# Patient Record
Sex: Female | Born: 1995 | Race: Black or African American | Hispanic: Yes | Smoking: Former smoker
Health system: Southern US, Community
[De-identification: ages and names within clinical notes are randomized; demographics above are authoritative.]

## PROBLEM LIST (undated history)

## (undated) DIAGNOSIS — F32A Depression, unspecified: Secondary | ICD-10-CM

## (undated) DIAGNOSIS — F329 Major depressive disorder, single episode, unspecified: Secondary | ICD-10-CM

## (undated) DIAGNOSIS — R87629 Unspecified abnormal cytological findings in specimens from vagina: Secondary | ICD-10-CM

## (undated) DIAGNOSIS — E119 Type 2 diabetes mellitus without complications: Secondary | ICD-10-CM

## (undated) DIAGNOSIS — R7303 Prediabetes: Secondary | ICD-10-CM

## (undated) HISTORY — PX: TONSILLECTOMY: SUR1361

## (undated) HISTORY — PX: ADENOIDECTOMY: SUR15

## (undated) HISTORY — DX: Unspecified abnormal cytological findings in specimens from vagina: R87.629

---
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
## 2017-06-01 ENCOUNTER — Encounter (HOSPITAL_COMMUNITY): Payer: Self-pay

## 2017-06-01 ENCOUNTER — Emergency Department (HOSPITAL_COMMUNITY)
Admission: EM | Admit: 2017-06-01 | Discharge: 2017-06-01 | Disposition: A | Discharge status: Home / Self Care | Payer: Medicaid Other | Attending: Emergency Medicine | Admitting: Emergency Medicine

## 2017-06-01 ENCOUNTER — Other Ambulatory Visit: Payer: Self-pay

## 2017-06-01 DIAGNOSIS — Z3202 Encounter for pregnancy test, result negative: Secondary | ICD-10-CM | POA: Insufficient documentation

## 2017-06-01 DIAGNOSIS — Z3200 Encounter for pregnancy test, result unknown: Secondary | ICD-10-CM

## 2017-06-01 LAB — I-STAT BETA HCG BLOOD, ED (MC, WL, AP ONLY): I-stat hCG, quantitative: <5 m[IU]/mL (ref <5)

## 2017-06-01 LAB — PREGNANCY, URINE: PREG TEST UR: NEGATIVE

## 2017-06-01 NOTE — ED Provider Notes (Signed)
[facility] EMERGENCY DEPARTMENT Provider Note   CSN: [id number] Arrival date & time: 06/01/17  1253     History   Chief Complaint Chief Complaint  Patient presents with  . Possible Pregnancy    HPI Leah Kennedy is a 21 y.o. female.  Patient is a 21 year old female who presents to the emergency department with a question of pregnancy.  Patient states that her cycle is about 11 days late.  She states she has taken pregnancy test that was positive from over-the-counter and a pregnancy test that was negative from over-the-counter so she came to the emergency department for evaluation.  She has not had any lower abdominal pain.  There is been no vaginal bleeding reported.  She states she has had some clear drainage from her vagina but no bleeding.      History reviewed. No pertinent past medical history.  There are no active problems to display for this patient.   History reviewed. No pertinent surgical history.  OB History    No data available       Home Medications    Prior to Admission medications   Not on File    Family History No family history on file.  Social History Social History   Tobacco Use  . Smoking status: Never Smoker  . Smokeless tobacco: Never Used  Substance Use Topics  . Alcohol use: No    Frequency: Never  . Drug use: No     Allergies   Patient has no known allergies.   Review of Systems Review of Systems  Constitutional: Negative for activity change.       All ROS Neg except as noted in HPI  HENT: Negative for nosebleeds.   Eyes: Negative for photophobia and discharge.  Respiratory: Negative for cough, shortness of breath and wheezing.   Cardiovascular: Negative for chest pain and palpitations.  Gastrointestinal: Positive for nausea. Negative for abdominal pain and blood in stool.  Genitourinary: Negative for dysuria, frequency and hematuria.  Musculoskeletal: Negative for arthralgias, back pain and neck pain.  Skin:  Negative.   Neurological: Negative for dizziness, seizures and speech difficulty.  Psychiatric/Behavioral: Negative for confusion and hallucinations.     Physical Exam Updated Vital Signs BP 139/84 (BP Location: Right Arm)   Pulse 94   Temp 97.8 F (36.6 C) (Oral)   Resp 15   Ht 5\' 6"  (1.676 m)   Wt 122.9 kg (271 lb)   LMP 04/24/2017   SpO2 98%   BMI 43.74 kg/m   Physical Exam  Constitutional: She is oriented to person, place, and time. She appears well-developed and well-nourished.  Non-toxic appearance.  HENT:  Head: Normocephalic.  Right Ear: Tympanic membrane and external ear normal.  Left Ear: Tympanic membrane and external ear normal.  Eyes: EOM and lids are normal. Pupils are equal, round, and reactive to light.  Neck: Normal range of motion. Neck supple. Carotid bruit is not present.  Cardiovascular: Normal rate, regular rhythm, normal heart sounds, intact distal pulses and normal pulses.  Pulmonary/Chest: Breath sounds normal. No respiratory distress.  Abdominal: Soft. Bowel sounds are normal. There is no tenderness. There is no guarding.  Musculoskeletal: Normal range of motion.  Lymphadenopathy:       Head (right side): No submandibular adenopathy present.       Head (left side): No submandibular adenopathy present.    She has no cervical adenopathy.  Neurological: She is alert and oriented to person, place, and time. She has normal strength. No  cranial nerve deficit or sensory deficit.  Skin: Skin is warm and dry.  Psychiatric: She has a normal mood and affect. Her speech is normal.  Nursing note and vitals reviewed.    ED Treatments / Results  Labs (all labs ordered are listed, but only abnormal results are displayed) Labs Reviewed  PREGNANCY, URINE    EKG  EKG Interpretation None       Radiology No results found.  Procedures Procedures (including critical care time)  Medications Ordered in ED Medications - No data to display   Initial  Impression / Assessment and Plan / ED Course  I have reviewed the triage vital signs and the nursing notes.  Pertinent labs & imaging results that were available during my care of the patient were reviewed by me and considered in my medical decision making (see chart for details).  Clinical Course as of Jun 01 1613  Fri Jun 01, 2017  1440 Preg Test, Ur: NEGATIVE [HM]    Clinical Course User Index [HM] Estill DoomsMcDonald, Hillary W, Wisconsintudent-PA      Final Clinical Impressions(s) / ED Diagnoses MDM Vital signs within normal limits.  Urine pregnancy test is negative.  Point-of-care beta hCG is negative.  I have informed the patient of negative pregnancy test.  She will follow-up with her GYN concerning the changes in her menstrual cycle.   Final diagnoses:  Encounter for pregnancy test, result unknown    ED Discharge Orders    None       Ivery QualeBryant, Laure Leone, PA-C 06/01/17 1617    Eber HongMiller, Brian, MD 06/02/17 [phone]

## 2017-06-01 NOTE — Discharge Instructions (Signed)
Your urine and blood pregnancy test are negative.

## 2017-06-01 NOTE — ED Triage Notes (Signed)
Patient reports of taking sseveral pregnancy test with positive and negative. States she wants to know if she is pregnant. Denies any complains. Last period was 04/24/17.

## 2017-07-24 ENCOUNTER — Other Ambulatory Visit: Payer: Self-pay

## 2017-07-24 ENCOUNTER — Encounter (HOSPITAL_COMMUNITY): Payer: Self-pay | Admitting: *Deleted

## 2017-07-24 ENCOUNTER — Emergency Department (HOSPITAL_COMMUNITY)
Admission: EM | Admit: 2017-07-24 | Discharge: 2017-07-24 | Disposition: A | Discharge status: Home / Self Care | Payer: Medicaid Other | Attending: Emergency Medicine | Admitting: Emergency Medicine

## 2017-07-24 DIAGNOSIS — Z5321 Procedure and treatment not carried out due to patient leaving prior to being seen by health care provider: Secondary | ICD-10-CM | POA: Insufficient documentation

## 2017-07-24 DIAGNOSIS — N938 Other specified abnormal uterine and vaginal bleeding: Secondary | ICD-10-CM | POA: Diagnosis present

## 2017-07-24 NOTE — ED Notes (Signed)
Pt not in waiting room

## 2017-07-24 NOTE — ED Triage Notes (Signed)
Vaginal bleeding, states she is [redacted] weeks pregnant

## 2017-07-24 NOTE — ED Notes (Signed)
No answer

## 2017-10-20 ENCOUNTER — Encounter (HOSPITAL_COMMUNITY): Payer: Self-pay | Admitting: Emergency Medicine

## 2017-10-20 ENCOUNTER — Emergency Department (HOSPITAL_COMMUNITY)
Admission: EM | Admit: 2017-10-20 | Discharge: 2017-10-20 | Disposition: A | Discharge status: Home / Self Care | Payer: Medicaid Other | Attending: Emergency Medicine | Admitting: Emergency Medicine

## 2017-10-20 ENCOUNTER — Other Ambulatory Visit: Payer: Self-pay

## 2017-10-20 ENCOUNTER — Emergency Department (HOSPITAL_COMMUNITY): Payer: Medicaid Other

## 2017-10-20 DIAGNOSIS — Y929 Unspecified place or not applicable: Secondary | ICD-10-CM | POA: Diagnosis not present

## 2017-10-20 DIAGNOSIS — S93401A Sprain of unspecified ligament of right ankle, initial encounter: Secondary | ICD-10-CM | POA: Insufficient documentation

## 2017-10-20 DIAGNOSIS — Y999 Unspecified external cause status: Secondary | ICD-10-CM | POA: Insufficient documentation

## 2017-10-20 DIAGNOSIS — S99911A Unspecified injury of right ankle, initial encounter: Secondary | ICD-10-CM | POA: Diagnosis present

## 2017-10-20 DIAGNOSIS — X509XXA Other and unspecified overexertion or strenuous movements or postures, initial encounter: Secondary | ICD-10-CM | POA: Diagnosis not present

## 2017-10-20 DIAGNOSIS — Y939 Activity, unspecified: Secondary | ICD-10-CM | POA: Diagnosis not present

## 2017-10-20 MED ORDER — IBUPROFEN 600 MG PO TABS: 600.0000 mg | ORAL_TABLET | Freq: Four times a day (QID) | ORAL | 0 refills | Status: DC | PRN

## 2017-10-20 NOTE — ED Triage Notes (Signed)
Patient states she rolled her right ankle today and is complaining of pain and swelling to area.

## 2017-10-20 NOTE — ED Provider Notes (Signed)
[facility] EMERGENCY DEPARTMENT Provider Note   CSN: [id number] Arrival date & time: 10/20/17  1502     History   Chief Complaint Chief Complaint  Patient presents with  . Ankle Pain    HPI Leah Kennedy is a 22 y.o. female.  HPI   Leah Kennedy is a 22 y.o. female who presents to the Emergency Department complaining of right ankle pain.  She describes a "twisting" of her right ankle that occurred earlier today.  She reports pain associated with weightbearing and movement.  She also notes gradual swelling to the lateral ankle.  She denies head injury, LOC, neck or back pain.  She has not tried any medications or therapies prior to arrival.   History reviewed. No pertinent past medical history.  There are no active problems to display for this patient.   Past Surgical History:  Procedure Laterality Date  . TONSILLECTOMY       OB History   None      Home Medications    Prior to Admission medications   Not on File    Family History History reviewed. No pertinent family history.  Social History Social History   Tobacco Use  . Smoking status: Never Smoker  . Smokeless tobacco: Never Used  Substance Use Topics  . Alcohol use: No    Frequency: Never  . Drug use: No     Allergies   Patient has no known allergies.   Review of Systems Review of Systems  Constitutional: Negative for chills and fever.  Genitourinary: Negative for difficulty urinating and dysuria.  Musculoskeletal: Positive for arthralgias (Right ankle pain and swelling) and joint swelling.  Skin: Negative for color change and wound.  All other systems reviewed and are negative.    Physical Exam Updated Vital Signs BP 104/78 (BP Location: Right Arm)   Pulse 93   Temp 98.5 F (36.9 C) (Oral)   Resp 18   Ht 5\' 6"  (1.676 m)   Wt 122.5 kg (270 lb)   LMP 09/12/2017   SpO2 100%   BMI 43.58 kg/m   Physical Exam  Constitutional: She is oriented to person, place, and time. She  appears well-developed and well-nourished. No distress.  HENT:  Head: Normocephalic and atraumatic.  Cardiovascular: Normal rate, regular rhythm and intact distal pulses.  Pulmonary/Chest: Effort normal and breath sounds normal.  Musculoskeletal: She exhibits edema and tenderness. She exhibits no deformity.  Tenderness to palpation of the lateral right ankle with mild edema noted.  No bony deformity.  No tenderness or edema proximally.  Achilles tendon appears intact  Neurological: She is alert and oriented to person, place, and time. No sensory deficit. She exhibits normal muscle tone. Coordination normal.  Skin: Skin is warm and dry. Capillary refill takes less than 2 seconds.  Psychiatric: She has a normal mood and affect.  Nursing note and vitals reviewed.    ED Treatments / Results  Labs (all labs ordered are listed, but only abnormal results are displayed) Labs Reviewed - No data to display  EKG None  Radiology Dg Ankle Complete Right  Result Date: 10/20/2017 CLINICAL DATA:  Right ankle pain and swelling after injury today. EXAM: RIGHT ANKLE - COMPLETE 3+ VIEW COMPARISON:  None. FINDINGS: There is no evidence of fracture, dislocation, or joint effusion. There is no evidence of arthropathy or other focal bone abnormality. Soft tissues are unremarkable. IMPRESSION: Normal right ankle. Electronically Signed   By: Lupita Raider, M.D.   On: 10/20/2017 15:32  Procedures Procedures (including critical care time)  Medications Ordered in ED Medications - No data to display   Initial Impression / Assessment and Plan / ED Course  I have reviewed the triage vital signs and the nursing notes.  Pertinent labs & imaging results that were available during my care of the patient were reviewed by me and considered in my medical decision making (see chart for details).     Patient with likely sprain of the right ankle.  Neurovascularly intact.  X-rays are reassuring.  ASO applied,  patient agrees to RICE therapy and orthopedic follow-up in 1 week if not improving.  Final Clinical Impressions(s) / ED Diagnoses   Final diagnoses:  Sprain of right ankle, unspecified ligament, initial encounter    ED Discharge Orders    None       Rosey Bathriplett, Courtland Reas, PA-C 10/20/17 1604    Linwood DibblesKnapp, Jon, MD 10/21/17 0000

## 2017-10-20 NOTE — Discharge Instructions (Addendum)
Elevate and apply ice packs on and off to your ankle.  Minimal weightbearing for 1 week.  Wear the brace for at least 1 week.  Follow-up with the orthopedic doctor listed if the pain is not improving.

## 2017-12-12 ENCOUNTER — Emergency Department (HOSPITAL_COMMUNITY)
Admission: EM | Admit: 2017-12-12 | Discharge: 2017-12-12 | Disposition: A | Discharge status: Home / Self Care | Payer: Medicaid Other | Attending: Emergency Medicine | Admitting: Emergency Medicine

## 2017-12-12 ENCOUNTER — Encounter (HOSPITAL_COMMUNITY): Payer: Self-pay | Admitting: *Deleted

## 2017-12-12 ENCOUNTER — Other Ambulatory Visit: Payer: Self-pay

## 2017-12-12 DIAGNOSIS — R7303 Prediabetes: Secondary | ICD-10-CM | POA: Diagnosis not present

## 2017-12-12 DIAGNOSIS — R350 Frequency of micturition: Secondary | ICD-10-CM | POA: Insufficient documentation

## 2017-12-12 HISTORY — DX: Type 2 diabetes mellitus without complications: E11.9

## 2017-12-12 HISTORY — DX: Major depressive disorder, single episode, unspecified: F32.9

## 2017-12-12 HISTORY — DX: Depression, unspecified: F32.A

## 2017-12-12 LAB — URINALYSIS, ROUTINE W REFLEX MICROSCOPIC
BILIRUBIN URINE: NEGATIVE
Glucose, UA: NEGATIVE mg/dL
HGB URINE DIPSTICK: NEGATIVE
KETONES UR: NEGATIVE mg/dL
NITRITE: NEGATIVE
PROTEIN: NEGATIVE mg/dL
SPECIFIC GRAVITY, URINE: 1.02 (ref 1.005–1.030)
pH: 7 (ref 5.0–8.0)

## 2017-12-12 LAB — CBG MONITORING, ED: Glucose-Capillary: 107 mg/dL — ABNORMAL HIGH (ref 65–99)

## 2017-12-12 LAB — PREGNANCY, URINE: Preg Test, Ur: NEGATIVE

## 2017-12-12 NOTE — ED Provider Notes (Addendum)
[facility] EMERGENCY DEPARTMENT Provider Note   CSN: [id number] Arrival date & time: 12/12/17  2038     History   Chief Complaint Chief Complaint  Patient presents with  . Labs Only    HPI Leah Kennedy is a 22 y.o. female.  She presents to the ED concerned that she may be a diabetic.  She has noticed over the past few weeks that she has had episodic fatigue, feeling hot and cold, and urinating frequently.  She has been told that she was prediabetic in the past.  She has not had a fever she does not have a cough or abdominal pain.  There is been no vaginal bleeding or discharge.  Her last menstrual period was about 2 weeks ago.  She denies any drugs or alcohol.  She states she has been more depressed since her grandfather was ill and passed last month she has a primary care doctor but has not been to see him for this.  The history is provided by the patient.  Female GU Problem  This is a new problem. The problem occurs hourly. The problem has not changed since onset.Pertinent negatives include no chest pain, no abdominal pain, no headaches and no shortness of breath. Nothing aggravates the symptoms. Nothing relieves the symptoms. She has tried nothing for the symptoms. The treatment provided no relief.    Past Medical History:  Diagnosis Date  . Depression   . Diabetes mellitus without complication (HCC)    pre-diabetes    There are no active problems to display for this patient.   Past Surgical History:  Procedure Laterality Date  . TONSILLECTOMY       OB History   None      Home Medications    Prior to Admission medications   Medication Sig Start Date End Date Taking? Authorizing Provider  ibuprofen (ADVIL,MOTRIN) 600 MG tablet Take 1 tablet (600 mg total) by mouth every 6 (six) hours as needed. Take with food 10/20/17   Triplett, Tammy, PA-C    Family History No family history on file.  Social History Social History   Tobacco Use  . Smoking status: Never  Smoker  . Smokeless tobacco: Never Used  Substance Use Topics  . Alcohol use: No    Frequency: Never  . Drug use: No     Allergies   Patient has no known allergies.   Review of Systems Review of Systems  Constitutional: Positive for fatigue. Negative for fever.  HENT: Negative for sore throat.   Eyes: Negative for visual disturbance.  Respiratory: Negative for shortness of breath.   Cardiovascular: Negative for chest pain.  Gastrointestinal: Negative for abdominal pain.  Endocrine: Positive for polydipsia and polyuria.  Genitourinary: Positive for frequency. Negative for dysuria.  Musculoskeletal: Negative for gait problem.  Skin: Negative for rash.  Neurological: Negative for headaches.     Physical Exam Updated Vital Signs BP 123/77 (BP Location: Right Arm)   Pulse (!) 106   Temp 98.4 F (36.9 C) (Oral)   Resp 18   Ht  (1.676 m)   Wt 132.5 kg (292 lb)   LMP 11/26/2017   SpO2 100%   BMI 47.13 kg/m   Physical Exam  Constitutional: She appears well-developed and well-nourished. No distress.  HENT:  Head: Normocephalic and atraumatic.  Eyes: Conjunctivae are normal.  Neck: Neck supple.  Cardiovascular: Normal rate and regular rhythm.  No murmur heard. Pulmonary/Chest: Effort normal and breath sounds normal. No respiratory distress.  Abdominal:  Soft. There is no tenderness.  Musculoskeletal: Normal range of motion. She exhibits no edema or deformity.  Neurological: She is alert. She has normal strength. Gait normal. GCS eye subscore is 4. GCS verbal subscore is 5. GCS motor subscore is 6.  Skin: Skin is warm and dry.  Psychiatric: She has a normal mood and affect.  Nursing note and vitals reviewed.    ED Treatments / Results  Labs (all labs ordered are listed, but only abnormal results are displayed) Labs Reviewed  URINALYSIS, ROUTINE W REFLEX MICROSCOPIC - Abnormal; Notable for the following components:      Result Value   Leukocytes, UA TRACE (*)     Bacteria, UA RARE (*)    All other components within normal limits  CBG MONITORING, ED - Abnormal; Notable for the following components:   Glucose-Capillary 107 (*)    All other components within normal limits  PREGNANCY, URINE  CBG MONITORING, ED    EKG None  Radiology No results found.  Procedures Procedures (including critical care time)  Medications Ordered in ED Medications - No data to display   Initial Impression / Assessment and Plan / ED Course  I have reviewed the triage vital signs and the nursing notes.  Pertinent labs & imaging results that were available during my care of the patient were reviewed by me and considered in my medical decision making (see chart for details).  Clinical Course as of Dec 14 42  Wed Dec 12, 2017  2111 Patient's fingerstick blood sugar here was 107 and so doubtful that she is has new onset diabetes.  She is going to give [place] a urine sample so we can check a UA and UCG.  She denies any vaginal bleeding or discharge and she does not have any concerns for STD.  I did review with her that if her work-up is unremarkable here she should follow-up with her primary care doctor because they may need to do more testing and there may be some elements of a mood disorder to this.   [MB]    Clinical Course User Index [MB] Terrilee Files, MD     Final Clinical Impressions(s) / ED Diagnoses   Final diagnoses:  Urinary frequency    ED Discharge Orders    None       Terrilee Files, MD 12/13/17 1610    Terrilee Files, MD 12/24/17 [phone]

## 2017-12-12 NOTE — Discharge Instructions (Addendum)
You were evaluated in the emergency department for feeling hot and cold and frequent urination.  We checked her blood sugar and there is no evidence that this is high blood sugars like diabetes and your urine did not show any obvious signs of infection.  You will need to contact your primary care doctor to follow-up with them and please return to the emergency department if any worsening symptoms.

## 2017-12-12 NOTE — ED Triage Notes (Addendum)
Pt here to have her cbg checked. Pt concerned she is "full blown" diabetic. Pt states she has been told she is pre-diabetic. Pt state she has been fatigued.

## 2018-05-18 ENCOUNTER — Emergency Department (HOSPITAL_COMMUNITY)
Admission: EM | Admit: 2018-05-18 | Discharge: 2018-05-18 | Disposition: A | Discharge status: Home / Self Care | Payer: Medicaid Other | Attending: Emergency Medicine | Admitting: Emergency Medicine

## 2018-05-18 ENCOUNTER — Encounter (HOSPITAL_COMMUNITY): Payer: Self-pay | Admitting: Emergency Medicine

## 2018-05-18 ENCOUNTER — Emergency Department (HOSPITAL_COMMUNITY): Payer: Medicaid Other

## 2018-05-18 DIAGNOSIS — Y92328 Other athletic field as the place of occurrence of the external cause: Secondary | ICD-10-CM | POA: Diagnosis not present

## 2018-05-18 DIAGNOSIS — Z3201 Encounter for pregnancy test, result positive: Secondary | ICD-10-CM | POA: Diagnosis not present

## 2018-05-18 DIAGNOSIS — Y9302 Activity, running: Secondary | ICD-10-CM | POA: Diagnosis not present

## 2018-05-18 DIAGNOSIS — R7303 Prediabetes: Secondary | ICD-10-CM | POA: Insufficient documentation

## 2018-05-18 DIAGNOSIS — X500XXA Overexertion from strenuous movement or load, initial encounter: Secondary | ICD-10-CM | POA: Diagnosis not present

## 2018-05-18 DIAGNOSIS — S99922A Unspecified injury of left foot, initial encounter: Secondary | ICD-10-CM | POA: Diagnosis present

## 2018-05-18 DIAGNOSIS — Y999 Unspecified external cause status: Secondary | ICD-10-CM | POA: Insufficient documentation

## 2018-05-18 DIAGNOSIS — S9032XA Contusion of left foot, initial encounter: Secondary | ICD-10-CM | POA: Insufficient documentation

## 2018-05-18 DIAGNOSIS — M79672 Pain in left foot: Secondary | ICD-10-CM | POA: Diagnosis not present

## 2018-05-18 LAB — HCG, QUANTITATIVE, PREGNANCY: HCG, BETA CHAIN, QUANT, S: 388 m[IU]/mL — AB (ref <5)

## 2018-05-18 NOTE — ED Notes (Signed)
KS in to speak with pt re findings

## 2018-05-18 NOTE — ED Notes (Signed)
From Rad 

## 2018-05-18 NOTE — Discharge Instructions (Addendum)
Schedule appointment for repeat Qbhcg this week.  Your pregnancy hormone is low.  This could mean that you are very early pregnant or that there is a problem with this pregnancy.

## 2018-05-18 NOTE — ED Notes (Signed)
Call to Rad  They are enroute

## 2018-05-18 NOTE — ED Triage Notes (Signed)
Pt [place] and hurting her [place] foot.  Also has had 5 positive home pregnancy tests and wants to confirm.

## 2018-05-18 NOTE — ED Notes (Signed)
Pt running and states hurt her L foot  Also here to confirm pregnancy after 5 positive home preg test

## 2018-05-19 NOTE — ED Provider Notes (Signed)
[facility] EMERGENCY DEPARTMENT Provider Note   CSN: [id number] Arrival date & time: 05/18/18  1823     History   Chief Complaint Chief Complaint  Patient presents with  . Foot Pain    HPI Leah Kennedy is a 22 y.o. female.  The history is provided by the patient. No language interpreter was used.  Foot Pain  This is a new problem. The current episode started 6 to 12 hours ago. The problem occurs constantly. The problem has been gradually worsening. Nothing aggravates the symptoms. Nothing relieves the symptoms. She has tried nothing for the symptoms.  Pt reports she was running and turned her foot.  Pt also thinks she may be pregannt.  She request a test  Past Medical History:  Diagnosis Date  . Depression   . Diabetes mellitus without complication (HCC)    pre-diabetes    There are no active problems to display for this patient.   Past Surgical History:  Procedure Laterality Date  . TONSILLECTOMY       OB History   None      Home Medications    Prior to Admission medications   Medication Sig Start Date End Date Taking? Authorizing Provider  ibuprofen (ADVIL,MOTRIN) 600 MG tablet Take 1 tablet (600 mg total) by mouth every 6 (six) hours as needed. Take with food 10/20/17   Pauline Aus, PA-C    Family History History reviewed. No pertinent family history.  Social History Social History   Tobacco Use  . Smoking status: Never Smoker  . Smokeless tobacco: Never Used  Substance Use Topics  . Alcohol use: No    Frequency: Never  . Drug use: Yes    Types: Marijuana    Comment: occasional     Allergies   Patient has no known allergies.   Review of Systems Review of Systems  Musculoskeletal: Positive for joint swelling.  All other systems reviewed and are negative.    Physical Exam Updated Vital Signs BP (!) 103/58 (BP Location: Left Arm)   Pulse 99   Temp 98.3 F (36.8 C) (Oral)   Resp 18   LMP 03/29/2018   SpO2 99%   Physical  Exam  Constitutional: She appears well-developed and well-nourished.  Cardiovascular:  Tender left foot, pain with range of motion  Ns nad nv intact  Musculoskeletal: She exhibits tenderness.  Neurological: She is alert.  Skin: Skin is warm.  Psychiatric: She has a normal mood and affect.  Nursing note and vitals reviewed.    ED Treatments / Results  Labs (all labs ordered are listed, but only abnormal results are displayed) Labs Reviewed  HCG, QUANTITATIVE, PREGNANCY - Abnormal; Notable for the following components:      Result Value   hCG, Beta Chain, Quant, S 388 (*)    All other components within normal limits    EKG None  Radiology Dg Foot Complete Left  Result Date: 05/18/2018 CLINICAL DATA:  Foot pain after running a race. EXAM: LEFT FOOT - COMPLETE 3+ VIEW COMPARISON:  None. FINDINGS: There is no evidence of fracture or dislocation. There is no evidence of arthropathy or other focal bone abnormality. Soft tissues are unremarkable. IMPRESSION: No fracture or dislocation of the left foot. Electronically Signed   By: Deatra Robinson M.D.   On: 05/18/2018 21:37    Procedures Procedures (including critical care time)  Medications Ordered in ED Medications - No data to display   Initial Impression / Assessment and Plan / ED Course  I have reviewed the triage vital signs and the nursing notes.  Pertinent labs & imaging results that were available during my care of the patient were reviewed by me and considered in my medical decision making (see chart for details).     Pt had quan hcg .  Pt counseled on positive result.  Pt advised of need for repeat test.  [facility]  Pt advised 48 hour recheck.  Pt counseled on xray results  Final Clinical Impressions(s) / ED Diagnoses   Final diagnoses:  Contusion of left foot, initial encounter    ED Discharge Orders    None       Osie Cheeks 05/19/18 0136    Donnetta Hutching, MD 05/19/18  1546

## 2018-05-20 LAB — POC URINE PREG, ED: Preg Test, Ur: POSITIVE — AB

## 2018-05-28 ENCOUNTER — Encounter: Payer: Self-pay | Admitting: Adult Health

## 2018-06-04 ENCOUNTER — Other Ambulatory Visit: Payer: Self-pay | Admitting: Obstetrics & Gynecology

## 2018-06-04 DIAGNOSIS — O3680X0 Pregnancy with inconclusive fetal viability, not applicable or unspecified: Secondary | ICD-10-CM

## 2018-06-07 ENCOUNTER — Ambulatory Visit (INDEPENDENT_AMBULATORY_CARE_PROVIDER_SITE_OTHER): Payer: Medicaid Other

## 2018-06-07 ENCOUNTER — Other Ambulatory Visit: Payer: Self-pay

## 2018-06-07 DIAGNOSIS — O3680X0 Pregnancy with inconclusive fetal viability, not applicable or unspecified: Secondary | ICD-10-CM

## 2018-06-07 NOTE — Progress Notes (Addendum)
US 7+3 wks,single IUP w/ys,positive fht 137 bpm,normal left ovary,simple right ovarian exophytic cyst 2.2 x1.6 x 1.9 cm,crl 12.26 mm

## 2018-06-30 ENCOUNTER — Other Ambulatory Visit: Payer: Self-pay

## 2018-06-30 ENCOUNTER — Emergency Department (HOSPITAL_COMMUNITY)
Admission: EM | Admit: 2018-06-30 | Discharge: 2018-06-30 | Disposition: A | Discharge status: Home / Self Care | Payer: Medicaid Other | Attending: Emergency Medicine | Admitting: Emergency Medicine

## 2018-06-30 ENCOUNTER — Encounter (HOSPITAL_COMMUNITY): Payer: Self-pay | Admitting: Emergency Medicine

## 2018-06-30 DIAGNOSIS — R1084 Generalized abdominal pain: Secondary | ICD-10-CM | POA: Diagnosis not present

## 2018-06-30 DIAGNOSIS — Z5321 Procedure and treatment not carried out due to patient leaving prior to being seen by health care provider: Secondary | ICD-10-CM | POA: Diagnosis not present

## 2018-06-30 NOTE — ED Triage Notes (Signed)
Pt c/o sharp intermittent abd pain with nausea and dizziness, pt [redacted] wks pregnant, denies d/v/fever

## 2018-06-30 NOTE — ED Notes (Signed)
Per registration pt LWBS

## 2018-07-01 ENCOUNTER — Ambulatory Visit: Payer: Medicaid Other | Admitting: *Deleted

## 2018-07-01 ENCOUNTER — Ambulatory Visit (INDEPENDENT_AMBULATORY_CARE_PROVIDER_SITE_OTHER): Payer: Medicaid Other | Admitting: Women's Health

## 2018-07-01 ENCOUNTER — Encounter (INDEPENDENT_AMBULATORY_CARE_PROVIDER_SITE_OTHER): Payer: Self-pay

## 2018-07-01 ENCOUNTER — Encounter: Payer: Self-pay | Admitting: Women's Health

## 2018-07-01 VITALS — BP 114/80 | HR 70 | Wt 281.0 lb

## 2018-07-01 DIAGNOSIS — R7303 Prediabetes: Secondary | ICD-10-CM | POA: Diagnosis not present

## 2018-07-01 DIAGNOSIS — Z23 Encounter for immunization: Secondary | ICD-10-CM | POA: Diagnosis not present

## 2018-07-01 DIAGNOSIS — Z3A10 10 weeks gestation of pregnancy: Secondary | ICD-10-CM

## 2018-07-01 DIAGNOSIS — Z3401 Encounter for supervision of normal first pregnancy, first trimester: Secondary | ICD-10-CM

## 2018-07-01 DIAGNOSIS — Z331 Pregnant state, incidental: Secondary | ICD-10-CM

## 2018-07-01 DIAGNOSIS — Z3481 Encounter for supervision of other normal pregnancy, first trimester: Secondary | ICD-10-CM

## 2018-07-01 DIAGNOSIS — Z3490 Encounter for supervision of normal pregnancy, unspecified, unspecified trimester: Secondary | ICD-10-CM | POA: Insufficient documentation

## 2018-07-01 DIAGNOSIS — Z3682 Encounter for antenatal screening for nuchal translucency: Secondary | ICD-10-CM

## 2018-07-01 DIAGNOSIS — Z1389 Encounter for screening for other disorder: Secondary | ICD-10-CM

## 2018-07-01 LAB — POCT URINALYSIS DIPSTICK OB
Blood, UA: NEGATIVE
GLUCOSE, UA: NEGATIVE
Leukocytes, UA: NEGATIVE
Nitrite, UA: NEGATIVE

## 2018-07-01 NOTE — Progress Notes (Signed)
INITIAL OBSTETRICAL VISIT Patient name: Leah Kennedy MRN [id number]  Date of birth: [birth year] Chief Complaint:   Initial Prenatal Visit  History of Present Illness:   Leah Kennedy is a 22 y.o. G21P0010 African American female at [redacted]w[redacted]d by 7wk u/s, with an Estimated Date of Delivery: 01/21/19 being seen today for her initial obstetrical visit.   Her obstetrical history is significant for she thinks she had a miscarriage earlier this year, never took pregnancy test, but was hurting all day, passed golf-ball sized sac that was firm- did not look like a blood clot- send pic to her gma who said it was miscarriage.   Today she reports no complaints.  H/O dep/anx- no meds x years, doing well H/O pre-diabetes Patient's last menstrual period was 03/29/2018. Last pap <21yo. Results were: n/a Review of Systems:   Pertinent items are noted in HPI Denies cramping/contractions, leakage of fluid, vaginal bleeding, abnormal vaginal discharge w/ itching/odor/irritation, headaches, visual changes, shortness of breath, chest pain, abdominal pain, severe nausea/vomiting, or problems with urination or bowel movements unless otherwise stated above.  Pertinent History Reviewed:  Reviewed past medical,surgical, social, obstetrical and family history.  Reviewed problem list, medications and allergies. OB History  Gravida Para Term Preterm AB Living  2       1    SAB TAB Ectopic Multiple Live Births  1            # Outcome Date GA Lbr Len/2nd Weight Sex Delivery Anes PTL Lv  2 Current           1 SAB 05/2017           Physical Assessment:   Vitals:   07/01/18 1432  BP: 114/80  Pulse: 70  Weight: 281 lb (127.5 kg)  Body mass index is 45.35 kg/m.       Physical Examination:  General appearance - well appearing, and in no distress  Mental status - alert, oriented to person, place, and time  Psych:  She has a normal mood and affect  Skin - warm and dry, normal color, no suspicious lesions  noted  Chest - effort normal, all lung fields clear to auscultation bilaterally  Heart - normal rate and regular rhythm  Abdomen - soft, nontender  Extremities:  No swelling or varicosities noted  Thin prep pap is not done   Fetal Heart Rate (bpm): 160 via doppler  No results found for this or any previous visit (from the past 24 hour(s)).  Assessment & Plan:  1) Low-Risk Pregnancy G2P0010 at [redacted]w[redacted]d with an Estimated Date of Delivery: 01/21/19   2) Initial OB visit  3) Dep/anx> no meds, doing well  4) Pre-diabetes> A1C today  Meds: No orders of the defined types were placed in this encounter.  Initial labs obtained, including A1C Continue prenatal vitamins Reviewed n/v relief measures and warning s/s to report Reviewed recommended weight gain based on pre-gravid BMI Encouraged well-balanced diet Genetic Screening discussed Integrated Screen: requested Cystic fibrosis screening discussed requested Ultrasound discussed; fetal survey: requested CCNC completed  Follow-up: Return in about 17 days (around 07/18/2018) for LROB, US:NT+1stIT.   Orders Placed This Encounter  Procedures  . GC/Chlamydia Probe Amp  . Urine Culture  . US Fetal Nuchal Translucency Measurement  . Obstetric Panel, Including HIV  . Urinalysis, Routine w reflex microscopic  . Pain Management Screening Profile (10S)  . Cystic Fibrosis Mutation 97  . Hemoglobin A1c  . POC Urinalysis Dipstick OB    Merlene Laughter  Cedric FishmanBooker CNM, [facility]NP-BC 07/01/2018 3:26 PM

## 2018-07-01 NOTE — Patient Instructions (Signed)
Konrad SahaAureana Kooi, I greatly value your feedback.  If you receive a survey following your visit with us today, we appreciate you taking the time to fill it out.  Thanks, Joellyn HaffKim Booker, CNM, WHNP-BC   Nausea & Vomiting  Have saltine crackers or pretzels by your bed and eat a few bites before you raise your head out of bed in the morning  Eat small frequent meals throughout the day instead of large meals  Drink plenty of fluids throughout the day to stay hydrated, just don't drink a lot of fluids with your meals.  This can make your stomach fill up faster making you feel sick  Do not brush your teeth right after you eat  Products with real ginger are good for nausea, like ginger ale and ginger hard candy Make sure it says made with real ginger!  Sucking on sour candy like lemon heads is also good for nausea  If your prenatal vitamins make you nauseated, take them at night so you will sleep through the nausea  Sea Bands  If you feel like you need medicine for the nausea & vomiting please let us know  If you are unable to keep any fluids or food down please let us know   Constipation  Drink plenty of fluid, preferably water, throughout the day  Eat foods high in fiber such as fruits, vegetables, and grains  Exercise, such as walking, is a good way to keep your bowels regular  Drink warm fluids, especially warm prune juice, or decaf coffee  Eat a 1/2 cup of real oatmeal (not instant), 1/2 cup applesauce, and 1/2-1 cup warm prune juice every day  If needed, you may take Colace (docusate sodium) stool softener once or twice a day to help keep the stool soft. If you are pregnant, wait until you are out of your first trimester (12-14 weeks of pregnancy)  If you still are having problems with constipation, you may take Miralax once daily as needed to help keep your bowels regular.  If you are pregnant, wait until you are out of your first trimester (12-14 weeks of pregnancy)   First  Trimester of Pregnancy The first trimester of pregnancy is from week 1 until the end of week 12 (months 1 through 3). A week after a sperm fertilizes an egg, the egg will implant on the wall of the uterus. This embryo will begin to develop into a baby. Genes from you and your partner are forming the baby. The female genes determine whether the baby is a boy or a girl. At 6-8 weeks, the eyes and face are formed, and the heartbeat can be seen on ultrasound. At the end of 12 weeks, all the baby's organs are formed.  Now that you are pregnant, you will want to do everything you can to have a healthy baby. Two of the most important things are to get good prenatal care and to follow your health care provider's instructions. Prenatal care is all the medical care you receive before the baby's birth. This care will help prevent, find, and treat any problems during the pregnancy and childbirth. BODY CHANGES Your body goes through many changes during pregnancy. The changes vary from woman to woman.   You may gain or lose a couple of pounds at first.  You may feel sick to your stomach (nauseous) and throw up (vomit). If the vomiting is uncontrollable, call your health care provider.  You may tire easily.  You may develop headaches that  can be relieved by medicines approved by your health care provider.  You may urinate more often. Painful urination may mean you have a bladder infection.  You may develop heartburn as a result of your pregnancy.  You may develop constipation because certain hormones are causing the muscles that push waste through your intestines to slow down.  You may develop hemorrhoids or swollen, bulging veins (varicose veins).  Your breasts may begin to grow larger and become tender. Your nipples may stick out more, and the tissue that surrounds them (areola) may become darker.  Your gums may bleed and may be sensitive to brushing and flossing.  Dark spots or blotches (chloasma, mask  of pregnancy) may develop on your face. This will likely fade after the baby is born.  Your menstrual periods will stop.  You may have a loss of appetite.  You may develop cravings for certain kinds of food.  You may have changes in your emotions from day to day, such as being excited to be pregnant or being concerned that something may go wrong with the pregnancy and baby.  You may have more vivid and strange dreams.  You may have changes in your hair. These can include thickening of your hair, rapid growth, and changes in texture. Some women also have hair loss during or after pregnancy, or hair that feels dry or thin. Your hair will most likely return to normal after your baby is born. WHAT TO EXPECT AT YOUR PRENATAL VISITS During a routine prenatal visit:  You will be weighed to make sure you and the baby are growing normally.  Your blood pressure will be taken.  Your abdomen will be measured to track your baby's growth.  The fetal heartbeat will be listened to starting around week 10 or 12 of your pregnancy.  Test results from any previous visits will be discussed. Your health care provider may ask you:  How you are feeling.  If you are feeling the baby move.  If you have had any abnormal symptoms, such as leaking fluid, bleeding, severe headaches, or abdominal cramping.  If you have any questions. Other tests that may be performed during your first trimester include:  Blood tests to find your blood type and to check for the presence of any previous infections. They will also be used to check for low iron levels (anemia) and Rh antibodies. Later in the pregnancy, blood tests for diabetes will be done along with other tests if problems develop.  Urine tests to check for infections, diabetes, or protein in the urine.  An ultrasound to confirm the proper growth and development of the baby.  An amniocentesis to check for possible genetic problems.  Fetal screens for spina  bifida and Down syndrome.  You may need other tests to make sure you and the baby are doing well. HOME CARE INSTRUCTIONS  Medicines  Follow your health care provider's instructions regarding medicine use. Specific medicines may be either safe or unsafe to take during pregnancy.  Take your prenatal vitamins as directed.  If you develop constipation, try taking a stool softener if your health care provider approves. Diet  Eat regular, well-balanced meals. Choose a variety of foods, such as meat or vegetable-based protein, fish, milk and low-fat dairy products, vegetables, fruits, and whole grain breads and cereals. Your health care provider will help you determine the amount of weight gain that is right for you.  Avoid raw meat and uncooked cheese. These carry germs that can cause  birth defects in the baby.  Eating four or five small meals rather than three large meals a day may help relieve nausea and vomiting. If you start to feel nauseous, eating a few soda crackers can be helpful. Drinking liquids between meals instead of during meals also seems to help nausea and vomiting.  If you develop constipation, eat more high-fiber foods, such as fresh vegetables or fruit and whole grains. Drink enough fluids to keep your urine clear or pale yellow. Activity and Exercise  Exercise only as directed by your health care provider. Exercising will help you:  Control your weight.  Stay in shape.  Be prepared for labor and delivery.  Experiencing pain or cramping in the lower abdomen or low back is a good sign that you should stop exercising. Check with your health care provider before continuing normal exercises.  Try to avoid standing for long periods of time. Move your legs often if you must stand in one place for a long time.  Avoid heavy lifting.  Wear low-heeled shoes, and practice good posture.  You may continue to have sex unless your health care provider directs you  otherwise. Relief of Pain or Discomfort  Wear a good support bra for breast tenderness.   Take warm sitz baths to soothe any pain or discomfort caused by hemorrhoids. Use hemorrhoid cream if your health care provider approves.   Rest with your legs elevated if you have leg cramps or low back pain.  If you develop varicose veins in your legs, wear support hose. Elevate your feet for 15 minutes, 3-4 times a day. Limit salt in your diet. Prenatal Care  Schedule your prenatal visits by the twelfth week of pregnancy. They are usually scheduled monthly at first, then more often in the last 2 months before delivery.  Write down your questions. Take them to your prenatal visits.  Keep all your prenatal visits as directed by your health care provider. Safety  Wear your seat belt at all times when driving.  Make a list of emergency phone numbers, including numbers for family, friends, the hospital, and police and fire departments. General Tips  Ask your health care provider for a referral to a local prenatal education class. Begin classes no later than at the beginning of month 6 of your pregnancy.  Ask for help if you have counseling or nutritional needs during pregnancy. Your health care provider can offer advice or refer you to specialists for help with various needs.  Do not use hot tubs, steam rooms, or saunas.  Do not douche or use tampons or scented sanitary pads.  Do not cross your legs for long periods of time.  Avoid cat litter boxes and soil used by cats. These carry germs that can cause birth defects in the baby and possibly loss of the fetus by miscarriage or stillbirth.  Avoid all smoking, herbs, alcohol, and medicines not prescribed by your health care provider. Chemicals in these affect the formation and growth of the baby.  Schedule a dentist appointment. At home, brush your teeth with a soft toothbrush and be gentle when you floss. SEEK MEDICAL CARE IF:   You have  dizziness.  You have mild pelvic cramps, pelvic pressure, or nagging pain in the abdominal area.  You have persistent nausea, vomiting, or diarrhea.  You have a bad smelling vaginal discharge.  You have pain with urination.  You notice increased swelling in your face, hands, legs, or ankles. SEEK IMMEDIATE MEDICAL CARE IF:  You have a fever.  You are leaking fluid from your vagina.  You have spotting or bleeding from your vagina.  You have severe abdominal cramping or pain.  You have rapid weight gain or loss.  You vomit blood or material that looks like coffee grounds.  You are exposed to [place] measles and have never had them.  You are exposed to fifth disease or chickenpox.  You develop a severe headache.  You have shortness of breath.  You have any kind of trauma, such as from a fall or a car accident. Document Released: 07/04/2001 Document Revised: 11/24/2013 Document Reviewed: 05/20/2013 [facility] Patient Information 2015 [place], [place]. This information is not intended to replace advice given to you by your health care provider. Make sure you discuss any questions you have with your health care provider.

## 2018-07-02 ENCOUNTER — Encounter: Payer: Self-pay | Admitting: Women's Health

## 2018-07-02 DIAGNOSIS — R7303 Prediabetes: Secondary | ICD-10-CM | POA: Insufficient documentation

## 2018-07-02 LAB — OBSTETRIC PANEL, INCLUDING HIV
Antibody Screen: NEGATIVE
BASOS ABS: 0 10*3/uL (ref 0.0–0.2)
Basos: 0 %
EOS (ABSOLUTE): 0.1 10*3/uL (ref 0.0–0.4)
EOS: 1 %
HEP B S AG: NEGATIVE
HIV Screen 4th Generation wRfx: NONREACTIVE
Hematocrit: 33.7 % — ABNORMAL LOW (ref 34.0–46.6)
Hemoglobin: 11.6 g/dL (ref 11.1–15.9)
IMMATURE GRANULOCYTES: 0 %
Immature Grans (Abs): 0 10*3/uL (ref 0.0–0.1)
Lymphocytes Absolute: 2.7 10*3/uL (ref 0.7–3.1)
Lymphs: 26 %
MCH: 28.4 pg (ref 26.6–33.0)
MCHC: 34.4 g/dL (ref 31.5–35.7)
MCV: 83 fL (ref 79–97)
MONOCYTES: 6 %
Monocytes Absolute: 0.7 10*3/uL (ref 0.1–0.9)
NEUTROS ABS: 7.1 10*3/uL — AB (ref 1.4–7.0)
NEUTROS PCT: 67 %
PLATELETS: 302 10*3/uL (ref 150–450)
RBC: 4.08 x10E6/uL (ref 3.77–5.28)
RDW: 13.6 % (ref 12.3–15.4)
RH TYPE: POSITIVE
RPR: NONREACTIVE
RUBELLA: 1 {index} (ref >0.99)
WBC: 10.7 10*3/uL (ref 3.4–10.8)

## 2018-07-02 LAB — URINALYSIS, ROUTINE W REFLEX MICROSCOPIC
BILIRUBIN UA: NEGATIVE
GLUCOSE, UA: NEGATIVE
Nitrite, UA: NEGATIVE
PH UA: 6.5 (ref 5.0–7.5)
RBC UA: NEGATIVE
Specific Gravity, UA: >=1.03 — AB (ref 1.005–1.030)
UUROB: 1 mg/dL (ref 0.2–1.0)

## 2018-07-02 LAB — MICROSCOPIC EXAMINATION: Casts: NONE SEEN /lpf

## 2018-07-02 LAB — HEMOGLOBIN A1C
ESTIMATED AVERAGE GLUCOSE: 105 mg/dL
HEMOGLOBIN A1C: 5.3 % (ref 4.8–5.6)

## 2018-07-06 LAB — CYSTIC FIBROSIS MUTATION 97: GENE DIS ANAL CARRIER INTERP BLD/T-IMP: NOT DETECTED

## 2018-07-11 ENCOUNTER — Ambulatory Visit (INDEPENDENT_AMBULATORY_CARE_PROVIDER_SITE_OTHER): Payer: Medicaid Other

## 2018-07-11 ENCOUNTER — Ambulatory Visit (INDEPENDENT_AMBULATORY_CARE_PROVIDER_SITE_OTHER): Payer: Medicaid Other | Admitting: Obstetrics and Gynecology

## 2018-07-11 VITALS — BP 116/78 | HR 74 | Wt 280.0 lb

## 2018-07-11 DIAGNOSIS — Z3A12 12 weeks gestation of pregnancy: Secondary | ICD-10-CM

## 2018-07-11 DIAGNOSIS — Z3481 Encounter for supervision of other normal pregnancy, first trimester: Secondary | ICD-10-CM

## 2018-07-11 DIAGNOSIS — Z3682 Encounter for antenatal screening for nuchal translucency: Secondary | ICD-10-CM

## 2018-07-11 DIAGNOSIS — O3663X0 Maternal care for excessive fetal growth, third trimester, not applicable or unspecified: Secondary | ICD-10-CM

## 2018-07-11 DIAGNOSIS — Z3401 Encounter for supervision of normal first pregnancy, first trimester: Secondary | ICD-10-CM | POA: Diagnosis not present

## 2018-07-11 DIAGNOSIS — Z331 Pregnant state, incidental: Secondary | ICD-10-CM

## 2018-07-11 DIAGNOSIS — Z3A10 10 weeks gestation of pregnancy: Secondary | ICD-10-CM | POA: Diagnosis not present

## 2018-07-11 NOTE — Progress Notes (Signed)
Patient ID: Konrad Sahaureana Rovner, female   DOB: [birth year], 22 y.o.   MRN: [id number]    LOW-RISK PREGNANCY VISIT Patient name: Konrad Sahaureana Kenyon MRN [id number]  Date of birth: [birth year] Chief Complaint:   No chief complaint on file.  History of Present Illness:   Konrad Sahaureana Kau is a 22 y.o. 712P0010 female at 3997w2d with an Estimated Date of Delivery: 01/21/19 being seen today for ongoing management of a low-risk pregnancy.  Today she reports no complaints.  .  .   .Actions; she alleges she had lots of nausea in the first 2 months of pregnancy and lost weight which she believes went down to 240 pounds.  I find this unlikely.  She is now gained weight back to her baseline weight of 280 denies-reports:} No leaking of fluid. Review of Systems:   Pertinent items are noted in HPI Denies abnormal vaginal discharge w/ itching/odor/irritation, headaches, visual changes, shortness of breath, chest pain, abdominal pain, severe nausea/vomiting, or problems with urination or bowel movements unless otherwise stated above. Pertinent History Reviewed:  Reviewed past medical,surgical, social, obstetrical and family history.  Reviewed problem list, medications and allergies. Physical Assessment:  There were no vitals filed for this visit.There is no height or weight on file to calculate BMI.        Physical Examination: BP 116/78   Pulse 74   Wt 280 lb (127 kg)   LMP 03/29/2018   BMI 45.19 kg/m    General appearance: Well appearing, and in no distress  Mental status: Alert, oriented to person, place, and time  Skin: Warm & dry  Cardiovascular: Normal heart rate noted  Respiratory: Normal respiratory effort, no distress  Abdomen: Soft, gravid, nontender  Pelvic: Cervical exam deferred         Extremities:    Fetal Status:          No results found for this or any previous visit (from the past 24 hour(s)).  Assessment & Plan:  1) Low-risk pregnancy G2P0010 at 5097w2d with an Estimated Date of Delivery: 01/21/19     Meds: No orders of the defined types were placed in this encounter.  Labs/procedures today: 1ITs,   Plan:   F/u in 4 weeks for 2nd Its   Follow-up: No follow-ups on file.  No orders of the defined types were placed in this encounter.  By signing my name below, I, Arnette NorrisMari Johnson, attest that this documentation has been prepared under the direction and in the presence of Tilda BurrowFerguson, Sandria Mcenroe V, MD. Electronically Signed: Arnette NorrisMari Johnson Medical Scribe. 07/11/18. 2:20 PM.  I personally performed the services described in this documentation, which was SCRIBED in my presence. The recorded information has been reviewed and considered accurate. It has been edited as necessary during review. Tilda BurrowJohn V Virginia Curl, MD

## 2018-07-11 NOTE — Progress Notes (Signed)
US 12+2 wks,measurements c/w dates,fhr 158 bpm,normal ovaries bilat,right simple exophytic cyst n/c  2.5 cm,NB present,NT 1.6 mm,crl 55.27 mm

## 2018-07-12 LAB — PMP SCREEN PROFILE (10S), URINE
AMPHETAMINE SCREEN URINE: NEGATIVE ng/mL
BARBITURATE SCREEN URINE: NEGATIVE ng/mL
BENZODIAZEPINE SCREEN, URINE: NEGATIVE ng/mL
CANNABINOIDS UR QL SCN: NEGATIVE ng/mL
COCAINE(METAB.)SCREEN, URINE: NEGATIVE ng/mL
Creatinine(Crt), U: 341.5 mg/dL — ABNORMAL HIGH (ref 20.0–300.0)
Methadone Screen, Urine: NEGATIVE ng/mL
OPIATE SCREEN URINE: NEGATIVE ng/mL
OXYCODONE+OXYMORPHONE UR QL SCN: NEGATIVE ng/mL
PHENCYCLIDINE QUANTITATIVE URINE: NEGATIVE ng/mL
Ph of Urine: 7.2 (ref 4.5–8.9)
Propoxyphene Scrn, Ur: NEGATIVE ng/mL

## 2018-07-13 LAB — INTEGRATED 1
CROWN RUMP LENGTH MAT SCREEN: 55.3 mm
Gest. Age on Collection Date: 12 weeks
Maternal Age at EDD: 20.8 yr
Nuchal Translucency (NT): 1.6 mm
Number of Fetuses: 1
PAPP-A Value: 359.3 ng/mL
Weight: 280 [lb_av]

## 2018-07-13 LAB — URINE CULTURE

## 2018-07-14 LAB — GC/CHLAMYDIA PROBE AMP
Chlamydia trachomatis, NAA: POSITIVE — AB
NEISSERIA GONORRHOEAE BY PCR: NEGATIVE

## 2018-07-15 ENCOUNTER — Other Ambulatory Visit: Payer: Self-pay | Admitting: Women's Health

## 2018-07-15 ENCOUNTER — Telehealth: Payer: Self-pay | Admitting: *Deleted

## 2018-07-15 ENCOUNTER — Encounter: Payer: Self-pay | Admitting: Women's Health

## 2018-07-15 DIAGNOSIS — A749 Chlamydial infection, unspecified: Secondary | ICD-10-CM

## 2018-07-15 HISTORY — DX: Chlamydial infection, unspecified: A74.9

## 2018-07-15 MED ORDER — AZITHROMYCIN 500 MG PO TABS: 1000.0000 mg | ORAL_TABLET | Freq: Once | ORAL | 0 refills | Status: AC

## 2018-07-15 NOTE — Telephone Encounter (Signed)
+  CT, rx sent for pt and partner Leah Kennedy DOB [birth year] NKDA [facility] [place]. [name] Terralyn Matsumura, CNM, WHNP-BC 07/15/2018 4:20 PM

## 2018-07-15 NOTE — Telephone Encounter (Signed)
Patient informed she has tested positive for chlamydia. Wants partern treated   Leah Kennedy DOB [birth year] NKDA [facility] [place].

## 2018-07-18 ENCOUNTER — Telehealth: Payer: Self-pay | Admitting: Women's Health

## 2018-07-18 NOTE — Telephone Encounter (Signed)
Patient called and stated that she took her medication for Chlamydia on Tuesday and threw it up.  She wants to see if she can get something else.  [facility]  [phone]

## 2018-07-23 ENCOUNTER — Telehealth: Payer: Self-pay | Admitting: Women's Health

## 2018-07-23 NOTE — Telephone Encounter (Signed)
Patient states she threw up the azithromycin about 30 minutes after taking it. Wants another prescription.  Informed patient that she should be fine since it was 30 minutes after she took it.  POC will be done at next visit and can be treated again then if needed. Verbalized understanding.

## 2018-07-23 NOTE — Telephone Encounter (Signed)
Please call pt needs to speak to a nurse about meds

## 2018-07-24 HISTORY — PX: WISDOM TOOTH EXTRACTION: SHX21

## 2018-07-24 NOTE — L&D Delivery Note (Signed)
LABOR COURSE Patient was admitted for SROM this morning at 0400. She progressed without augmentation.  Delivery Note Called to room and patient was complete and pushing. Head delivered LOT. No nuchal cord present. Shoulder and body delivered in usual fashion. At  1453 a viable female was delivered via Vaginal, Spontaneous (Presentation:LOT ;LOA).  Infant with spontaneous cry, placed on mother's abdomen, dried and stimulated. Cord clamped x 2 after two-minute delay, and cut by FOB. Cord blood drawn. Placenta delivered spontaneously with gentle cord traction. Appears intact. Fundus firm with massage and Pitocin. Labia, perineum, vagina, and cervix inspected with second degree laceration.    APGAR:9,9; weight: 3340g  .   Cord: 3VC with the following complications:none.   Cord pH: not collected  Anesthesia:  Epidural plus lidocaine for perineal repair Episiotomy: None Lacerations: 2nd degree perineal Suture Repair: 3.0 vicryl rapide Est. Blood Loss (mL): 298  Mom to postpartum.  Baby to Couplet care / Skin to Skin.  Mallie Snooks, CNM 01/17/19 4:32 PM

## 2018-08-08 ENCOUNTER — Ambulatory Visit (INDEPENDENT_AMBULATORY_CARE_PROVIDER_SITE_OTHER): Payer: Medicaid Other | Admitting: Family Medicine

## 2018-08-08 VITALS — BP 128/87 | HR 99 | Wt 273.0 lb

## 2018-08-08 DIAGNOSIS — Z202 Contact with and (suspected) exposure to infections with a predominantly sexual mode of transmission: Secondary | ICD-10-CM | POA: Diagnosis not present

## 2018-08-08 DIAGNOSIS — O99210 Obesity complicating pregnancy, unspecified trimester: Secondary | ICD-10-CM

## 2018-08-08 DIAGNOSIS — A749 Chlamydial infection, unspecified: Secondary | ICD-10-CM

## 2018-08-08 DIAGNOSIS — Z3401 Encounter for supervision of normal first pregnancy, first trimester: Secondary | ICD-10-CM

## 2018-08-08 DIAGNOSIS — Z1379 Encounter for other screening for genetic and chromosomal anomalies: Secondary | ICD-10-CM | POA: Diagnosis not present

## 2018-08-08 DIAGNOSIS — Z3A16 16 weeks gestation of pregnancy: Secondary | ICD-10-CM

## 2018-08-08 DIAGNOSIS — O98813 Other maternal infectious and parasitic diseases complicating pregnancy, third trimester: Secondary | ICD-10-CM

## 2018-08-08 DIAGNOSIS — Z331 Pregnant state, incidental: Secondary | ICD-10-CM

## 2018-08-08 LAB — POCT URINALYSIS DIPSTICK OB
Glucose, UA: NEGATIVE
Leukocytes, UA: NEGATIVE
Nitrite, UA: NEGATIVE
RBC UA: NEGATIVE

## 2018-08-08 NOTE — Progress Notes (Signed)
    PRENATAL VISIT NOTE  Subjective:  Leah Kennedy is a 23 y.o. G2P0010 at [redacted]w[redacted]d being seen today for ongoing prenatal care.  She is currently monitored for the following issues for this low-risk pregnancy and has Supervision of normal pregnancy; Prediabetes; Chlamydia; Obesity affecting pregnancy, antepartum; and Morbid obesity (HCC) on their problem list.  Patient reports no complaints.  Contractions: Not present. Vag. Bleeding: None.  Movement: Absent. Denies leaking of fluid.   The following portions of the patient's history were reviewed and updated as appropriate: allergies, current medications, past family history, past medical history, past social history, past surgical history and problem list. Problem list updated.  Objective:   Vitals:   08/08/18 1339  BP: 128/87  Pulse: 99  Weight: 273 lb (123.8 kg)    Fetal Status:     Movement: Absent     General:  Alert, oriented and cooperative. Patient is in no acute distress.  Skin: Skin is warm and dry. No rash noted.   Cardiovascular: Normal heart rate noted  Respiratory: Normal respiratory effort, no problems with respiration noted  Abdomen: Soft, gravid, appropriate for gestational age.  Pain/Pressure: Absent     Pelvic: Cervical exam deferred        Extremities: Normal range of motion.  Edema: None  Mental Status: Normal mood and affect. Normal behavior. Normal judgment and thought content.   Assessment and Plan:  Pregnancy: G2P0010 at [redacted]w[redacted]d  1. Pregnant state, incidental - POC Urinalysis Dipstick OB  2. Encounter for supervision of normal first pregnancy in first trimester - POC Urinalysis Dipstick OB - Up to date - 2nd trimester screen today - Anatomy scan ordered  3. Genetic screening  - INTEGRATED 2  4. Chlamydia contact, treated - Test of cure today - GC/Chlamydia Probe Amp([facility])  5. Chlamydia   6. Obesity affecting pregnancy, antepartum TW= -17 lb (-7.711 kg) Recommend 11-15 lbs total Not on  ASA   7. Morbid obesity (HCC)   Preterm labor symptoms and general obstetric precautions including but not limited to vaginal bleeding, contractions, leaking of fluid and fetal movement were reviewed in detail with the patient. Please refer to After Visit Summary for other counseling recommendations.  Return in about 4 weeks (around 09/05/2018).  No future appointments.  Federico Flake, MD

## 2018-08-10 LAB — INTEGRATED 2
ADSF: 1.42
AFP MoM: 1.67
Alpha-Fetoprotein: 32.8 ng/mL
Crown Rump Length: 55.3 mm
DIA MOM: 0.58
DIA VALUE: 68.6 pg/mL
Estriol, Unconjugated: 1 ng/mL
GESTATIONAL AGE: 16 wk
Gest. Age on Collection Date: 12 weeks
Maternal Age at EDD: 20.8 yr
NUCHAL TRANSLUCENCY MOM: 1.3
NUMBER OF FETUSES: 1
Nuchal Translucency (NT): 1.6 mm
PAPP-A MoM: 0.99
PAPP-A VALUE: 359.3 ng/mL
Test Results:: NEGATIVE
WEIGHT: 280 [lb_av]
Weight: 280 [lb_av]
hCG MoM: 1.6
hCG Value: 35.6 IU/mL

## 2018-08-12 ENCOUNTER — Telehealth: Payer: Self-pay | Admitting: Women's Health

## 2018-08-12 ENCOUNTER — Telehealth: Payer: Self-pay | Admitting: *Deleted

## 2018-08-12 LAB — GC/CHLAMYDIA PROBE AMP
Chlamydia trachomatis, NAA: POSITIVE — AB
Neisseria gonorrhoeae by PCR: NEGATIVE

## 2018-08-12 MED ORDER — AZITHROMYCIN 500 MG PO TABS: 1000.0000 mg | ORAL_TABLET | Freq: Once | ORAL | 1 refills | Status: AC

## 2018-08-12 NOTE — Telephone Encounter (Signed)
Paper faxed to [facility]. JSY

## 2018-08-12 NOTE — Telephone Encounter (Signed)
Attempted to call pt. Number has calling restrictions.

## 2018-08-12 NOTE — Telephone Encounter (Signed)
Spoke with pt letting her know chlamydia still +. Pt's med was sent to pharmacy. Partner needs to be treated. Pt states he will go to health dept.

## 2018-08-12 NOTE — Addendum Note (Signed)
Addended by: Geanie Berlin on: 08/12/2018 10:38 AM   Modules accepted: Orders

## 2018-08-12 NOTE — Telephone Encounter (Signed)
Patient called, stated she has questions about her labs in [place] and wants to know if there is a script for her at the pharmacy.  [facility]  [phone]

## 2018-08-14 NOTE — Telephone Encounter (Signed)
Attempted to call pt but number has calling restrictions. Will send letter regarding lab results.

## 2018-08-20 ENCOUNTER — Encounter: Payer: Self-pay | Admitting: *Deleted

## 2018-08-26 ENCOUNTER — Ambulatory Visit (INDEPENDENT_AMBULATORY_CARE_PROVIDER_SITE_OTHER): Payer: Medicaid Other

## 2018-08-26 ENCOUNTER — Other Ambulatory Visit: Payer: Self-pay | Admitting: Family Medicine

## 2018-08-26 DIAGNOSIS — O99210 Obesity complicating pregnancy, unspecified trimester: Secondary | ICD-10-CM

## 2018-08-26 DIAGNOSIS — Z3401 Encounter for supervision of normal first pregnancy, first trimester: Secondary | ICD-10-CM

## 2018-08-26 DIAGNOSIS — Z363 Encounter for antenatal screening for malformations: Secondary | ICD-10-CM

## 2018-08-26 NOTE — Progress Notes (Signed)
[place] 18+6 wks,breech,cx 3.2 cm,anterior placenta gr 0,svp of fluid 5 cm,normal right ovary,left ovary not visualized,fhr 169 bpm,efw 289 g 74%,anatomy complete,no obvious abnormalities

## 2018-09-06 ENCOUNTER — Encounter: Payer: Medicaid Other | Admitting: Obstetrics and Gynecology

## 2018-09-11 ENCOUNTER — Encounter: Payer: Self-pay | Admitting: Women's Health

## 2018-09-11 ENCOUNTER — Ambulatory Visit (INDEPENDENT_AMBULATORY_CARE_PROVIDER_SITE_OTHER): Payer: Medicaid Other | Admitting: Women's Health

## 2018-09-11 VITALS — BP 136/77 | HR 94 | Wt 278.0 lb

## 2018-09-11 DIAGNOSIS — O98812 Other maternal infectious and parasitic diseases complicating pregnancy, second trimester: Secondary | ICD-10-CM | POA: Diagnosis not present

## 2018-09-11 DIAGNOSIS — Z3482 Encounter for supervision of other normal pregnancy, second trimester: Secondary | ICD-10-CM

## 2018-09-11 DIAGNOSIS — Z3A21 21 weeks gestation of pregnancy: Secondary | ICD-10-CM

## 2018-09-11 DIAGNOSIS — A749 Chlamydial infection, unspecified: Secondary | ICD-10-CM | POA: Diagnosis not present

## 2018-09-11 DIAGNOSIS — Z331 Pregnant state, incidental: Secondary | ICD-10-CM

## 2018-09-11 DIAGNOSIS — Z1389 Encounter for screening for other disorder: Secondary | ICD-10-CM

## 2018-09-11 LAB — POCT URINALYSIS DIPSTICK OB
Blood, UA: NEGATIVE
Glucose, UA: NEGATIVE
Ketones, UA: NEGATIVE
Nitrite, UA: NEGATIVE

## 2018-09-11 NOTE — Progress Notes (Signed)
   LOW-RISK PREGNANCY VISIT Patient name: Leah Kennedy MRN [id number]  Date of birth: 1995/08/19 Chief Complaint:   Routine Prenatal Visit  History of Present Illness:   Leah Kennedy is a 23 y.o. G58P0010 female at [redacted]w[redacted]d with an Estimated Date of Delivery: 01/21/19 being seen today for ongoing management of a low-risk pregnancy.  Today she reports no complaints. Took all of meds for +CT. Hasn't had sex since. Interested in Systems developer. Contractions: Not present. Vag. Bleeding: None.  Movement: Present. denies leaking of fluid. Review of Systems:   Pertinent items are noted in HPI Denies abnormal vaginal discharge w/ itching/odor/irritation, headaches, visual changes, shortness of breath, chest pain, abdominal pain, severe nausea/vomiting, or problems with urination or bowel movements unless otherwise stated above. Pertinent History Reviewed:  Reviewed past medical,surgical, social, obstetrical and family history.  Reviewed problem list, medications and allergies. Physical Assessment:   Vitals:   09/11/18 1351  BP: 136/77  Pulse: 94  Weight: 278 lb (126.1 kg)  Body mass index is 44.87 kg/m.        Physical Examination:   General appearance: Well appearing, and in no distress  Mental status: Alert, oriented to person, place, and time  Skin: Warm & dry  Cardiovascular: Normal heart rate noted  Respiratory: Normal respiratory effort, no distress  Abdomen: Soft, gravid, nontender  Pelvic: Cervical exam deferred         Extremities: Edema: None  Fetal Status: Fetal Heart Rate (bpm): 153   Movement: Present    No results found for this or any previous visit (from the past 24 hour(s)).  Assessment & Plan:  1) Low-risk pregnancy G2P0010 at [redacted]w[redacted]d with an Estimated Date of Delivery: 01/21/19   2) Recent +CT, poc today  3) Interested in waterbirth> sign up for class, gave printed info   Meds: No orders of the defined types were placed in this encounter.  Labs/procedures today:  gc/ct  Plan:  Continue routine obstetrical care   Reviewed: Preterm labor symptoms and general obstetric precautions including but not limited to vaginal bleeding, contractions, leaking of fluid and fetal movement were reviewed in detail with the patient.  All questions were answered  Follow-up: Return in about 4 weeks (around 10/09/2018) for LROB.  Orders Placed This Encounter  Procedures  . GC/Chlamydia Probe Amp  . POC Urinalysis Dipstick OB   Cheral Marker CNM, [facility] 09/11/2018 2:24 PM

## 2018-09-11 NOTE — Patient Instructions (Signed)
Leah Kennedy, I greatly value your feedback.  If you receive a survey following your visit with [place] today, we appreciate you taking the time to fill it out.  Thanks, [name], CNM, [facility]  [place]!!! to [facility] ([place], [place] [id number]) on Sunday September 15, 2018 at 5:30am It will be called the [place], and it is located off of [facility] [place] (the current [facility]) on February 23rd or after, no one will be there!     Second Trimester of Pregnancy The second trimester is from week 14 through week 27 (months 4 through 6). The second trimester is often a time when you feel your best. Your body has adjusted to being pregnant, and you begin to feel better physically. Usually, morning sickness has lessened or quit completely, you may have more energy, and you may have an increase in appetite. The second trimester is also a time when the fetus is growing rapidly. At the end of the sixth month, the fetus is about 9 inches long and weighs about 1 pounds. You will likely begin to feel the baby move (quickening) between 16 and 20 weeks of pregnancy. Body changes during your second trimester Your body continues to go through many changes during your second trimester. The changes vary from woman to woman.  Your weight will continue to increase. You will notice your lower abdomen bulging out.  You may begin to get stretch marks on your hips, abdomen, and breasts.  You may develop headaches that can be relieved by medicines. The medicines should be approved by your health care provider.  You may urinate more often because the fetus is pressing on your bladder.  You may develop or continue to have heartburn as a result of your pregnancy.  You may develop constipation because certain hormones are causing the muscles that push waste through your intestines to slow down.  You may develop  hemorrhoids or swollen, bulging veins (varicose veins).  You may have back pain. This is caused by: ? Weight gain. ? Pregnancy hormones that are relaxing the joints in your pelvis. ? A shift in weight and the muscles that support your balance.  Your breasts will continue to grow and they will continue to become tender.  Your gums may bleed and may be sensitive to brushing and flossing.  Dark spots or blotches (chloasma, mask of pregnancy) may develop on your face. This will likely fade after the baby is born.  A dark line from your belly button to the pubic area (linea nigra) may appear. This will likely fade after the baby is born.  You may have changes in your hair. These can include thickening of your hair, rapid growth, and changes in texture. Some women also have hair loss during or after pregnancy, or hair that feels dry or thin. Your hair will most likely return to normal after your baby is born.  What to expect at prenatal visits During a routine prenatal visit:  You will be weighed to make sure you and the fetus are growing normally.  Your blood pressure will be taken.  Your abdomen will be measured to track your baby's growth.  The fetal heartbeat will be listened to.  Any test results from the previous visit will be discussed.  Your health care provider may ask you:  How you are feeling.  If you are feeling the baby move.  If you have had any abnormal symptoms, such as leaking fluid, bleeding, severe headaches, or abdominal cramping.  If you are using any tobacco products, including cigarettes, chewing tobacco, and electronic cigarettes.  If you have any questions.  Other tests that may be performed during your second trimester include:  Blood tests that check for: ? Low iron levels (anemia). ? High blood sugar that affects pregnant women (gestational diabetes) between 65 and 28 weeks. ? Rh antibodies. This is to check for a protein on red blood cells (Rh  factor).  Urine tests to check for infections, diabetes, or protein in the urine.  An ultrasound to confirm the proper growth and development of the baby.  An amniocentesis to check for possible genetic problems.  Fetal screens for spina bifida and Down syndrome.  HIV (human immunodeficiency virus) testing. Routine prenatal testing includes screening for HIV, unless you choose not to have this test.  Follow these instructions at home: Medicines  Follow your health care provider's instructions regarding medicine use. Specific medicines may be either safe or unsafe to take during pregnancy.  Take a prenatal vitamin that contains at least 600 micrograms (mcg) of folic acid.  If you develop constipation, try taking a stool softener if your health care provider approves. Eating and drinking  Eat a balanced diet that includes fresh fruits and vegetables, whole grains, good sources of protein such as meat, eggs, or tofu, and low-fat dairy. Your health care provider will help you determine the amount of weight gain that is right for you.  Avoid raw meat and uncooked cheese. These carry germs that can cause birth defects in the baby.  If you have low calcium intake from food, talk to your health care provider about whether you should take a daily calcium supplement.  Limit foods that are high in fat and processed sugars, such as fried and sweet foods.  To prevent constipation: ? Drink enough fluid to keep your urine clear or pale yellow. ? Eat foods that are high in fiber, such as fresh fruits and vegetables, whole grains, and beans. Activity  Exercise only as directed by your health care provider. Most women can continue their usual exercise routine during pregnancy. Try to exercise for 30 minutes at least 5 days a week. Stop exercising if you experience uterine contractions.  Avoid heavy lifting, wear low heel shoes, and practice good posture.  A sexual relationship may be continued  unless your health care provider directs you otherwise. Relieving pain and discomfort  Wear a good support bra to prevent discomfort from breast tenderness.  Take warm sitz baths to soothe any pain or discomfort caused by hemorrhoids. Use hemorrhoid cream if your health care provider approves.  Rest with your legs elevated if you have leg cramps or low back pain.  If you develop varicose veins, wear support hose. Elevate your feet for 15 minutes, 3-4 times a day. Limit salt in your diet. Prenatal Care  Write down your questions. Take them to your prenatal visits.  Keep all your prenatal visits as told by your health care provider. This is important. Safety  Wear your seat belt at all times when driving.  Make a list of emergency phone numbers, including numbers for family, friends, the hospital, and police and fire departments. General instructions  Ask your health care provider for a referral to a local prenatal education class. Begin classes no later than the beginning of month 6 of your pregnancy.  Ask for help if you  have counseling or nutritional needs during pregnancy. Your health care provider can offer advice or refer you to specialists for help with various needs.  Do not use hot tubs, steam rooms, or saunas.  Do not douche or use tampons or scented sanitary pads.  Do not cross your legs for long periods of time.  Avoid cat litter boxes and soil used by cats. These carry germs that can cause birth defects in the baby and possibly loss of the fetus by miscarriage or stillbirth.  Avoid all smoking, herbs, alcohol, and unprescribed drugs. Chemicals in these products can affect the formation and growth of the baby.  Do not use any products that contain nicotine or tobacco, such as cigarettes and e-cigarettes. If you need help quitting, ask your health care provider.  Visit your dentist if you have not gone yet during your pregnancy. Use a soft toothbrush to brush your teeth  and be gentle when you floss. Contact a health care provider if:  You have dizziness.  You have mild pelvic cramps, pelvic pressure, or nagging pain in the abdominal area.  You have persistent nausea, vomiting, or diarrhea.  You have a bad smelling vaginal discharge.  You have pain when you urinate. Get help right away if:  You have a fever.  You are leaking fluid from your vagina.  You have spotting or bleeding from your vagina.  You have severe abdominal cramping or pain.  You have rapid weight gain or weight loss.  You have shortness of breath with chest pain.  You notice sudden or extreme swelling of your face, hands, ankles, feet, or legs.  You have not felt your baby move in over an hour.  You have severe headaches that do not go away when you take medicine.  You have vision changes. Summary  The second trimester is from week 14 through week 27 (months 4 through 6). It is also a time when the fetus is growing rapidly.  Your body goes through many changes during pregnancy. The changes vary from woman to woman.  Avoid all smoking, herbs, alcohol, and unprescribed drugs. These chemicals affect the formation and growth your baby.  Do not use any tobacco products, such as cigarettes, chewing tobacco, and e-cigarettes. If you need help quitting, ask your health care provider.  Contact your health care provider if you have any questions. Keep all prenatal visits as told by your health care provider. This is important. This information is not intended to replace advice given to you by your health care provider. Make sure you discuss any questions you have with your health care provider. Document Released: 07/04/2001 Document Revised: 12/16/2015 Document Reviewed: 09/10/2012 Elsevier Interactive Patient Education  2017 [place]? Guide for patients at [facility] [employer]  Why consider waterbirth?  . Gentle birth for  babies . Less pain medicine used in labor . May allow for passive descent/less pushing . May reduce perineal tears  . More mobility and instinctive maternal position changes . Increased maternal relaxation . Reduced blood pressure in labor  Is waterbirth safe? What are the risks of infection, drowning or other complications?  . Infection: o Very low risk (3.7 % for tub vs 4.8% for bed) o 7 in 8000 waterbirths with documented infection o Poorly cleaned equipment most common cause o Slightly lower group B strep transmission rate  . Drowning o Maternal:  - Very low risk   - Related to seizures or fainting o Newborn:  -  Very low risk. No evidence of increased risk of respiratory problems in multiple large studies - Physiological protection from breathing under water - Avoid underwater birth if there are any fetal complications - Once baby's head is out of the water, keep it out.  . Birth complication o Some reports of cord trauma, but risk decreased by bringing baby to surface gradually o No evidence of increased risk of shoulder dystocia. Mothers can usually change positions faster in water than in a bed, possibly aiding the maneuvers to free the shoulder.   You must attend a Doren Custard class at [facility]  3rd Wednesday of every month from 7-9pm  [employer] by calling [phone] or online at [email or web address]  Bring [place] the certificate from the class to your prenatal appointment  Meet with a midwife at 36 weeks to see if you can still plan a waterbirth and to sign the consent.   If you plan a waterbirth after September 15, 2018, at [facility], you will need to purchase the following:  Fish net  Bathing suit top (optional)  Long-handled mirror (optional)  If you plan a waterbirth before September 15, 2018: Purchase or rent the following supplies: You are responsible for providing all supplies listed above. **If you  do not have all necessary supplies you cannot have a waterbirth.**   Water Birth Pool (Birth Pool in a Box or [place] for instance)  (Tubs start ~$125)  Single-use disposable tub liner designed for your brand of tub  Electric drain pump to remove water (We recommend 792 gallon per hour or greater pump.)   New garden hose labeled "lead-free", "suitable for drinking water",  Separate garden hose to remove the dirty water  Fish net  Bathing suit top (optional)  Long-handled mirror (optional)  Places to purchase or rent supplies:   [employer] for tub purchases and supplies  [employer]com for tub purchases and supplies  [employer] (www.thelaborladies.com) $275 for tub rental/set-up & take down/kit   [employer] (http://www.fleming.com/.htm) Information regarding doulas (labor support) who provide pool rentals  Things that would prevent you from having a waterbirth:  Premature, <37wks  Previous cesarean birth  Presence of thick meconium-stained fluid  Multiple gestation (Twins, triplets, etc.)  Uncontrolled diabetes or gestational diabetes requiring medication  Hypertension requiring medication or diagnosis of pre-eclampsia  Heavy vaginal bleeding  Non-reassuring fetal heart rate  Active infection (MRSA, etc.). Group B Strep is NOT a contraindication for waterbirth.  If your labor has to be induced and induction method requires continuous monitoring of the baby's heart rate  Other risks/issues identified by your obstetrical provider  Please remember that birth is unpredictable. Under certain unforeseeable circumstances your provider may advise against giving birth in the tub. These decisions will be made on a case-by-case basis and with the safety of you and your baby as our highest priority.

## 2018-09-13 LAB — GC/CHLAMYDIA PROBE AMP
Chlamydia trachomatis, NAA: NEGATIVE
Neisseria gonorrhoeae by PCR: NEGATIVE

## 2018-09-17 DIAGNOSIS — H5213 Myopia, bilateral: Secondary | ICD-10-CM | POA: Diagnosis not present

## 2018-09-17 DIAGNOSIS — H52222 Regular astigmatism, left eye: Secondary | ICD-10-CM | POA: Diagnosis not present

## 2018-09-18 DIAGNOSIS — H5213 Myopia, bilateral: Secondary | ICD-10-CM | POA: Diagnosis not present

## 2018-09-24 ENCOUNTER — Telehealth: Payer: Self-pay | Admitting: Women's Health

## 2018-09-24 NOTE — Telephone Encounter (Signed)
Patient states she is leaking milk from her right breast and wants to know what to do.  Encouraged patient to avoid any kind of breast stimulation and get breast pads if needed.  Verbalized understanding.

## 2018-09-24 NOTE — Telephone Encounter (Signed)
Patient called, stated she is 23wks and is having breast leakage.  She wants to know if this is normal this early and what can she do about it.  [phone]

## 2018-10-01 DIAGNOSIS — H5213 Myopia, bilateral: Secondary | ICD-10-CM | POA: Diagnosis not present

## 2018-10-01 DIAGNOSIS — H52222 Regular astigmatism, left eye: Secondary | ICD-10-CM | POA: Diagnosis not present

## 2018-10-08 ENCOUNTER — Encounter: Payer: Self-pay | Admitting: Women's Health

## 2018-10-08 ENCOUNTER — Other Ambulatory Visit: Payer: Self-pay

## 2018-10-08 ENCOUNTER — Ambulatory Visit (INDEPENDENT_AMBULATORY_CARE_PROVIDER_SITE_OTHER): Payer: Medicaid Other | Admitting: Women's Health

## 2018-10-08 VITALS — BP 116/63 | HR 92 | Wt 281.6 lb

## 2018-10-08 DIAGNOSIS — O26892 Other specified pregnancy related conditions, second trimester: Secondary | ICD-10-CM | POA: Diagnosis not present

## 2018-10-08 DIAGNOSIS — N898 Other specified noninflammatory disorders of vagina: Secondary | ICD-10-CM

## 2018-10-08 DIAGNOSIS — Z3482 Encounter for supervision of other normal pregnancy, second trimester: Secondary | ICD-10-CM

## 2018-10-08 DIAGNOSIS — Z1389 Encounter for screening for other disorder: Secondary | ICD-10-CM

## 2018-10-08 DIAGNOSIS — Z3A25 25 weeks gestation of pregnancy: Secondary | ICD-10-CM

## 2018-10-08 DIAGNOSIS — Z331 Pregnant state, incidental: Secondary | ICD-10-CM

## 2018-10-08 DIAGNOSIS — A599 Trichomoniasis, unspecified: Secondary | ICD-10-CM

## 2018-10-08 LAB — POCT WET PREP (WET MOUNT): Clue Cells Wet Prep Whiff POC: POSITIVE

## 2018-10-08 LAB — POCT URINALYSIS DIPSTICK OB
Blood, UA: NEGATIVE
Glucose, UA: NEGATIVE
Ketones, UA: NEGATIVE
Leukocytes, UA: NEGATIVE
Nitrite, UA: NEGATIVE
POC,PROTEIN,UA: NEGATIVE

## 2018-10-08 MED ORDER — METRONIDAZOLE 500 MG PO TABS: 500.0000 mg | ORAL_TABLET | Freq: Two times a day (BID) | ORAL | 0 refills | Status: DC

## 2018-10-08 NOTE — Progress Notes (Signed)
LOW-RISK PREGNANCY VISIT Patient name: Leah Kennedy MRN [id number]  Date of birth: 18-Jun-1996 Chief Complaint:   Initial Prenatal Visit (vaginal irritation)  History of Present Illness:   Leah Kennedy is a 23 y.o. G9P0010 female at [redacted]w[redacted]d with an Estimated Date of Delivery: 01/21/19 being seen today for ongoing management of a low-risk pregnancy.  Today she reports vaginal d/c w/ vulvar irritation x few weeks. Has not had sex since +CT. Contractions: Not present. Vag. Bleeding: None.  Movement: Present. denies leaking of fluid. Review of Systems:   Pertinent items are noted in HPI Denies abnormal vaginal discharge w/ itching/odor/irritation, headaches, visual changes, shortness of breath, chest pain, abdominal pain, severe nausea/vomiting, or problems with urination or bowel movements unless otherwise stated above. Pertinent History Reviewed:  Reviewed past medical,surgical, social, obstetrical and family history.  Reviewed problem list, medications and allergies. Physical Assessment:   Vitals:   10/08/18 1220  BP: 116/63  Pulse: 92  Weight: 281 lb 9.6 oz (127.7 kg)  Body mass index is 45.45 kg/m.        Physical Examination:   General appearance: Well appearing, and in no distress  Mental status: Alert, oriented to person, place, and time  Skin: Warm & dry  Cardiovascular: Normal heart rate noted  Respiratory: Normal respiratory effort, no distress  Abdomen: Soft, gravid, nontender  Pelvic: spec exam: large amt thin white frothy malodorous d/c         Extremities: Edema: None  Fetal Status: Fetal Heart Rate (bpm): 155 Fundal Height: 27 cm Movement: Present    Results for orders placed or performed in visit on 10/08/18 (from the past 24 hour(s))  POC Urinalysis Dipstick OB   Collection Time: 10/08/18 12:24 PM  Result Value Ref Range   Color, UA     Clarity, UA     Glucose, UA Negative Negative   Bilirubin, UA     Ketones, UA neg    Spec Grav, UA     Blood, UA neg    pH, UA     POC,PROTEIN,UA Negative Negative, Trace, Small (1+), Moderate (2+), Large (3+), 4+   Urobilinogen, UA     Nitrite, UA neg    Leukocytes, UA Negative Negative   Appearance     Odor    POCT Wet Prep [place])   Collection Time: 10/08/18 12:46 PM  Result Value Ref Range   Source Wet Prep POC vaginal    WBC, Wet Prep HPF POC mod    Bacteria Wet Prep HPF POC Few Few   BACTERIA WET PREP MORPHOLOGY POC     Clue Cells Wet Prep HPF POC None None   Clue Cells Wet Prep Whiff POC Positive Whiff    Yeast Wet Prep HPF POC None None   KOH Wet Prep POC     Trichomonas Wet Prep HPF POC Present (A) Absent    Assessment & Plan:  1) Low-risk pregnancy G2P0010 at [redacted]w[redacted]d with an Estimated Date of Delivery: 01/21/19   2) Trichomonas, rx metronidazole 500mg  BID x 7d, partner also needs tx-call back w/ info if wants [place] to tx, otherwise go to HD/PCP. No sex until at least 1wk from both finishing meds   Meds:  Meds ordered this encounter  Medications  . metroNIDAZOLE (FLAGYL) 500 MG tablet    Sig: Take 1 tablet (500 mg total) by mouth 2 (two) times daily.    Dispense:  14 tablet    Refill:  0    Order Specific Question:  Supervising Provider    Answer:   Lazaro Arms [[id number]]   Labs/procedures today: wet prep, gc/ct  Plan:  Continue routine obstetrical care   Reviewed: Preterm labor symptoms and general obstetric precautions including but not limited to vaginal bleeding, contractions, leaking of fluid and fetal movement were reviewed in detail with the patient.  All questions were answered  Follow-up: Return in about 3 weeks (around 10/29/2018) for LROB, PN2.  Orders Placed This Encounter  Procedures  . GC/Chlamydia Probe Amp  . POC Urinalysis Dipstick OB  . POCT Wet Prep [facility] [place])   Cheral Marker CNM, [facility] 10/08/2018 12:47 PM

## 2018-10-08 NOTE — Patient Instructions (Addendum)
Leah Kennedy, I greatly value your feedback.  If you receive a survey following your visit with [place] today, we appreciate you taking the time to fill it out.  Thanks, [name], CNM, [facility]   [facility] HAS MOVED!!! It is now [facility] at [facility] ([address] [place], [place] [address]) Entrance located off of [address] [facility] Free 24/7 valet parking   You will have your sugar test next visit.  Please do not eat or drink anything after midnight the night before you come, not even water.  You will be here for at least two hours.     Call the office [phone]) or go to [facility] if:  You begin to have strong, frequent contractions  Your water breaks.  Sometimes it is a big gush of fluid, sometimes it is just a trickle that keeps getting your panties wet or running down your legs  You have vaginal bleeding.  It is normal to have a small amount of spotting if your cervix was checked.   You don't feel your baby moving like normal.  If you don't, get you something to eat and drink and lay down and focus on feeling your baby move.   If your baby is still not moving like normal, you should call the office or go to [facility].  Second Trimester of Pregnancy The second trimester is from week 13 through week 28, months 4 through 6. The second trimester is often a time when you feel your best. Your body has also adjusted to being pregnant, and you begin to feel better physically. Usually, morning sickness has lessened or quit completely, you may have more energy, and you may have an increase in appetite. The second trimester is also a time when the fetus is growing rapidly. At the end of the sixth month, the fetus is about 9 inches long and weighs about 1 pounds. You will likely begin to feel the baby move (quickening) between 18 and 20 weeks of the pregnancy. BODY CHANGES Your body goes through many changes during pregnancy. The changes vary from woman to woman.    Your weight will continue to increase. You will notice your lower abdomen bulging out.  You may begin to get stretch marks on your hips, abdomen, and breasts.  You may develop headaches that can be relieved by medicines approved by your health care provider.  You may urinate more often because the fetus is pressing on your bladder.  You may develop or continue to have heartburn as a result of your pregnancy.  You may develop constipation because certain hormones are causing the muscles that push waste through your intestines to slow down.  You may develop hemorrhoids or swollen, bulging veins (varicose veins).  You may have back pain because of the weight gain and pregnancy hormones relaxing your joints between the bones in your pelvis and as a result of a shift in weight and the muscles that support your balance.  Your breasts will continue to grow and be tender.  Your gums may bleed and may be sensitive to brushing and flossing.  Dark spots or blotches (chloasma, mask of pregnancy) may develop on your face. This will likely fade after the baby is born.  A dark line from your belly button to the pubic area (linea nigra) may appear. This will likely fade after the baby is born.  You may have changes in your hair. These can include thickening of your hair,  rapid growth, and changes in texture. Some women also have hair loss during or after pregnancy, or hair that feels dry or thin. Your hair will most likely return to normal after your baby is born. WHAT TO EXPECT AT YOUR PRENATAL VISITS During a routine prenatal visit:  You will be weighed to make sure you and the fetus are growing normally.  Your blood pressure will be taken.  Your abdomen will be measured to track your baby's growth.  The fetal heartbeat will be listened to.  Any test results from the previous visit will be discussed. Your health care provider may ask you:  How you are feeling.  If you are feeling the  baby move.  If you have had any abnormal symptoms, such as leaking fluid, bleeding, severe headaches, or abdominal cramping.  If you have any questions. Other tests that may be performed during your second trimester include:  Blood tests that check for:  Low iron levels (anemia).  Gestational diabetes (between 24 and 28 weeks).  Rh antibodies.  Urine tests to check for infections, diabetes, or protein in the urine.  An ultrasound to confirm the proper growth and development of the baby.  An amniocentesis to check for possible genetic problems.  Fetal screens for spina bifida and Down syndrome. HOME CARE INSTRUCTIONS   Avoid all smoking, herbs, alcohol, and unprescribed drugs. These chemicals affect the formation and growth of the baby.  Follow your health care provider's instructions regarding medicine use. There are medicines that are either safe or unsafe to take during pregnancy.  Exercise only as directed by your health care provider. Experiencing uterine cramps is a good sign to stop exercising.  Continue to eat regular, healthy meals.  Wear a good support bra for breast tenderness.  Do not use hot tubs, steam rooms, or saunas.  Wear your seat belt at all times when driving.  Avoid raw meat, uncooked cheese, cat litter boxes, and soil used by cats. These carry germs that can cause birth defects in the baby.  Take your prenatal vitamins.  Try taking a stool softener (if your health care provider approves) if you develop constipation. Eat more high-fiber foods, such as fresh vegetables or fruit and whole grains. Drink plenty of fluids to keep your urine clear or pale yellow.  Take warm sitz baths to soothe any pain or discomfort caused by hemorrhoids. Use hemorrhoid cream if your health care provider approves.  If you develop varicose veins, wear support hose. Elevate your feet for 15 minutes, 3-4 times a day. Limit salt in your diet.  Avoid heavy lifting, wear low  heel shoes, and practice good posture.  Rest with your legs elevated if you have leg cramps or low back pain.  Visit your dentist if you have not gone yet during your pregnancy. Use a soft toothbrush to brush your teeth and be gentle when you floss.  A sexual relationship may be continued unless your health care provider directs you otherwise.  Continue to go to all your prenatal visits as directed by your health care provider. SEEK MEDICAL CARE IF:   You have dizziness.  You have mild pelvic cramps, pelvic pressure, or nagging pain in the abdominal area.  You have persistent nausea, vomiting, or diarrhea.  You have a bad smelling vaginal discharge.  You have pain with urination. SEEK IMMEDIATE MEDICAL CARE IF:   You have a fever.  You are leaking fluid from your vagina.  You have spotting or bleeding from  your vagina.  You have severe abdominal cramping or pain.  You have rapid weight gain or loss.  You have shortness of breath with chest pain.  You notice sudden or extreme swelling of your face, hands, ankles, feet, or legs.  You have not felt your baby move in over an hour.  You have severe headaches that do not go away with medicine.  You have vision changes. Document Released: 07/04/2001 Document Revised: 07/15/2013 Document Reviewed: 09/10/2012 [facility] Patient Information 2015 [place], [place]. This information is not intended to replace advice given to you by your health care provider. Make sure you discuss any questions you have with your health care provider.   Trichomoniasis Trichomoniasis is an STI (sexually transmitted infection) that can affect both women and men. In women, the outer area of the female genitalia (vulva) and the vagina are affected. In men, the penis is mainly affected, but the prostate and other reproductive organs can also be involved. This condition can be treated with medicine. It often has no symptoms (is asymptomatic), especially in  men. What are the causes? This condition is caused by an organism called Trichomonas vaginalis. Trichomoniasis most often spreads from person to person (is contagious) through sexual contact. What increases the risk? The following factors may make you more likely to develop this condition:  Having unprotected sexual intercourse.  Having sexual intercourse with a partner who has trichomoniasis.  Having multiple sexual partners.  Having had previous trichomoniasis infections or other STIs. What are the signs or symptoms? In women, symptoms of trichomoniasis include:  Abnormal vaginal discharge that is clear, white, gray, or yellow-green and foamy and has an unusual "fishy" odor.  Itching and irritation of the vagina and vulva.  Burning or pain during urination or sexual intercourse.  Genital redness and swelling. In men, symptoms of trichomoniasis include:  Penile discharge that may be foamy or contain pus.  Pain in the penis. This may happen only when urinating.  Itching or irritation inside the penis.  Burning after urination or ejaculation. How is this diagnosed? In women, this condition may be found during a routine Pap test or physical exam. It may be found in men during a routine physical exam. Your health care provider may perform tests to help diagnose this infection, such as:  Urine tests (men and women).  The following in women: ? Testing the pH of the vagina. ? A vaginal swab test that checks for the Trichomonas vaginalis organism. ? Testing vaginal secretions. Your health care provider may test you for other STIs, including HIV (human immunodeficiency virus). How is this treated? This condition is treated with medicine taken by mouth (orally), such as metronidazole or tinidazole to fight the infection. Your sexual partner(s) may also need to be tested and treated.  If you are a woman and you plan to become pregnant or think you may be pregnant, tell your health  care provider right away. Some medicines that are used to treat the infection should not be taken during pregnancy. Your health care provider may recommend over-the-counter medicines or creams to help relieve itching or irritation. You may be tested for infection again 3 months after treatment. Follow these instructions at home:  Take and use over-the-counter and prescription medicines, including creams, only as told by your health care provider.  Do not have sexual intercourse until one week after you finish your medicine, or until your health care provider approves. Ask your health care provider when you may resume sexual intercourse.  (  Women) Do not douche or wear tampons while you have the infection.  Discuss your infection with your sexual partner(s). Make sure that your partner gets tested and treated, if necessary.  Keep all follow-up visits as told by your health care provider. This is important. How is this prevented?  Use condoms every time you have sex. Using condoms correctly and consistently can help protect against STIs.  Avoid having multiple sexual partners.  Talk with your sexual partner about any symptoms that either of you may have, as well as any history of STIs.  Get tested for STIs and STDs (sexually transmitted diseases) before you have sex. Ask your partner to do the same.  Do not have sexual contact if you have symptoms of trichomoniasis or another STI. Contact a health care provider if:  You still have symptoms after you finish your medicine.  You develop pain in your abdomen.  You have pain when you urinate.  You have bleeding after sexual intercourse.  You develop a rash.  You feel nauseous or you vomit.  You plan to become pregnant or think you may be pregnant. Summary  Trichomoniasis is an STI (sexually transmitted infection) that can affect both women and men.  This condition often has no symptoms (is asymptomatic), especially in men.  You  should not have sexual intercourse until one week after you finish your medicine, or until your health care provider approves. Ask your health care provider when you may resume sexual intercourse.  Discuss your infection with your sexual partner. Make sure that your partner gets tested and treated, if necessary. This information is not intended to replace advice given to you by your health care provider. Make sure you discuss any questions you have with your health care provider. Document Released: 01/03/2001 Document Revised: 06/02/2016 Document Reviewed: 06/02/2016 Elsevier Interactive Patient Education  2019 [employer].  Coronavirus (COVID-19) Are you at risk?  Are you at risk for the Coronavirus (COVID-19)?  To be considered HIGH RISK for Coronavirus (COVID-19), you have to meet the following criteria:  . Traveled to [place], [place], [place], [place] or [place]; or in the [place] to [place], [place], [place], or [place]; and have fever, cough, and shortness of breath within the last 2 weeks of travel OR . Been in close contact with a person diagnosed with COVID-19 within the last 2 weeks and have fever, cough, and shortness of breath . IF YOU DO NOT MEET THESE CRITERIA, YOU ARE CONSIDERED LOW RISK FOR COVID-19.  What to do if you are HIGH RISK for COVID-19?  Marland Kitchen If you are having a medical emergency, call 911. . Seek medical care right away. Before you go to a doctor's office, urgent care or emergency department, call ahead and tell them about your recent travel, contact with someone diagnosed with COVID-19, and your symptoms. You should receive instructions from your physician's office regarding next steps of care.  . When you arrive at healthcare provider, tell the healthcare staff immediately you have returned from visiting [place], [place], [place], [place] or [place]; or traveled in the [place] to [place], [place], [place], or [place]; in the last two weeks or  you have been in close contact with a person diagnosed with COVID-19 in the last 2 weeks.   . Tell the health care staff about your symptoms: fever, cough and shortness of breath. . After you have been seen by a medical provider, you will be either: o Tested  for (COVID-19) and discharged home on quarantine except to seek medical care if symptoms worsen, and asked to  - Stay home and avoid contact with others until you get your results (4-5 days)  - Avoid travel on public transportation if possible (such as bus, train, or airplane) or o Sent to the Emergency Department by EMS for evaluation, COVID-19 testing, and possible admission depending on your condition and test results.  What to do if you are LOW RISK for COVID-19?  Reduce your risk of any infection by using the same precautions used for avoiding the common cold or flu:  Marland Kitchen Wash your hands often with soap and warm water for at least 20 seconds.  If soap and water are not readily available, use an alcohol-based hand sanitizer with at least 60% alcohol.  . If coughing or sneezing, cover your mouth and nose by coughing or sneezing into the elbow areas of your shirt or coat, into a tissue or into your sleeve (not your hands). . Avoid shaking hands with others and consider head nods or verbal greetings only. . Avoid touching your eyes, nose, or mouth with unwashed hands.  . Avoid close contact with people who are sick. . Avoid places or events with large numbers of people in one location, like concerts or sporting events. . Carefully consider travel plans you have or are making. . If you are planning any travel outside or inside the [place], visit the CDC's Travelers' Health webpage for the latest health notices. . If you have some symptoms but not all symptoms, continue to monitor at home and seek medical attention if your symptoms worsen. . If you are having a medical emergency, call 911.   ADDITIONAL HEALTHCARE OPTIONS FOR PATIENTS  [facility] Telehealth / e-Visit: [email or web address]         [facility]: [phone]  [facility]: [phone]                   [facility]: [phone]

## 2018-10-09 ENCOUNTER — Encounter: Payer: Self-pay | Admitting: Women's Health

## 2018-10-09 LAB — GC/CHLAMYDIA PROBE AMP
Chlamydia trachomatis, NAA: NEGATIVE
NEISSERIA GONORRHOEAE BY PCR: NEGATIVE

## 2018-10-15 ENCOUNTER — Telehealth: Payer: Self-pay | Admitting: Obstetrics & Gynecology

## 2018-10-15 NOTE — Telephone Encounter (Signed)
Called patient back she stated that last night she was having some back pain. She has been busy on her feet a lot. Pt denies bleeding, leaking or decreased fetal movement. Advised using tylenol prn, heat pad to back, and using pillows for support at night. Patient voiced understanding. Patient also asked about restrictions. Advised patient that currently we are seeing patients with no visitors or children.

## 2018-10-15 NOTE — Telephone Encounter (Signed)
Patient called, stated that she's 26 weeks, had bad back pain last night, having some now, but not as bad, no cramping, no bleeding.  Suggestions.  [phone]

## 2018-10-18 ENCOUNTER — Telehealth: Payer: Self-pay | Admitting: *Deleted

## 2018-10-18 NOTE — Telephone Encounter (Signed)
Patient with questions regarding fetal movement.  States the baby will have periods of frequent activity then periods of less activity and wants to make sure this is normal.  I informed the patient that yes, the baby may have different times of increased activity but what is normal for her baby may be different for someone else.  Informed kick counts will start at 28 and then she will be checking for 10 movements in 2 hours.  Pt verbalized understanding.

## 2018-10-24 ENCOUNTER — Telehealth: Payer: Self-pay | Admitting: *Deleted

## 2018-10-24 NOTE — Telephone Encounter (Signed)
Patient informed that we are not allowing visitors or children to come to appointments at this time. Patient denies any contact with anyone suspected or confirmed of having COVID-19. Pt denies fever, cough, sob, muscle pain, diarrhea, rash, vomiting, abdominal pain, red eye, weakness, bruising or bleeding, joint pain or severe headache.  

## 2018-10-25 ENCOUNTER — Other Ambulatory Visit: Payer: Medicaid Other

## 2018-10-25 ENCOUNTER — Other Ambulatory Visit: Payer: Self-pay

## 2018-10-25 ENCOUNTER — Ambulatory Visit (INDEPENDENT_AMBULATORY_CARE_PROVIDER_SITE_OTHER): Payer: Medicaid Other | Admitting: Obstetrics & Gynecology

## 2018-10-25 VITALS — BP 114/75 | HR 85 | Wt 282.0 lb

## 2018-10-25 DIAGNOSIS — Z3482 Encounter for supervision of other normal pregnancy, second trimester: Secondary | ICD-10-CM | POA: Diagnosis not present

## 2018-10-25 DIAGNOSIS — Z3A27 27 weeks gestation of pregnancy: Secondary | ICD-10-CM

## 2018-10-25 DIAGNOSIS — Z23 Encounter for immunization: Secondary | ICD-10-CM

## 2018-10-25 DIAGNOSIS — Z3402 Encounter for supervision of normal first pregnancy, second trimester: Secondary | ICD-10-CM

## 2018-10-25 DIAGNOSIS — J302 Other seasonal allergic rhinitis: Secondary | ICD-10-CM

## 2018-10-25 DIAGNOSIS — Z331 Pregnant state, incidental: Secondary | ICD-10-CM

## 2018-10-25 NOTE — Progress Notes (Signed)
   LOW-RISK PREGNANCY VISIT Patient name: Leah Kennedy MRN [id number]  Date of birth: Nov 13, 1995 Chief Complaint:   Routine Prenatal Visit (PN2)  History of Present Illness:   Leah Kennedy is a 23 y.o. G53P0010 female at [redacted]w[redacted]d with an Estimated Date of Delivery: 01/21/19 being seen today for ongoing management of a low-risk pregnancy.  Today she reports seasonal allergies. Contractions: Not present. Vag. Bleeding: None.  Movement: Present. denies leaking of fluid. Review of Systems:   Pertinent items are noted in HPI Denies abnormal vaginal discharge w/ itching/odor/irritation, headaches, visual changes, shortness of breath, chest pain, abdominal pain, severe nausea/vomiting, or problems with urination or bowel movements unless otherwise stated above. Pertinent History Reviewed:  Reviewed past medical,surgical, social, obstetrical and family history.  Reviewed problem list, medications and allergies. Physical Assessment:   Vitals:   10/25/18 0906  BP: 114/75  Pulse: 85  Weight: 282 lb (127.9 kg)  Body mass index is 45.52 kg/m.        Physical Examination:   General appearance: Well appearing, and in no distress  Mental status: Alert, oriented to person, place, and time  Skin: Warm & dry  Cardiovascular: Normal heart rate noted  Respiratory: Normal respiratory effort, no distress  Abdomen: Soft, gravid, nontender  Pelvic: Cervical exam deferred         Extremities: Edema: None  Fetal Status:     Movement: Present    No results found for this or any previous visit (from the past 24 hour(s)).  Assessment & Plan:  1) Low-risk pregnancy G2P0010 at [redacted]w[redacted]d with an Estimated Date of Delivery: 01/21/19   2) Seasonal allergies, recommended OTC loratiadine, OTC nasonex and zaditor allergy eye drops   Meds: No orders of the defined types were placed in this encounter.  Labs/procedures today:   Plan:  Continue routine obstetrical care  Pt does not have access to a BP cuff   Reviewed:Pre Term labor symptoms and general obstetric precautions including but not limited to vaginal bleeding, contractions, leaking of fluid and fetal movement were reviewed in detail with the patient.  All questions were answered  Follow-up: Return in about 3 weeks (around 11/15/2018) for LROB.  Orders Placed This Encounter  Procedures  . Tdap vaccine greater than or equal to 7yo IM   Lazaro Arms  10/25/2018 9:30 AM

## 2018-10-26 LAB — CBC
Hematocrit: 32.1 % — ABNORMAL LOW (ref 34.0–46.6)
Hemoglobin: 10.7 g/dL — ABNORMAL LOW (ref 11.1–15.9)
MCH: 28.2 pg (ref 26.6–33.0)
MCHC: 33.3 g/dL (ref 31.5–35.7)
MCV: 85 fL (ref 79–97)
Platelets: 334 10*3/uL (ref 150–450)
RBC: 3.79 x10E6/uL (ref 3.77–5.28)
RDW: 13.6 % (ref 11.7–15.4)
WBC: 12.2 10*3/uL — ABNORMAL HIGH (ref 3.4–10.8)

## 2018-10-26 LAB — HIV ANTIBODY (ROUTINE TESTING W REFLEX): HIV Screen 4th Generation wRfx: NONREACTIVE

## 2018-10-26 LAB — RPR: RPR Ser Ql: NONREACTIVE

## 2018-10-26 LAB — ANTIBODY SCREEN: Antibody Screen: NEGATIVE

## 2018-10-26 LAB — GLUCOSE TOLERANCE, 2 HOURS W/ 1HR
Glucose, 1 hour: 135 mg/dL (ref 65–179)
Glucose, 2 hour: 98 mg/dL (ref 65–152)
Glucose, Fasting: 71 mg/dL (ref 65–91)

## 2018-10-29 ENCOUNTER — Other Ambulatory Visit: Payer: Medicaid Other

## 2018-10-29 ENCOUNTER — Encounter: Payer: Medicaid Other | Admitting: Women's Health

## 2018-11-14 ENCOUNTER — Encounter: Payer: Self-pay | Admitting: *Deleted

## 2018-11-15 ENCOUNTER — Encounter: Payer: Self-pay | Admitting: Obstetrics & Gynecology

## 2018-11-15 ENCOUNTER — Other Ambulatory Visit: Payer: Self-pay

## 2018-11-15 ENCOUNTER — Ambulatory Visit (INDEPENDENT_AMBULATORY_CARE_PROVIDER_SITE_OTHER): Payer: Medicaid Other | Admitting: Obstetrics & Gynecology

## 2018-11-15 VITALS — BP 111/71 | HR 87 | Temp 98.6°F | Wt 286.0 lb

## 2018-11-15 DIAGNOSIS — Z3483 Encounter for supervision of other normal pregnancy, third trimester: Secondary | ICD-10-CM

## 2018-11-15 DIAGNOSIS — Z3A30 30 weeks gestation of pregnancy: Secondary | ICD-10-CM

## 2018-11-15 LAB — POCT URINALYSIS DIPSTICK OB
Blood, UA: NEGATIVE
Glucose, UA: NEGATIVE
Ketones, UA: NEGATIVE
Leukocytes, UA: NEGATIVE
Nitrite, UA: NEGATIVE

## 2018-11-15 NOTE — Progress Notes (Signed)
   LOW-RISK PREGNANCY VISIT Patient name: Leah Kennedy MRN [id number]  Date of birth: 05-25-1996 Chief Complaint:   Routine Prenatal Visit (no poc in chart trich. want female)  History of Present Illness:   Leah Kennedy is a 23 y.o. G3P0010 female at [redacted]w[redacted]d with an Estimated Date of Delivery: 01/21/19 being seen today for ongoing management of a low-risk pregnancy.  Today she reports no complaints. Contractions: Regular.  .  Movement: Present. denies leaking of fluid. Review of Systems:   Pertinent items are noted in HPI Denies abnormal vaginal discharge w/ itching/odor/irritation, headaches, visual changes, shortness of breath, chest pain, abdominal pain, severe nausea/vomiting, or problems with urination or bowel movements unless otherwise stated above. Pertinent History Reviewed:  Reviewed past medical,surgical, social, obstetrical and family history.  Reviewed problem list, medications and allergies. Physical Assessment:   Vitals:   11/15/18 0900  BP: 111/71  Pulse: 87  Temp: 98.6 F (37 C)  Weight: 286 lb (129.7 kg)  Body mass index is 46.16 kg/m.        Physical Examination:   General appearance: Well appearing, and in no distress  Mental status: Alert, oriented to person, place, and time  Skin: Warm & dry  Cardiovascular: Normal heart rate noted  Respiratory: Normal respiratory effort, no distress  Abdomen: Soft, gravid, nontender  Pelvic: Cervical exam deferred         Extremities: Edema: None  Fetal Status: Fetal Heart Rate (bpm): 146 Fundal Height: 32 cm Movement: Present    No results found for this or any previous visit (from the past 24 hour(s)).  Assessment & Plan:  1) Low-risk pregnancy G2P0010 at [redacted]w[redacted]d with an Estimated Date of Delivery: 01/21/19   Pt does have A BP cuff she can check her BP with, so next appt will be Webex visit   Meds: No orders of the defined types were placed in this encounter.  Labs/procedures today:   Plan:  Continue routine  obstetrical care   Reviewed: Preterm labor symptoms and general obstetric precautions including but not limited to vaginal bleeding, contractions, leaking of fluid and fetal movement were reviewed in detail with the patient.  All questions were answered  Follow-up: Return in about 2 weeks (around 11/29/2018) for 2 weeks for a Webex visit, 4 weeks in office visit.  Orders Placed This Encounter  Procedures  . POC Urinalysis Dipstick OB   Lazaro Arms  11/15/2018 9:34 AM

## 2018-11-28 ENCOUNTER — Encounter: Payer: Self-pay | Admitting: *Deleted

## 2018-11-29 ENCOUNTER — Encounter: Payer: Medicaid Other | Admitting: Obstetrics & Gynecology

## 2018-11-29 ENCOUNTER — Emergency Department (HOSPITAL_COMMUNITY)
Admission: EM | Admit: 2018-11-29 | Discharge: 2018-11-29 | Disposition: A | Discharge status: Home / Self Care | Payer: Medicaid Other | Attending: Emergency Medicine | Admitting: Emergency Medicine

## 2018-11-29 ENCOUNTER — Encounter (HOSPITAL_COMMUNITY): Payer: Self-pay

## 2018-11-29 ENCOUNTER — Other Ambulatory Visit: Payer: Self-pay

## 2018-11-29 DIAGNOSIS — S39012A Strain of muscle, fascia and tendon of lower back, initial encounter: Secondary | ICD-10-CM

## 2018-11-29 DIAGNOSIS — Z3A00 Weeks of gestation of pregnancy not specified: Secondary | ICD-10-CM | POA: Insufficient documentation

## 2018-11-29 DIAGNOSIS — R61 Generalized hyperhidrosis: Secondary | ICD-10-CM | POA: Diagnosis not present

## 2018-11-29 DIAGNOSIS — O9A219 Injury, poisoning and certain other consequences of external causes complicating pregnancy, unspecified trimester: Secondary | ICD-10-CM | POA: Insufficient documentation

## 2018-11-29 DIAGNOSIS — X58XXXA Exposure to other specified factors, initial encounter: Secondary | ICD-10-CM | POA: Diagnosis not present

## 2018-11-29 DIAGNOSIS — Y939 Activity, unspecified: Secondary | ICD-10-CM | POA: Diagnosis not present

## 2018-11-29 DIAGNOSIS — Y929 Unspecified place or not applicable: Secondary | ICD-10-CM | POA: Diagnosis not present

## 2018-11-29 DIAGNOSIS — Y999 Unspecified external cause status: Secondary | ICD-10-CM | POA: Diagnosis not present

## 2018-11-29 DIAGNOSIS — O99350 Diseases of the nervous system complicating pregnancy, unspecified trimester: Secondary | ICD-10-CM | POA: Diagnosis not present

## 2018-11-29 DIAGNOSIS — Z87891 Personal history of nicotine dependence: Secondary | ICD-10-CM | POA: Insufficient documentation

## 2018-11-29 LAB — URINALYSIS, ROUTINE W REFLEX MICROSCOPIC
Bacteria, UA: NONE SEEN
Bilirubin Urine: NEGATIVE
Glucose, UA: NEGATIVE mg/dL
Hgb urine dipstick: NEGATIVE
Ketones, ur: NEGATIVE mg/dL
Nitrite: NEGATIVE
Protein, ur: 30 mg/dL — AB
Specific Gravity, Urine: 1.028 (ref 1.005–1.030)
pH: 6 (ref 5.0–8.0)

## 2018-11-29 LAB — COMPREHENSIVE METABOLIC PANEL
ALT: 10 U/L (ref 0–44)
AST: 15 U/L (ref 15–41)
Albumin: 2.5 g/dL — ABNORMAL LOW (ref 3.5–5.0)
Alkaline Phosphatase: 79 U/L (ref 38–126)
Anion gap: 14 (ref 5–15)
BUN: <5 mg/dL — ABNORMAL LOW (ref 6–20)
CO2: 19 mmol/L — ABNORMAL LOW (ref 22–32)
Calcium: 8.7 mg/dL — ABNORMAL LOW (ref 8.9–10.3)
Chloride: 102 mmol/L (ref 98–111)
Creatinine, Ser: 0.6 mg/dL (ref 0.44–1.00)
GFR calc Af Amer: >60 mL/min (ref >60)
GFR calc non Af Amer: >60 mL/min (ref >60)
Glucose, Bld: 137 mg/dL — ABNORMAL HIGH (ref 70–99)
Potassium: 3.5 mmol/L (ref 3.5–5.1)
Sodium: 135 mmol/L (ref 135–145)
Total Bilirubin: 0.2 mg/dL — ABNORMAL LOW (ref 0.3–1.2)
Total Protein: 6.1 g/dL — ABNORMAL LOW (ref 6.5–8.1)

## 2018-11-29 LAB — CBC
HCT: 34.1 % — ABNORMAL LOW (ref 36.0–46.0)
Hemoglobin: 11.1 g/dL — ABNORMAL LOW (ref 12.0–15.0)
MCH: 28.2 pg (ref 26.0–34.0)
MCHC: 32.6 g/dL (ref 30.0–36.0)
MCV: 86.5 fL (ref 80.0–100.0)
Platelets: 324 10*3/uL (ref 150–400)
RBC: 3.94 MIL/uL (ref 3.87–5.11)
RDW: 13.2 % (ref 11.5–15.5)
WBC: 10.6 10*3/uL — ABNORMAL HIGH (ref 4.0–10.5)
nRBC: 0 % (ref 0.0–0.2)

## 2018-11-29 MED ORDER — LACTATED RINGERS IV BOLUS
1000.0000 mL | Freq: Once | INTRAVENOUS | Status: AC
  Administered 2018-11-29: 1000 mL via INTRAVENOUS

## 2018-11-29 NOTE — ED Notes (Signed)
Urine culture sent to lab with urine sample.  

## 2018-11-29 NOTE — ED Notes (Signed)
Pt given discharge instructions, follow up information and the opportunity to ask questions. Pt verbalized understanding. Pt discharged from the ED without incident.  

## 2018-11-29 NOTE — ED Notes (Signed)
ED Provider at bedside. 

## 2018-11-29 NOTE — Progress Notes (Signed)
Pt is a G2P0 @ 32wk3da, @[facility]  ED w/complaints of cold sweats, back pain and dizziness.   Fetal heart tones 145bpm, moderate variability, multiple accelerations greater then 15x15 and no decelerations. Reactive NST One contraction seen, pt has no complaints of contractions.  Dr Alysia Penna notified of pt, OB cleared at 0515.  Pt has WebEx visit scheduled with Dr Despina Hidden today. Reinforced to patient to keep appointment with him.

## 2018-11-29 NOTE — ED Provider Notes (Signed)
Emergency Department Provider Note   I have reviewed the triage vital signs and the nursing notes.   HISTORY  Chief Complaint Night Sweats and Back Pain   HPI Leah Kennedy is a 23 y.o. female who presents emergency department today for multiple issues.  Patient had some back pain a few days ago that has since resolved but this was pretty severe in nature.  Did not take anything for symptoms any kind of movement made it worse but laying still was fine.  Tonight she had episode of dizziness with some diaphoresis.  No fevers.  Has increased urination recently but she thinks is drinking more fluid than normal.  She called OB/GYN who told her to come here for further evaluation.  No abdominal pain, vaginal discharge, vaginal bleeding, change in fetal movement.   No other associated or modifying symptoms.    Past Medical History:  Diagnosis Date  . Depression   . Diabetes mellitus without complication (HCC)    pre-diabetes    Patient Active Problem List   Diagnosis Date Noted  . Trichomoniasis 10/08/2018  . Obesity affecting pregnancy, antepartum 08/08/2018  . Morbid obesity (HCC) 08/08/2018  . Chlamydia 07/15/2018  . Prediabetes 07/02/2018  . Supervision of normal pregnancy 07/01/2018    Past Surgical History:  Procedure Laterality Date  . TONSILLECTOMY        Allergies Carrot [daucus carota]  Family History  Problem Relation Age of Onset  . Stroke Father   . Alcohol abuse Father   . Pulmonary disease Maternal Grandmother   . Drug abuse Maternal Grandmother   . Alcohol abuse Maternal Grandfather   . Heart murmur Paternal Grandmother   . Hypertension Paternal Grandmother   . Diabetes Paternal Grandfather   . Gout Paternal Grandfather   . Kidney disease Paternal Grandfather   . Glaucoma Paternal Grandfather     Social History Social History   Tobacco Use  . Smoking status: Former Games developermoker  . Smokeless tobacco: Never Used  Substance Use Topics  . Alcohol  use: No    Frequency: Never  . Drug use: Not Currently    Types: Marijuana    Comment: before pregnancy    Review of Systems  All other systems negative except as documented in the HPI. All pertinent positives and negatives as reviewed in the HPI. ____________________________________________   PHYSICAL EXAM:  VITAL SIGNS: ED Triage Vitals  Enc Vitals Group     BP 11/29/18 0354 (!) 144/94     Pulse Rate 11/29/18 0354 (!) 110     Resp 11/29/18 0354 20     Temp 11/29/18 0354 98.2 F (36.8 C)     Temp Source 11/29/18 0354 Oral     SpO2 11/29/18 0354 98 %     Weight 11/29/18 0403 280 lb (127 kg)     Height 11/29/18 0403 5' 6.5" (1.689 m)    Constitutional: Alert and oriented. Well appearing and in no acute distress. Eyes: Conjunctivae are normal. PERRL. EOMI. Head: Atraumatic. Nose: No congestion/rhinnorhea. Mouth/Throat: Mucous membranes are moist.  Oropharynx non-erythematous. Neck: No stridor.  No meningeal signs.   Cardiovascular: Normal rate, regular rhythm. Good peripheral circulation. Grossly normal heart sounds.   Respiratory: Normal respiratory effort.  No retractions. Lungs CTAB. Gastrointestinal: Soft and nontender. No distention.  Musculoskeletal: No lower extremity tenderness nor edema. No gross deformities of extremities. Neurologic:  Normal speech and language. No gross focal neurologic deficits are appreciated.  Skin:  Skin is warm, dry and intact. No  rash noted.   ____________________________________________   LABS (all labs ordered are listed, but only abnormal results are displayed)  Labs Reviewed  CBC - Abnormal; Notable for the following components:      Result Value   WBC 10.6 (*)    Hemoglobin 11.1 (*)    HCT 34.1 (*)    All other components within normal limits  COMPREHENSIVE METABOLIC PANEL - Abnormal; Notable for the following components:   CO2 19 (*)    Glucose, Bld 137 (*)    BUN <5 (*)    Calcium 8.7 (*)    Total Protein 6.1 (*)     Albumin 2.5 (*)    Total Bilirubin 0.2 (*)    All other components within normal limits  URINALYSIS, ROUTINE W REFLEX MICROSCOPIC   ____________________________________________  RADIOLOGY  No results found.  ____________________________________________   PROCEDURES  Procedure(s) performed:   Procedures   ____________________________________________   INITIAL IMPRESSION / ASSESSMENT AND PLAN / ED COURSE  Unclear etiology, will give some fluids, eval for DM/HTN/UTI. RR nurse evaluated and stated was cleared Ob-wise.   Pending urine at time of care transfer. Likely discharge.    Pertinent labs & imaging results that were available during my care of the patient were reviewed by me and considered in my medical decision making (see chart for details).  ____________________________________________  FINAL CLINICAL IMPRESSION(S) / ED DIAGNOSES  Final diagnoses:  None     MEDICATIONS GIVEN DURING THIS VISIT:  Medications  lactated ringers bolus 1,000 mL (1,000 mLs Intravenous New Bag/Given 11/29/18 0500)     NEW OUTPATIENT MEDICATIONS STARTED DURING THIS VISIT:  New Prescriptions   No medications on file    Note:  This note was prepared with assistance of Dragon voice recognition software. Occasional wrong-word or sound-a-like substitutions may have occurred due to the inherent limitations of voice recognition software.   Benedicta Sultan, Barbara Cower, MD 11/29/18 [phone]

## 2018-11-29 NOTE — ED Notes (Signed)
OB RN called to exam patient-Monique,RN

## 2018-11-29 NOTE — ED Triage Notes (Signed)
Pt arrives a&ox  4;Pt states around 12 am she started to have cold sweats and dizziness; pt states she has been getting back pains x 2 days with no activity; Pt states she is [redacted] weeks pregnant; last OB seen on 11/15/2018; pt denies complications with pregnancy but has had 2 miscarriages in the past; Pt denies pain on arrival.-Monique,RN

## 2018-11-29 NOTE — ED Provider Notes (Signed)
Assumed care of patient from Dr. Clayborne Dana at 7 AM.  Patient pregnant but has been cleared by OB.  Has had some low back pain.  Overall unremarkable work-up.  Awaiting urinalysis and then okay for discharge to home and outpatient follow-up with OB.  Urinalysis overall without signs of infection.  Patient with some moderate leukocytes however no bacteria is seen and likely contamination given squamous epithelial cells.  Discharged from ED in good condition and understands follow-up with OB.  This chart was dictated using voice recognition software.  Despite best efforts to proofread,  errors can occur which can change the documentation meaning.     Virgina Norfolk, DO 11/29/18 [phone]

## 2018-12-03 ENCOUNTER — Encounter: Payer: Self-pay | Admitting: Women's Health

## 2018-12-03 ENCOUNTER — Other Ambulatory Visit: Payer: Self-pay

## 2018-12-03 ENCOUNTER — Ambulatory Visit (INDEPENDENT_AMBULATORY_CARE_PROVIDER_SITE_OTHER): Payer: Medicaid Other | Admitting: Women's Health

## 2018-12-03 VITALS — BP 156/91 | HR 54 | Wt 287.0 lb

## 2018-12-03 DIAGNOSIS — Z3A33 33 weeks gestation of pregnancy: Secondary | ICD-10-CM

## 2018-12-03 DIAGNOSIS — R030 Elevated blood-pressure reading, without diagnosis of hypertension: Secondary | ICD-10-CM

## 2018-12-03 DIAGNOSIS — Z3483 Encounter for supervision of other normal pregnancy, third trimester: Secondary | ICD-10-CM

## 2018-12-03 NOTE — Patient Instructions (Signed)
Leah SahaAureana Kennedy, I greatly value your feedback.  If you receive a survey following your visit with us today, we appreciate you taking the time to fill it out.  Thanks, Leah HaffKim Arianna Delsanto, CNM, [facility]NP[facility]BC  [facility] is now [facility] ([address] [place], [place] [address]) Entrance located off of [address] Free 24/7 valet parking   Home Blood Pressure Monitoring for Patients   Check your blood pressure 4 times daily and keep a log, bring this with you to all appointments.  If blood pressure is >=160 on top or >=110 on bottom, check again in 5 minutes, if still this high, call us or go to [facility] to be evaluated   Helpful Tips for Accurate Home Blood Pressure Checks  . Don't smoke, exercise, or drink caffeine 30 minutes before checking your BP . Use the restroom before checking your BP (a full bladder can raise your pressure) . Relax in a comfortable upright chair . Feet on the ground . Left arm resting comfortably on a flat surface at the level of your heart . Legs uncrossed . Back supported . Sit quietly and don't talk . Place the cuff on your bare arm . Adjust snuggly, so that only two fingertips can fit between your skin and the top of the cuff . Check 2 readings separated by at least one minute . Keep a log of your BP readings . For a visual, please reference this diagram: http://ccnc.care/bpdiagram  Provider Name: [facility]     Phone: [phone]  Zone 1: ALL CLEAR  Continue to monitor your symptoms:  . BP reading is less than 140 (top number) or less than 90 (bottom number)  . No right upper stomach pain . No headaches or seeing spots . No feeling nauseated or throwing up . No swelling in face and hands  Zone 2: CAUTION Call your doctor's office for any of the following:  . BP reading is greater than 140 (top number) or greater than 90 (bottom number)  . Stomach pain under your ribs in the middle or  right side . Headaches or seeing spots . Feeling nauseated or throwing up . Swelling in face and hands  Zone 3: EMERGENCY  Seek immediate medical care if you have any of the following:  . BP reading is greater than160 (top number) or greater than 110 (bottom number) . Severe headaches not improving with Tylenol . Serious difficulty catching your breath . Any worsening symptoms from Zone 2  Call the office [phone]) or go to [facility] for these signs of pre-eclampsia:  Severe headache that does not go away with Tylenol  Visual changes- seeing spots, double, blurred vision  Pain under your right breast or upper abdomen that does not go away with Tums or heartburn medicine  Nausea and/or vomiting  Severe swelling in your hands, feet, and face         Call the office [phone]) or go to [facility] if:  You begin to have strong, frequent contractions  Your water breaks.  Sometimes it is a big gush of fluid, sometimes it is just a trickle that keeps getting your panties wet or running down your legs  You have vaginal bleeding.  It is normal to have a small amount of spotting if your cervix was checked.   You don't feel your baby moving like normal.  If you don't, get you something to eat and drink and  lay down and focus on feeling your baby move.  You should feel at least 10 movements in 2 hours.  If you don't, you should call the office or go to [facility].    Tdap Vaccine  It is recommended that you get the Tdap vaccine during the third trimester of EACH pregnancy to help protect your baby from getting pertussis (whooping cough)  27-36 weeks is the BEST time to do this so that you can pass the protection on to your baby. During pregnancy is better than after pregnancy, but if you are unable to get it during pregnancy it will be offered at the hospital.   You can get this vaccine with [place], at the health department, your family doctor, or some local  pharmacies  Everyone who will be around your baby should also be up-to-date on their vaccines before the baby comes. Adults (who are not pregnant) only need 1 dose of Tdap during adulthood.   Third Trimester of Pregnancy The third trimester is from week 29 through week 42, months 7 through 9. The third trimester is a time when the fetus is growing rapidly. At the end of the ninth month, the fetus is about 20 inches in length and weighs 6-10 pounds.  BODY CHANGES Your body goes through many changes during pregnancy. The changes vary from woman to woman.   Your weight will continue to increase. You can expect to gain 25-35 pounds (11-16 kg) by the end of the pregnancy.  You may begin to get stretch marks on your hips, abdomen, and breasts.  You may urinate more often because the fetus is moving lower into your pelvis and pressing on your bladder.  You may develop or continue to have heartburn as a result of your pregnancy.  You may develop constipation because certain hormones are causing the muscles that push waste through your intestines to slow down.  You may develop hemorrhoids or swollen, bulging veins (varicose veins).  You may have pelvic pain because of the weight gain and pregnancy hormones relaxing your joints between the bones in your pelvis. Backaches may result from overexertion of the muscles supporting your posture.  You may have changes in your hair. These can include thickening of your hair, rapid growth, and changes in texture. Some women also have hair loss during or after pregnancy, or hair that feels dry or thin. Your hair will most likely return to normal after your baby is born.  Your breasts will continue to grow and be tender. A yellow discharge may leak from your breasts called colostrum.  Your belly button may stick out.  You may feel short of breath because of your expanding uterus.  You may notice the fetus "dropping," or moving lower in your abdomen.  You  may have a bloody mucus discharge. This usually occurs a few days to a week before labor begins.  Your cervix becomes thin and soft (effaced) near your due date. WHAT TO EXPECT AT YOUR PRENATAL EXAMS  You will have prenatal exams every 2 weeks until week 36. Then, you will have weekly prenatal exams. During a routine prenatal visit:  You will be weighed to make sure you and the fetus are growing normally.  Your blood pressure is taken.  Your abdomen will be measured to track your baby's growth.  The fetal heartbeat will be listened to.  Any test results from the previous visit will be discussed.  You may have a cervical check near your due date  to see if you have effaced. At around 36 weeks, your caregiver will check your cervix. At the same time, your caregiver will also perform a test on the secretions of the vaginal tissue. This test is to determine if a type of bacteria, Group B streptococcus, is present. Your caregiver will explain this further. Your caregiver may ask you:  What your birth plan is.  How you are feeling.  If you are feeling the baby move.  If you have had any abnormal symptoms, such as leaking fluid, bleeding, severe headaches, or abdominal cramping.  If you have any questions. Other tests or screenings that may be performed during your third trimester include:  Blood tests that check for low iron levels (anemia).  Fetal testing to check the health, activity level, and growth of the fetus. Testing is done if you have certain medical conditions or if there are problems during the pregnancy. FALSE LABOR You may feel small, irregular contractions that eventually go away. These are called Braxton Hicks contractions, or false labor. Contractions may last for hours, days, or even weeks before true labor sets in. If contractions come at regular intervals, intensify, or become painful, it is best to be seen by your caregiver.  SIGNS OF LABOR   Menstrual-like  cramps.  Contractions that are 5 minutes apart or less.  Contractions that start on the top of the uterus and spread down to the lower abdomen and back.  A sense of increased pelvic pressure or back pain.  A watery or bloody mucus discharge that comes from the vagina. If you have any of these signs before the 37th week of pregnancy, call your caregiver right away. You need to go to the hospital to get checked immediately. HOME CARE INSTRUCTIONS   Avoid all smoking, herbs, alcohol, and unprescribed drugs. These chemicals affect the formation and growth of the baby.  Follow your caregiver's instructions regarding medicine use. There are medicines that are either safe or unsafe to take during pregnancy.  Exercise only as directed by your caregiver. Experiencing uterine cramps is a good sign to stop exercising.  Continue to eat regular, healthy meals.  Wear a good support bra for breast tenderness.  Do not use hot tubs, steam rooms, or saunas.  Wear your seat belt at all times when driving.  Avoid raw meat, uncooked cheese, cat litter boxes, and soil used by cats. These carry germs that can cause birth defects in the baby.  Take your prenatal vitamins.  Try taking a stool softener (if your caregiver approves) if you develop constipation. Eat more high-fiber foods, such as fresh vegetables or fruit and whole grains. Drink plenty of fluids to keep your urine clear or pale yellow.  Take warm sitz baths to soothe any pain or discomfort caused by hemorrhoids. Use hemorrhoid cream if your caregiver approves.  If you develop varicose veins, wear support hose. Elevate your feet for 15 minutes, 3-4 times a day. Limit salt in your diet.  Avoid heavy lifting, wear low heal shoes, and practice good posture.  Rest a lot with your legs elevated if you have leg cramps or low back pain.  Visit your dentist if you have not gone during your pregnancy. Use a soft toothbrush to brush your teeth and  be gentle when you floss.  A sexual relationship may be continued unless your caregiver directs you otherwise.  Do not travel far distances unless it is absolutely necessary and only with the approval of your caregiver.  Take prenatal classes to understand, practice, and ask questions about the labor and delivery.  Make a trial run to the hospital.  Pack your hospital bag.  Prepare the baby's nursery.  Continue to go to all your prenatal visits as directed by your caregiver. SEEK MEDICAL CARE IF:  You are unsure if you are in labor or if your water has broken.  You have dizziness.  You have mild pelvic cramps, pelvic pressure, or nagging pain in your abdominal area.  You have persistent nausea, vomiting, or diarrhea.  You have a bad smelling vaginal discharge.  You have pain with urination. SEEK IMMEDIATE MEDICAL CARE IF:   You have a fever.  You are leaking fluid from your vagina.  You have spotting or bleeding from your vagina.  You have severe abdominal cramping or pain.  You have rapid weight loss or gain.  You have shortness of breath with chest pain.  You notice sudden or extreme swelling of your face, hands, ankles, feet, or legs.  You have not felt your baby move in over an hour.  You have severe headaches that do not go away with medicine.  You have vision changes. Document Released: 07/04/2001 Document Revised: 07/15/2013 Document Reviewed: 09/10/2012 [facility] Patient Information 2015 [place], [place]. This information is not intended to replace advice given to you by your health care provider. Make sure you discuss any questions you have with your health care provider.

## 2018-12-03 NOTE — Progress Notes (Signed)
   TELEHEALTH VIRTUAL OBSTETRICS VISIT ENCOUNTER NOTE Patient name: Leah Kennedy MRN [id number]  Date of birth: 11-08-1995  I connected with patient on 12/03/18 at 11:00 AM EDT by [facility] and verified that I am speaking with the correct person using two identifiers. Due to COVID-19 recommendations, pt is not currently in our office.    I discussed the limitations, risks, security and privacy concerns of performing an evaluation and management service by telephone and the availability of in person appointments. I also discussed with the patient that there may be a patient responsible charge related to this service. The patient expressed understanding and agreed to proceed.  Chief Complaint:   Routine Prenatal Visit  History of Present Illness:   Leah Kennedy is a 23 y.o. G28P0010 female at [redacted]w[redacted]d with an Estimated Date of Delivery: 01/21/19 being evaluated today for ongoing management of a low-risk pregnancy.  Today she reports no complaints. Last night bp 117/88. Denies ha, visual changes, ruq/epigastric pain, n/v.     Contractions: Not present.  .  Movement: Present. denies leaking of fluid. Review of Systems:   Pertinent items are noted in HPI Denies abnormal vaginal discharge w/ itching/odor/irritation, headaches, visual changes, shortness of breath, chest pain, abdominal pain, severe nausea/vomiting, or problems with urination or bowel movements unless otherwise stated above. Pertinent History Reviewed:  Reviewed past medical,surgical, social, obstetrical and family history.  Reviewed problem list, medications and allergies. Physical Assessment:   Vitals:   12/03/18 1058 12/03/18 1102  BP: (!) 152/76 (!) 156/91  Pulse: (!) 54   Weight: 287 lb (130.2 kg)   Body mass index is 45.63 kg/m.        Physical Examination:   General:  Alert, oriented and cooperative.   Mental Status: Normal mood and affect perceived. Normal judgment and thought content.  Rest of physical exam deferred due  to type of encounter  No results found for this or any previous visit (from the past 24 hour(s)).  Assessment & Plan:  1) Pregnancy G2P0010 at [redacted]w[redacted]d with an Estimated Date of Delivery: 01/21/19   2) Elevated bp, asymptomatic, come to lab today for pre-e labs, f/u in office in 2d for bp check w/ nurse. Check bp QID, bring log in 2d. Reviewed pre-e s/s, reasons to seek care   Meds: No orders of the defined types were placed in this encounter.   Labs/procedures today: cbc, cmp, p:c ratio  Plan:  Continue routine obstetrical care. Has BP cuff.   Reviewed: Preterm labor symptoms and general obstetric precautions including but not limited to vaginal bleeding, contractions, leaking of fluid and fetal movement were reviewed in detail with the patient. The patient was advised to call back or seek an in-person office evaluation/go [facility] for any urgent or concerning symptoms. All questions were answered. Please refer to After Visit Summary for other counseling recommendations.   I provided 15 minutes of non-face-to-face time during this encounter.  Follow-up: Return in about 2 days (around 12/05/2018) for bp check with nurse.  Orders Placed This Encounter  Procedures  . Comprehensive metabolic panel  . CBC  . Protein / creatinine ratio, urine   Cheral Marker CNM, [facility] 12/03/2018 11:19 AM

## 2018-12-04 DIAGNOSIS — Z3483 Encounter for supervision of other normal pregnancy, third trimester: Secondary | ICD-10-CM | POA: Diagnosis not present

## 2018-12-04 DIAGNOSIS — R030 Elevated blood-pressure reading, without diagnosis of hypertension: Secondary | ICD-10-CM | POA: Diagnosis not present

## 2018-12-05 ENCOUNTER — Encounter: Payer: Self-pay | Admitting: *Deleted

## 2018-12-05 ENCOUNTER — Ambulatory Visit: Payer: Medicaid Other | Admitting: *Deleted

## 2018-12-05 ENCOUNTER — Other Ambulatory Visit: Payer: Self-pay

## 2018-12-05 VITALS — BP 121/77 | HR 99 | Temp 98.7°F | Ht 67.0 in | Wt 288.0 lb

## 2018-12-05 DIAGNOSIS — Z331 Pregnant state, incidental: Secondary | ICD-10-CM

## 2018-12-05 DIAGNOSIS — Z0130 Encounter for examination of blood pressure without abnormal findings: Secondary | ICD-10-CM

## 2018-12-05 DIAGNOSIS — Z1389 Encounter for screening for other disorder: Secondary | ICD-10-CM

## 2018-12-05 LAB — POCT URINALYSIS DIPSTICK OB
Blood, UA: NEGATIVE
Glucose, UA: NEGATIVE
Ketones, UA: NEGATIVE
Nitrite, UA: NEGATIVE
POC,PROTEIN,UA: NEGATIVE

## 2018-12-05 LAB — CBC
Hematocrit: 32.8 % — ABNORMAL LOW (ref 34.0–46.6)
Hemoglobin: 11.2 g/dL (ref 11.1–15.9)
MCH: 27.9 pg (ref 26.6–33.0)
MCHC: 34.1 g/dL (ref 31.5–35.7)
MCV: 82 fL (ref 79–97)
Platelets: 301 10*3/uL (ref 150–450)
RBC: 4.01 x10E6/uL (ref 3.77–5.28)
RDW: 12.9 % (ref 11.7–15.4)
WBC: 11 10*3/uL — ABNORMAL HIGH (ref 3.4–10.8)

## 2018-12-05 LAB — COMPREHENSIVE METABOLIC PANEL
ALT: 8 IU/L (ref 0–32)
AST: 11 IU/L (ref 0–40)
Albumin/Globulin Ratio: 1.3 (ref 1.2–2.2)
Albumin: 3.3 g/dL — ABNORMAL LOW (ref 3.9–5.0)
Alkaline Phosphatase: 89 IU/L (ref 39–117)
BUN/Creatinine Ratio: 10 (ref 9–23)
BUN: 5 mg/dL — ABNORMAL LOW (ref 6–20)
Bilirubin Total: <0.2 mg/dL (ref 0.0–1.2)
CO2: 19 mmol/L — ABNORMAL LOW (ref 20–29)
Calcium: 8.6 mg/dL — ABNORMAL LOW (ref 8.7–10.2)
Chloride: 105 mmol/L (ref 96–106)
Creatinine, Ser: 0.51 mg/dL — ABNORMAL LOW (ref 0.57–1.00)
GFR calc Af Amer: 160 mL/min/{1.73_m2} (ref >59)
GFR calc non Af Amer: 139 mL/min/{1.73_m2} (ref >59)
Globulin, Total: 2.6 g/dL (ref 1.5–4.5)
Glucose: 76 mg/dL (ref 65–99)
Potassium: 4.1 mmol/L (ref 3.5–5.2)
Sodium: 137 mmol/L (ref 134–144)
Total Protein: 5.9 g/dL — ABNORMAL LOW (ref 6.0–8.5)

## 2018-12-05 LAB — PROTEIN / CREATININE RATIO, URINE
Creatinine, Urine: 69.6 mg/dL
Protein, Ur: 7.5 mg/dL
Protein/Creat Ratio: 108 mg/g creat (ref 0–200)

## 2018-12-05 NOTE — Progress Notes (Signed)
Pt here for BP check. BP was 121/77. Selena Batten, CNM reviewed chart and advised labs good. Come back on Monday for BP check and bring cuff with her. Keep checking BP over the weekend. If any preeclampsia symptoms, [facility]. Pt voiced understanding. JSY

## 2018-12-09 ENCOUNTER — Other Ambulatory Visit: Payer: Self-pay | Admitting: Women's Health

## 2018-12-09 ENCOUNTER — Ambulatory Visit: Payer: Medicaid Other

## 2018-12-09 ENCOUNTER — Other Ambulatory Visit: Payer: Self-pay

## 2018-12-09 VITALS — BP 127/70 | HR 95 | Temp 98.7°F | Ht 66.0 in | Wt 289.0 lb

## 2018-12-09 DIAGNOSIS — Z0130 Encounter for examination of blood pressure without abnormal findings: Secondary | ICD-10-CM

## 2018-12-09 DIAGNOSIS — Z1389 Encounter for screening for other disorder: Secondary | ICD-10-CM

## 2018-12-09 DIAGNOSIS — Z331 Pregnant state, incidental: Secondary | ICD-10-CM

## 2018-12-09 LAB — POCT URINALYSIS DIPSTICK OB
Blood, UA: NEGATIVE
Glucose, UA: NEGATIVE
Ketones, UA: NEGATIVE
Leukocytes, UA: NEGATIVE
Nitrite, UA: NEGATIVE
POC,PROTEIN,UA: NEGATIVE

## 2018-12-09 MED ORDER — BLOOD PRESSURE MONITOR MISC: 0 refills | Status: DC

## 2018-12-09 NOTE — Progress Notes (Signed)
Pt her for blood pressure check 120/70 pulse 95. Here b/p cuff from home 146/95 pulse 93. Spoke kim booker about reading. Selena Batten think it here cuff from home. Will order a new cuff.Pad CMA

## 2018-12-10 ENCOUNTER — Telehealth: Payer: Self-pay | Admitting: *Deleted

## 2018-12-10 NOTE — Telephone Encounter (Signed)
So no in office visit and self quarantine for 2 weeks

## 2018-12-10 NOTE — Telephone Encounter (Signed)
Pt called and stated that someone that her boyfriend works with has tested positive for COVID-19. Patients BF does not work on same shift but on same equipment. Pt unsure of cleaning procedures between shifts or PPE used. BF is not showing any symptoms currently but patient does see him daily. She wanted to know if there is anything she should do at this time. Also she is scheduled for in office visit on Friday. She wanted to know if she can still come or what needs to happen. Advised patient I will send this message to a provider and call her back later today.

## 2018-12-10 NOTE — Telephone Encounter (Signed)
Pt aware to self quarantine, Friday visit switched to webex.

## 2018-12-13 ENCOUNTER — Encounter: Payer: Self-pay | Admitting: Obstetrics & Gynecology

## 2018-12-13 ENCOUNTER — Ambulatory Visit (INDEPENDENT_AMBULATORY_CARE_PROVIDER_SITE_OTHER): Payer: Medicaid Other | Admitting: Obstetrics & Gynecology

## 2018-12-13 ENCOUNTER — Other Ambulatory Visit: Payer: Self-pay

## 2018-12-13 VITALS — BP 144/60 | HR 78 | Wt 288.0 lb

## 2018-12-13 DIAGNOSIS — O133 Gestational [pregnancy-induced] hypertension without significant proteinuria, third trimester: Secondary | ICD-10-CM | POA: Diagnosis not present

## 2018-12-13 DIAGNOSIS — Z3403 Encounter for supervision of normal first pregnancy, third trimester: Secondary | ICD-10-CM

## 2018-12-13 DIAGNOSIS — Z3A34 34 weeks gestation of pregnancy: Secondary | ICD-10-CM

## 2018-12-13 NOTE — Progress Notes (Signed)
   TELEHEALTH VIRTUAL OBSTETRICS VISIT ENCOUNTER NOTE  I connected with Leah Kennedy on 12/13/18 at  9:15 AM EDT by telephone at home and verified that I am speaking with the correct person using two identifiers.   I discussed the limitations, risks, security and privacy concerns of performing an evaluation and management service by telephone and the availability of in person appointments. I also discussed with the patient that there may be a patient responsible charge related to this service. The patient expressed understanding and agreed to proceed.  Subjective:  Leah Kennedy is a 23 y.o. G2P0010 at [redacted]w[redacted]d being followed for ongoing prenatal care.  She is currently monitored for the following issues for this low-risk pregnancy and has Supervision of normal pregnancy; Prediabetes; Chlamydia; Obesity affecting pregnancy, antepartum; Morbid obesity (HCC); and Trichomoniasis on their problem list.  Patient reports hemorrhoids, needs root cananl. Reports fetal movement. Denies any contractions, bleeding or leaking of fluid.   The following portions of the patient's history were reviewed and updated as appropriate: allergies, current medications, past family history, past medical history, past social history, past surgical history and problem list.   Objective:   General:  Alert, oriented and cooperative.   Mental Status: Normal mood and affect perceived. Normal judgment and thought content.  Rest of physical exam deferred due to type of encounter  Assessment and Plan:  Pregnancy: G2P0010 at [redacted]w[redacted]d There are no diagnoses linked to this encounter. Preterm labor symptoms and general obstetric precautions including but not limited to vaginal bleeding, contractions, leaking of fluid and fetal movement were reviewed in detail with the patient.  I discussed the assessment and treatment plan with the patient. The patient was provided an opportunity to ask questions and all were answered. The patient  agreed with the plan and demonstrated an understanding of the instructions. The patient was advised to call back or seek an in-person office evaluation/go to MAU at [facility] for any urgent or concerning symptoms. Please refer to After Visit Summary for other counseling recommendations.   I provided 12 minutes of non-face-to-face time during this encounter.  Return in about 1 week (around 12/20/2018) for in office, LROB. To check BP and fetal  No future appointments.  Lazaro Arms, MD [facility] [employer], [facility]

## 2018-12-18 ENCOUNTER — Encounter: Payer: Self-pay | Admitting: *Deleted

## 2018-12-18 ENCOUNTER — Telehealth: Payer: Self-pay | Admitting: Women's Health

## 2018-12-18 ENCOUNTER — Telehealth: Payer: Self-pay | Admitting: *Deleted

## 2018-12-18 NOTE — Telephone Encounter (Signed)
Patient states she is having "lightening pains".  Feels like the  baby has dropped down more and is having more pressure.  No contractions noted at this time.  Is uncomfortable to move at times.  Informed I could move her appt up in the morning but if anything changed throughout the night, to go to the hospital.  Pt verbalized understanding.

## 2018-12-18 NOTE — Telephone Encounter (Signed)
Pt is 35 weeks has appt tomorrow has some concerns please call

## 2018-12-19 ENCOUNTER — Encounter: Payer: Self-pay | Admitting: Women's Health

## 2018-12-19 ENCOUNTER — Other Ambulatory Visit: Payer: Self-pay

## 2018-12-19 ENCOUNTER — Ambulatory Visit (INDEPENDENT_AMBULATORY_CARE_PROVIDER_SITE_OTHER): Payer: Medicaid Other | Admitting: Women's Health

## 2018-12-19 VITALS — BP 120/82 | HR 82 | Wt 297.2 lb

## 2018-12-19 DIAGNOSIS — Z1389 Encounter for screening for other disorder: Secondary | ICD-10-CM

## 2018-12-19 DIAGNOSIS — K649 Unspecified hemorrhoids: Secondary | ICD-10-CM

## 2018-12-19 DIAGNOSIS — Z3483 Encounter for supervision of other normal pregnancy, third trimester: Secondary | ICD-10-CM

## 2018-12-19 DIAGNOSIS — Z3A35 35 weeks gestation of pregnancy: Secondary | ICD-10-CM

## 2018-12-19 DIAGNOSIS — N644 Mastodynia: Secondary | ICD-10-CM

## 2018-12-19 DIAGNOSIS — Z331 Pregnant state, incidental: Secondary | ICD-10-CM

## 2018-12-19 DIAGNOSIS — Z3490 Encounter for supervision of normal pregnancy, unspecified, unspecified trimester: Secondary | ICD-10-CM | POA: Diagnosis not present

## 2018-12-19 LAB — POCT URINALYSIS DIPSTICK OB
Blood, UA: NEGATIVE
Glucose, UA: NEGATIVE
Ketones, UA: NEGATIVE
Nitrite, UA: NEGATIVE
POC,PROTEIN,UA: NEGATIVE

## 2018-12-19 MED ORDER — BLOOD PRESSURE MONITOR MISC: 0 refills | Status: DC

## 2018-12-19 NOTE — Progress Notes (Signed)
   LOW-RISK PREGNANCY VISIT Patient name: Leah Kennedy MRN [id number]  Date of birth: 03-Feb-1996 Chief Complaint:   Routine Prenatal Visit  History of Present Illness:   Leah Kennedy is a 23 y.o. G17P0010 female at [redacted]w[redacted]d with an Estimated Date of Delivery: 01/21/19 being seen today for ongoing management of a low-risk pregnancy.  Today she reports Lt nipple pain, hemorrhoids- no constipation, hasn't tried anything. Had some earlier elevated bp readings w/ her home cuff, brought to office, her cuff was definitely off. Hasn't received new cuff yet.  Contractions: Not present.  .  Movement: Present. denies leaking of fluid. Review of Systems:   Pertinent items are noted in HPI Denies abnormal vaginal discharge w/ itching/odor/irritation, headaches, visual changes, shortness of breath, chest pain, abdominal pain, severe nausea/vomiting, or problems with urination or bowel movements unless otherwise stated above. Pertinent History Reviewed:  Reviewed past medical,surgical, social, obstetrical and family history.  Reviewed problem list, medications and allergies. Physical Assessment:   Vitals:   12/19/18 1002  BP: 120/82  Pulse: 82  Weight: 297 lb 3.2 oz (134.8 kg)  Body mass index is 47.97 kg/m.        Physical Examination:   General appearance: Well appearing, and in no distress  Mental status: Alert, oriented to person, place, and time  Skin: Warm & dry  Cardiovascular: Normal heart rate noted  Respiratory: Normal respiratory effort, no distress  Lt BREAST: appears normal, no redness, s/s infection  Abdomen: Soft, gravid, nontender  Pelvic: Cervical exam deferred         Extremities: Edema: None  Fetal Status: Fetal Heart Rate (bpm): 144 Fundal Height: 36 cm Movement: Present    No results found for this or any previous visit (from the past 24 hour(s)).  Assessment & Plan:  1) Low-risk pregnancy G2P0010 at [redacted]w[redacted]d with an Estimated Date of Delivery: 01/21/19   2) Lt nipple pain,  rx APNO, if not improved let us know  3) Hemorrhoids> otc hemorrhoid creams, if not improved let us know  4) Trichomonas> in March, needs POC- will do next week w/ gbs   Meds: No orders of the defined types were placed in this encounter.  Labs/procedures today: none  Plan:  Continue routine obstetrical care   Reviewed: Preterm labor symptoms and general obstetric precautions including but not limited to vaginal bleeding, contractions, leaking of fluid and fetal movement were reviewed in detail with the patient.  All questions were answered  Faxed rx for bp cuff & APNO to [facility] today  Follow-up: Return in about 1 week (around 12/26/2018) for LROB in office, gbs & POC.  No orders of the defined types were placed in this encounter.  Cheral Marker CNM, [facility] 12/19/2018 10:39 AM

## 2018-12-19 NOTE — Patient Instructions (Addendum)
Leah Kennedy, I greatly value your feedback.  If you receive a survey following your visit with us today, we appreciate you taking the time to fill it out.  Thanks, Joellyn HaffKim Abimelec Grochowski, CNM, [facility]  [facility] M[facility]!!! It is now [facility] ([address] [place], [place] [id number]) Entrance located off of [address] Free 24/7 valet parking   Preparation H, witch hazel for hemorrhoids Tips to Help You Sleep Better:   Get into a bedtime routine, try to do the same thing every night before going to bed to try to help your body wind down  Warm baths  Avoid caffeine for at least 3 hours before going to sleep   Keep your room at a slightly cooler temperature, can try running a fan  Turn off TV, lights, phone, electronics  Lots of pillows if needed to help you get comfortable  Lavender scented items can help you sleep. You can place lavender essential oil on a cotton ball and place under your pillowcase, or place in a diffuser. [employer] has a lavender scented sleep line (plug-ins, sprays, etc). Look in the pillow aisle for lavender scented pillows.   If none of the above things help, you can try 1/2 to 1 tablet of benadryl, unisom, or tylenol pm. Do not take this every night, only when you really need it.    Home Blood Pressure Monitoring for Patients   Your provider has recommended that you check your blood pressure (BP) at least once a week at home. If you do not have a blood pressure cuff at home, one will be provided for you. Contact your provider if you have not received your monitor within 1 week.   Helpful Tips for Accurate Home Blood Pressure Checks   Don't smoke, exercise, or drink caffeine 30 minutes before checking your BP  Use the restroom before checking your BP (a full bladder can raise your pressure)  Relax in a comfortable upright chair  Feet on the ground  Left arm resting comfortably on a flat surface at the level of your  heart  Legs uncrossed  Back supported  Sit quietly and don't talk  Place the cuff on your bare arm  Adjust snuggly, so that only two fingertips can fit between your skin and the top of the cuff  Check 2 readings separated by at least one minute  Keep a log of your BP readings  For a visual, please reference this diagram: http://ccnc.care/bpdiagram  Provider Name: [facility]     Phone: [phone]  Zone 1: ALL CLEAR  Continue to monitor your symptoms:   BP reading is less than 140 (top number) or less than 90 (bottom number)   No right upper stomach pain  No headaches or seeing spots  No feeling nauseated or throwing up  No swelling in face and hands  Zone 2: CAUTION Call your doctor's office for any of the following:   BP reading is greater than 140 (top number) or greater than 90 (bottom number)   Stomach pain under your ribs in the middle or right side  Headaches or seeing spots  Feeling nauseated or throwing up  Swelling in face and hands  Zone 3: EMERGENCY  Seek immediate medical care if you have any of the following:   BP reading is greater than160 (top number) or greater than 110 (bottom number)  Severe headaches not improving with Tylenol  Serious difficulty catching your breath  Any worsening symptoms from Zone 2     Call the office [phone]) or go to [facility] if:  You begin to have strong, frequent contractions  Your water breaks.  Sometimes it is a big gush of fluid, sometimes it is just a trickle that keeps getting your panties wet or running down your legs  You have vaginal bleeding.  It is normal to have a small amount of spotting if your cervix was checked.   You don't feel your baby moving like normal.  If you don't, get you something to eat and drink and lay down and focus on feeling your baby move.  You should feel at least 10 movements in 2 hours.  If you don't, you should call the office or go to [facility].      Preterm Labor and Birth Information  The normal length of a pregnancy is 39-41 weeks. Preterm labor is when labor starts before 37 completed weeks of pregnancy. What are the risk factors for preterm labor? Preterm labor is more likely to occur in women who:  Have certain infections during pregnancy such as a bladder infection, sexually transmitted infection, or infection inside the uterus (chorioamnionitis).  Have a shorter-than-normal cervix.  Have gone into preterm labor before.  Have had surgery on their cervix.  Are younger than age 47 or older than age 39.  Are African American.  Are pregnant with twins or multiple babies (multiple gestation).  Take street drugs or smoke while pregnant.  Do not gain enough weight while pregnant.  Became pregnant shortly after having been pregnant. What are the symptoms of preterm labor? Symptoms of preterm labor include:  Cramps similar to those that can happen during a menstrual period. The cramps may happen with diarrhea.  Pain in the abdomen or lower back.  Regular uterine contractions that may feel like tightening of the abdomen.  A feeling of increased pressure in the pelvis.  Increased watery or bloody mucus discharge from the vagina.  Water breaking (ruptured amniotic sac). Why is it important to recognize signs of preterm labor? It is important to recognize signs of preterm labor because babies who are born prematurely may not be fully developed. This can put them at an increased risk for:  Long-term (chronic) heart and lung problems.  Difficulty immediately after birth with regulating body systems, including blood sugar, body temperature, heart rate, and breathing rate.  Bleeding in the brain.  Cerebral palsy.  Learning difficulties.  Death. These risks are highest for babies who are born before 34 weeks of pregnancy. How is preterm labor treated? Treatment depends on the length of your pregnancy, your  condition, and the health of your baby. It may involve:  Having a stitch (suture) placed in your cervix to prevent your cervix from opening too early (cerclage).  Taking or being given medicines, such as: ? Hormone medicines. These may be given early in pregnancy to help support the pregnancy. ? Medicine to stop contractions. ? Medicines to help mature the babys lungs. These may be prescribed if the risk of delivery is high. ? Medicines to prevent your baby from developing cerebral palsy. If the labor happens before 34 weeks of pregnancy, you may need to stay in the hospital. What should I do if I think I am in preterm labor? If you think that you are going into preterm labor, call your health care provider right away. How can I prevent preterm labor in future pregnancies? To increase your chance of  having a full-term pregnancy:  Do not use any tobacco products, such as cigarettes, chewing tobacco, and e-cigarettes. If you need help quitting, ask your health care provider.  Do not use street drugs or medicines that have not been prescribed to you during your pregnancy.  Talk with your health care provider before taking any herbal supplements, even if you have been taking them regularly.  Make sure you gain a healthy amount of weight during your pregnancy.  Watch for infection. If you think that you might have an infection, get it checked right away.  Make sure to tell your health care provider if you have gone into preterm labor before. This information is not intended to replace advice given to you by your health care provider. Make sure you discuss any questions you have with your health care provider. Document Released: 09/30/2003 Document Revised: 12/21/2015 Document Reviewed: 12/01/2015 Elsevier Interactive Patient Education  2019 [employer].

## 2018-12-20 ENCOUNTER — Encounter: Payer: Medicaid Other | Admitting: Obstetrics and Gynecology

## 2018-12-22 ENCOUNTER — Inpatient Hospital Stay (HOSPITAL_COMMUNITY)
Admission: AD | Admit: 2018-12-22 | Discharge: 2018-12-22 | Disposition: A | Discharge status: Home / Self Care | Payer: Medicaid Other | Attending: Family Medicine | Admitting: Family Medicine

## 2018-12-22 ENCOUNTER — Encounter (HOSPITAL_COMMUNITY): Payer: Self-pay

## 2018-12-22 ENCOUNTER — Other Ambulatory Visit: Payer: Self-pay

## 2018-12-22 DIAGNOSIS — M549 Dorsalgia, unspecified: Secondary | ICD-10-CM | POA: Diagnosis not present

## 2018-12-22 DIAGNOSIS — Z3A35 35 weeks gestation of pregnancy: Secondary | ICD-10-CM

## 2018-12-22 DIAGNOSIS — O9989 Other specified diseases and conditions complicating pregnancy, childbirth and the puerperium: Secondary | ICD-10-CM

## 2018-12-22 DIAGNOSIS — R7303 Prediabetes: Secondary | ICD-10-CM | POA: Diagnosis not present

## 2018-12-22 DIAGNOSIS — Z91018 Allergy to other foods: Secondary | ICD-10-CM | POA: Diagnosis not present

## 2018-12-22 DIAGNOSIS — Z833 Family history of diabetes mellitus: Secondary | ICD-10-CM | POA: Insufficient documentation

## 2018-12-22 DIAGNOSIS — Z87891 Personal history of nicotine dependence: Secondary | ICD-10-CM | POA: Insufficient documentation

## 2018-12-22 DIAGNOSIS — O479 False labor, unspecified: Secondary | ICD-10-CM

## 2018-12-22 DIAGNOSIS — O99891 Other specified diseases and conditions complicating pregnancy: Secondary | ICD-10-CM

## 2018-12-22 DIAGNOSIS — O4703 False labor before 37 completed weeks of gestation, third trimester: Secondary | ICD-10-CM | POA: Diagnosis not present

## 2018-12-22 LAB — URINALYSIS, ROUTINE W REFLEX MICROSCOPIC
Bacteria, UA: NONE SEEN
Bilirubin Urine: NEGATIVE
Glucose, UA: NEGATIVE mg/dL
Hgb urine dipstick: NEGATIVE
Ketones, ur: 20 mg/dL — AB
Nitrite: NEGATIVE
Protein, ur: NEGATIVE mg/dL
Specific Gravity, Urine: 1.016 (ref 1.005–1.030)
pH: 6 (ref 5.0–8.0)

## 2018-12-22 NOTE — MAU Note (Addendum)
Pt reports back pain 7/10 since last night intermittently and pressure in lower abdomen. Reports increased discharge the past couple of days. Denies any bleeding or loss of fluid. Denies DFM.

## 2018-12-22 NOTE — MAU Provider Note (Signed)
Chief Complaint:  Back Pain and Vaginal Discharge   First Provider Initiated Contact with Patient 12/22/18 1722     HPI: Leah Kennedy is a 23 y.o. G2P0010 at [redacted]w[redacted]d who presents to maternity admissions reporting intermittent low back pain since this morning that was so severe at times that it made her cry. Worse w/ trying to get out of bad and w/ ambulation. Pt is much better sitting in bed.   Location: bilat low back (SI joint area) Quality: sharp, pressure Severity: mod-severe  Duration: 12 hours Context: [redacted] weeks gestation Timing: intermittent Modifying factors: worse when getting out of bed this morning and with ambulation  Associated signs and symptoms: Neg for leg pain, weakness or numbness or vaginal bleeding. Unsure if she having contractions. Pos for increase mucoid discharge. Denies LOF, vaginal odor or itching.   Good fetal movement.   Pregnancy Course: Uncomplicated.   Past Medical History:  Diagnosis Date  . Depression   . Diabetes mellitus without complication (HCC)    pre-diabetes   OB History  Gravida Para Term Preterm AB Living  2       1    SAB TAB Ectopic Multiple Live Births  1            # Outcome Date GA Lbr Len/2nd Weight Sex Delivery Anes PTL Lv  2 Current           1 SAB 05/2017           Past Surgical History:  Procedure Laterality Date  . TONSILLECTOMY  2017   Family History  Problem Relation Age of Onset  . Stroke Father   . Alcohol abuse Father   . Pulmonary disease Maternal Grandmother   . Drug abuse Maternal Grandmother   . Alcohol abuse Maternal Grandfather   . Heart murmur Paternal Grandmother   . Hypertension Paternal Grandmother   . Diabetes Paternal Grandfather   . Gout Paternal Grandfather   . Kidney disease Paternal Grandfather   . Glaucoma Paternal Grandfather    Social History   Tobacco Use  . Smoking status: Former Games developer  . Smokeless tobacco: Never Used  Substance Use Topics  . Alcohol use: No    Frequency: Never   . Drug use: Not Currently    Types: Marijuana    Comment: before pregnancy   Allergies  Allergen Reactions  . Carrot [Daucus Carota] Itching   Medications Prior to Admission  Medication Sig Dispense Refill Last Dose  . Blood Pressure Monitor MISC For regular home bp monitoring during pregnancy 1 each 0   . Prenatal MV & Min w/FA-DHA (PRENATAL ADULT GUMMY/DHA/FA PO) Take by mouth. Takes 2 daily   Taking    I have reviewed patient's Past Medical Hx, Surgical Hx, Family Hx, Social Hx, medications and allergies.   ROS:  Review of Systems  Constitutional: Negative for chills and fever.  Gastrointestinal: Negative for abdominal pain.  Genitourinary: Positive for vaginal discharge. Negative for dysuria, flank pain and vaginal bleeding.  Musculoskeletal: Positive for back pain. Negative for gait problem, myalgias and neck pain.  Neurological: Negative for weakness and numbness.    Physical Exam   Patient Vitals for the past 24 hrs:  BP Temp Temp src Pulse Resp SpO2  12/22/18 1735 - - - - - 99 %  12/22/18 1655 131/78 98.4 F (36.9 C) Oral 91 16 99 %   Constitutional: Well-developed, well-nourished, obese female in no acute distress.  Cardiovascular: normal rate Respiratory: normal effort GI: Abd  soft, non-tender, gravid appropriate for gestational age. Mild UC palpated.  Neurologic: Alert and oriented x 4.  GU: Neg CVAT.  Pelvic: NEFG, physiologic discharge, no blood, cervix clean. No CMT  Dilation: Closed Effacement (%): Thick Cervical Position: Posterior Exam by:: Dorathy KinsmanVirginia Trystyn Sitts, CNM  FHT:  Baseline 150 , moderate variability, accelerations present, no decelerations Contractions: irreg, mild   Labs: Results for orders placed or performed during the hospital encounter of 12/22/18 (from the past 24 hour(s))  Urinalysis, Routine w reflex microscopic     Status: Abnormal   Collection Time: 12/22/18  4:43 PM  Result Value Ref Range   Color, Urine YELLOW YELLOW   APPearance  HAZY (A) CLEAR   Specific Gravity, Urine 1.016 1.005 - 1.030   pH 6.0 5.0 - 8.0   Glucose, UA NEGATIVE NEGATIVE mg/dL   Hgb urine dipstick NEGATIVE NEGATIVE   Bilirubin Urine NEGATIVE NEGATIVE   Ketones, ur 20 (A) NEGATIVE mg/dL   Protein, ur NEGATIVE NEGATIVE mg/dL   Nitrite NEGATIVE NEGATIVE   Leukocytes,Ua SMALL (A) NEGATIVE   RBC / HPF 0-5 0 - 5 RBC/hpf   WBC, UA 0-5 0 - 5 WBC/hpf   Bacteria, UA NONE SEEN NONE SEEN   Squamous Epithelial / LPF 0-5 0 - 5   Mucus PRESENT     Imaging:  No results found.  MAU Course: Orders Placed This Encounter  Procedures  . Urinalysis, Routine w reflex microscopic  . Discharge patient   No orders of the defined types were placed in this encounter.   MDM: - Low back pain and pregnancy likely due to a combination of Braxton Hicks contractions and SI joint pain.  No evidence of active preterm labor or other emergent condition.  Discussed comfort measures, stretches, massage.  Patient desires to avoid medications.  Assessment: 1. Back pain affecting pregnancy in third trimester   2. Braxton Hicks contractions     Plan: Discharge home in stable condition.  Preterm labor precautions and fetal kick counts Follow-up Information    [facility] on 12/26/2018.   Why:  as scheduled or sooner as needed if symptoms worsen Contact information: [address] [place] [phone]       [facility] Follow up.   Specialty:  Obstetrics and Gynecology Why:  As needed in pregnancy emergencies Contact information: [address] [id number] [place] [id number] 330 [phone]          Allergies as of 12/22/2018      Reactions   Carrot [daucus Carota] Itching      Medication List    TAKE these medications   Blood Pressure Monitor Misc For regular home bp monitoring during pregnancy   PRENATAL ADULT GUMMY/DHA/FA PO Take by mouth. Takes 2 daily        ButlerSmith, [place], [place] 12/22/2018 6:00 PM

## 2018-12-22 NOTE — Discharge Instructions (Signed)
Back Pain in Pregnancy Back pain during pregnancy is common. Back pain may be caused by several factors that are related to changes during your pregnancy. Follow these instructions at home: Managing pain, stiffness, and swelling      If directed, for sudden (acute) back pain, put ice on the painful area. ? Put ice in a plastic bag. ? Place a towel between your skin and the bag. ? Leave the ice on for 20 minutes, 2-3 times per day.  If directed, apply heat to the affected area before you exercise. Use the heat source that your health care provider recommends, such as a moist heat pack or a heating pad. ? Place a towel between your skin and the heat source. ? Leave the heat on for 20-30 minutes. ? Remove the heat if your skin turns bright red. This is especially important if you are unable to feel pain, heat, or cold. You may have a greater risk of getting burned.  If directed, massage the affected area. Activity  Exercise as told by your health care provider. Gentle exercise is the best way to prevent or manage back pain.  Listen to your body when lifting. If lifting hurts, ask for help or bend your knees. This uses your leg muscles instead of your back muscles.  Squat down when picking up something from the floor. Do not bend over.  Only use bed rest for short periods as told by your health care provider. Bed rest should only be used for the most severe episodes of back pain. Standing, sitting, and lying down  Do not stand in one place for long periods of time.  Use good posture when sitting. Make sure your head rests over your shoulders and is not hanging forward. Use a pillow on your lower back if necessary.  Try sleeping on your side, preferably the left side, with a pregnancy support pillow or 1-2 regular pillows between your legs. ? If you have back pain after a night's rest, your bed may be too soft. ? A firm mattress may provide more support for your back during  pregnancy. General instructions  Do not wear high heels.  Eat a healthy diet. Try to gain weight within your health care provider's recommendations.  Use a maternity girdle, elastic sling, or back brace as told by your health care provider.  Take over-the-counter and prescription medicines only as told by your health care provider.  Work with a physical therapist or massage therapist to find ways to manage back pain. Acupuncture or massage therapy may be helpful.  Keep all follow-up visits as told by your health care provider. This is important. Contact a health care provider if:  Your back pain interferes with your daily activities.  You have increasing pain in other parts of your body. Get help right away if:  You develop numbness, tingling, weakness, or problems with the use of your arms or legs.  You develop severe back pain that is not controlled with medicine.  You have a change in bowel or bladder control.  You develop shortness of breath, dizziness, or you faint.  You develop nausea, vomiting, or sweating.  You have back pain that is a rhythmic, cramping pain similar to labor pains. Labor pain is usually 1-2 minutes apart, lasts for about 1 minute, and involves a bearing down feeling or pressure in your pelvis.  You have back pain and your water breaks or you have vaginal bleeding.  You have back pain or numbness  that travels down your leg.  Your back pain developed after you fell.  You develop pain on one side of your back.  You see blood in your urine.  You develop skin blisters in the area of your back pain. Summary  Back pain may be caused by several factors that are related to changes during your pregnancy.  Follow instructions as told by your health care provider for managing pain, stiffness, and swelling.  Exercise as told by your health care provider. Gentle exercise is the best way to prevent or manage back pain.  Take over-the-counter and  prescription medicines only as told by your health care provider.  Keep all follow-up visits as told by your health care provider. This is important. This information is not intended to replace advice given to you by your health care provider. Make sure you discuss any questions you have with your health care provider. Document Released: 10/18/2005 Document Revised: 12/26/2017 Document Reviewed: 12/26/2017 Elsevier Interactive Patient Education  2019 [employer]     Pain Relief During Labor and Delivery Many things can cause pain during labor and delivery, including:  Pressure on bones and ligaments due to the baby moving through the pelvis.  Stretching of tissues due to the baby moving through the birth canal.  Muscle tension due to anxiety or nervousness.  The uterus tightening (contracting) and relaxing to help move the baby. There are many ways to deal with the pain of labor and delivery. They include:  Taking prenatal classes. Taking these classes helps you know what to expect during your babys birth. What you learn will increase your confidence and decrease your anxiety.  Practicing relaxation techniques or doing relaxing activities, such as: ? Focused breathing. ? Meditation. ? Visualization. ? Aroma therapy. ? Listening to your favorite music. ? Hypnosis.  Taking a warm shower or bath (hydrotherapy). This may: ? Provide comfort and relaxation. ? Lessen your perception of pain. ? Decrease the amount of pain medicine needed. ? Decrease the length of labor.  Getting a massage or counterpressure on your back.  Applying warm packs or ice packs.  Changing positions often, moving around, or using a birthing ball.  Getting: ? Pain medicine through an IV or injection into a muscle. ? Pain medicine inserted into your spinal column. ? Injections of sterile water just under the skin on your lower back (intradermal injections). ? Laughing gas (nitrous oxide). Discuss  your pain control options with your health care provider during your prenatal visits. Explore the options offered by your hospital or birth center. What kinds of medicine are available? There are two kinds of medicines that can be used to relieve pain during labor and delivery:  Analgesics. These medicines decrease pain without causing you to lose feeling or the ability to move your muscles.  Anesthetics. These medicines block feeling in the body and can decrease your ability to move freely. Both of these kinds of medicine can cause minor side effects, such as nausea, trouble concentrating, and sleepiness. They can also decrease the baby's heart rate before birth and affect the babys breathing rate after birth. For this reason, health care providers are careful about when and how much medicine is given. What are specific medicines and procedures that provide pain relief? Local Anesthetics Local anesthetics are used to numb a small area of the body. They may be used along with another kind of anesthetic or used to numb the nerves of the vagina, cervix, and perineum during the second stage  of labor. General Anesthetics General anesthetics cause you to lose consciousness so you do not feel pain. They are usually only used for an emergency cesarean delivery. General anesthetics are given through an IV tube and a mask. Pudendal Block A pudendal block is a form of local anesthetic. It may be used to relieve the pain associated with pushing or stretching of the perineum at the time of delivery or to further numb the perineum. A pudendal block is done by injecting numbing medicine through the vaginal wall into a nerve in the pelvis. Epidural Analgesia Epidural analgesia is given through a flexible IV catheter that is inserted into the lower back. Numbing medicine is delivered continuously to the area near your spinal column nerves (epidural space). After having this type of analgesia, you may be able to  move your legs but you most likely will not be able to walk. Depending on the amount of medicine given, you may lose all feeling in the lower half of your body, or you may retain some level of sensation, including the urge to push. Epidural analgesia can be used to provide pain relief for a vaginal birth. Spinal Block A spinal block is similar to epidural analgesia, but the medicine is injected into the spinal fluid instead of the epidural space. A spinal block is only given once. It starts to relieve pain quickly, but the pain relief lasts only 1-6 hours. Spinal blocks can be used for cesarean deliveries. Combined Spinal-Epidural (CSE) Block A CSE block combines the effects of a spinal block and epidural analgesia. The spinal block works quickly to block all pain. The epidural analgesia provides continuous pain relief, even after the effects of the spinal block have worn off. This information is not intended to replace advice given to you by your health care provider. Make sure you discuss any questions you have with your health care provider. Document Released: 10/26/2008 Document Revised: 12/17/2015 Document Reviewed: 12/01/2015 Elsevier Interactive Patient Education  2019 [employer].

## 2018-12-25 ENCOUNTER — Encounter: Payer: Self-pay | Admitting: *Deleted

## 2018-12-26 ENCOUNTER — Ambulatory Visit (INDEPENDENT_AMBULATORY_CARE_PROVIDER_SITE_OTHER): Payer: Medicaid Other | Admitting: Advanced Practice Midwife

## 2018-12-26 ENCOUNTER — Encounter: Payer: Medicaid Other | Admitting: Advanced Practice Midwife

## 2018-12-26 ENCOUNTER — Encounter: Payer: Self-pay | Admitting: Advanced Practice Midwife

## 2018-12-26 ENCOUNTER — Other Ambulatory Visit: Payer: Self-pay

## 2018-12-26 VITALS — BP 119/81 | HR 85 | Wt 296.0 lb

## 2018-12-26 DIAGNOSIS — Z3483 Encounter for supervision of other normal pregnancy, third trimester: Secondary | ICD-10-CM

## 2018-12-26 DIAGNOSIS — A599 Trichomoniasis, unspecified: Secondary | ICD-10-CM

## 2018-12-26 DIAGNOSIS — Z3A36 36 weeks gestation of pregnancy: Secondary | ICD-10-CM

## 2018-12-26 DIAGNOSIS — O26893 Other specified pregnancy related conditions, third trimester: Secondary | ICD-10-CM

## 2018-12-26 DIAGNOSIS — Z1389 Encounter for screening for other disorder: Secondary | ICD-10-CM

## 2018-12-26 DIAGNOSIS — Z331 Pregnant state, incidental: Secondary | ICD-10-CM

## 2018-12-26 LAB — POCT URINALYSIS DIPSTICK OB
Blood, UA: NEGATIVE
Glucose, UA: NEGATIVE
Ketones, UA: NEGATIVE
Leukocytes, UA: NEGATIVE
Nitrite, UA: NEGATIVE
POC,PROTEIN,UA: NEGATIVE

## 2018-12-26 NOTE — Patient Instructions (Addendum)

## 2018-12-26 NOTE — Progress Notes (Signed)
  G2P0010 [redacted]w[redacted]d Estimated Date of Delivery: 01/21/19  Blood pressure 119/81, pulse 85, weight 296 lb (134.3 kg), last menstrual period 03/29/2018.   BP weight and urine results all reviewed and noted.  Please refer to the obstetrical flow sheet for the fundal height and fetal heart rate documentation:  Patient reports good fetal movement, denies any bleeding and no rupture of membranes symptoms or regular contractions. Patient is without complaints. All questions were answered.   Physical Assessment:   Vitals:   12/26/18 1339  BP: 119/81  Pulse: 85  Weight: 296 lb (134.3 kg)  Body mass index is 47.78 kg/m.        Physical Examination:   General appearance: Well appearing, and in no distress  Mental status: Alert, oriented to person, place, and time  Skin: Warm & dry  Cardiovascular: Normal heart rate noted  Respiratory: Normal respiratory effort, no distress  Abdomen: Soft, gravid, nontender  Pelvic: Cervical exam performed  Dilation: 2 Effacement (%): 70 Station: -2  Extremities: Edema: None  Fetal Status: Fetal Heart Rate (bpm): 140 Fundal Height: 38 cm Movement: Present Presentation: Vertex  Results for orders placed or performed in visit on 12/26/18 (from the past 24 hour(s))  POC Urinalysis Dipstick OB   Collection Time: 12/26/18  1:51 PM  Result Value Ref Range   Color, UA     Clarity, UA     Glucose, UA Negative Negative   Bilirubin, UA     Ketones, UA neg    Spec Grav, UA     Blood, UA neg    pH, UA     POC,PROTEIN,UA Negative Negative, Trace, Small (1+), Moderate (2+), Large (3+), 4+   Urobilinogen, UA     Nitrite, UA neg    Leukocytes, UA Negative Negative   Appearance     Odor       Orders Placed This Encounter  Procedures  . Strep Gp B NAA  . GC/Chlamydia Probe Amp  . Trichomonas vaginalis, RNA  . POC Urinalysis Dipstick OB    Plan:  Continued routine obstetrical care,  Return in about 1 week (around 01/02/2019) for LROB in person.

## 2018-12-28 LAB — STREP GP B NAA: Strep Gp B NAA: NEGATIVE

## 2018-12-31 ENCOUNTER — Inpatient Hospital Stay (HOSPITAL_BASED_OUTPATIENT_CLINIC_OR_DEPARTMENT_OTHER): Payer: Medicaid Other

## 2018-12-31 ENCOUNTER — Other Ambulatory Visit: Payer: Self-pay

## 2018-12-31 ENCOUNTER — Encounter (HOSPITAL_COMMUNITY): Payer: Self-pay

## 2018-12-31 ENCOUNTER — Inpatient Hospital Stay (HOSPITAL_COMMUNITY)
Admission: AD | Admit: 2018-12-31 | Discharge: 2018-12-31 | Disposition: A | Discharge status: Home / Self Care | Payer: Medicaid Other | Attending: Obstetrics and Gynecology | Admitting: Obstetrics and Gynecology

## 2018-12-31 DIAGNOSIS — R102 Pelvic and perineal pain: Secondary | ICD-10-CM | POA: Diagnosis not present

## 2018-12-31 DIAGNOSIS — Z3A37 37 weeks gestation of pregnancy: Secondary | ICD-10-CM

## 2018-12-31 DIAGNOSIS — O368190 Decreased fetal movements, unspecified trimester, not applicable or unspecified: Secondary | ICD-10-CM

## 2018-12-31 DIAGNOSIS — O368130 Decreased fetal movements, third trimester, not applicable or unspecified: Secondary | ICD-10-CM | POA: Insufficient documentation

## 2018-12-31 DIAGNOSIS — M549 Dorsalgia, unspecified: Secondary | ICD-10-CM

## 2018-12-31 DIAGNOSIS — Z3689 Encounter for other specified antenatal screening: Secondary | ICD-10-CM

## 2018-12-31 DIAGNOSIS — Z87891 Personal history of nicotine dependence: Secondary | ICD-10-CM | POA: Insufficient documentation

## 2018-12-31 DIAGNOSIS — M545 Low back pain: Secondary | ICD-10-CM | POA: Diagnosis not present

## 2018-12-31 DIAGNOSIS — O9989 Other specified diseases and conditions complicating pregnancy, childbirth and the puerperium: Secondary | ICD-10-CM

## 2018-12-31 DIAGNOSIS — O26893 Other specified pregnancy related conditions, third trimester: Secondary | ICD-10-CM

## 2018-12-31 LAB — URINALYSIS, ROUTINE W REFLEX MICROSCOPIC
Bacteria, UA: NONE SEEN
Bilirubin Urine: NEGATIVE
Glucose, UA: NEGATIVE mg/dL
Hgb urine dipstick: NEGATIVE
Ketones, ur: NEGATIVE mg/dL
Nitrite: NEGATIVE
Protein, ur: NEGATIVE mg/dL
Specific Gravity, Urine: 1.017 (ref 1.005–1.030)
pH: 6 (ref 5.0–8.0)

## 2018-12-31 MED ORDER — COMFORT FIT MATERNITY SUPP LG MISC: 1.0000 "application " | Freq: Every day | 0 refills | Status: DC

## 2018-12-31 MED ORDER — CYCLOBENZAPRINE HCL 10 MG PO TABS: 10.0000 mg | ORAL_TABLET | Freq: Three times a day (TID) | ORAL | 0 refills | Status: DC | PRN

## 2018-12-31 NOTE — MAU Provider Note (Signed)
History     CSN: [id number]  Arrival date and time: 12/31/18 0130   First Provider Initiated Contact with Patient 12/31/18 [phone]      Chief Complaint  Patient presents with  . Back Pain  . Pelvic Pain  . Decreased Fetal Movement   G2P0010 @37 .0 presenting with LBP and pelvic pain. Pain is intermittent and worse with ambulation and standing. Pain started several weeks ago and getting worse. Rates pain 7/10. Has not tried anything for it. Denies urinary sx. No VB, LOF, or ctx. Also reports decreased FM since yesterday. Last felt movement around 3pm.   OB History    Gravida  2   Para      Term      Preterm      AB  1   Living        SAB  1   TAB      Ectopic      Multiple      Live Births              Past Medical History:  Diagnosis Date  . Depression   . Diabetes mellitus without complication ([place])    pre-diabetes    Past Surgical History:  Procedure Laterality Date  . TONSILLECTOMY  2017    Family History  Problem Relation Age of Onset  . Stroke Father   . Alcohol abuse Father   . Pulmonary disease Maternal Grandmother   . Drug abuse Maternal Grandmother   . Alcohol abuse Maternal Grandfather   . Heart murmur Paternal Grandmother   . Hypertension Paternal Grandmother   . Diabetes Paternal Grandfather   . Gout Paternal Grandfather   . Kidney disease Paternal Grandfather   . Glaucoma Paternal Grandfather     Social History   Tobacco Use  . Smoking status: Former Research scientist (life sciences)  . Smokeless tobacco: Never Used  Substance Use Topics  . Alcohol use: No    Frequency: Never  . Drug use: Not Currently    Types: Marijuana    Comment: before pregnancy    Allergies:  Allergies  Allergen Reactions  . Carrot [Daucus Carota] Itching    Medications Prior to Admission  Medication Sig Dispense Refill Last Dose  . Prenatal MV & Min w/FA-DHA (PRENATAL ADULT GUMMY/DHA/FA PO) Take by mouth. Takes 2 daily   12/31/2018 at Unknown time  . Blood Pressure  Monitor MISC For regular home bp monitoring during pregnancy 1 each 0 Taking    Review of Systems  Gastrointestinal: Negative for abdominal pain.  Genitourinary: Positive for pelvic pain. Negative for dysuria, hematuria, vaginal bleeding and vaginal discharge.  Musculoskeletal: Positive for back pain.   Physical Exam   Blood pressure 122/62, pulse 88, temperature 98.3 F (36.8 C), temperature source Oral, resp. rate 16, height 5\' 7"  (1.702 m), weight 135.4 kg, last menstrual period 03/29/2018, SpO2 100 %.  Physical Exam  Nursing note and vitals reviewed. Constitutional: She is oriented to person, place, and time. She appears well-developed and well-nourished. No distress.  HENT:  Head: Normocephalic and atraumatic.  Neck: Normal range of motion.  Cardiovascular: Normal rate.  Respiratory: Effort normal. No respiratory distress.  GI: Soft. She exhibits no distension. There is no abdominal tenderness. There is no CVA tenderness.  gravid  Genitourinary:    Genitourinary Comments: SVE: 1.5/70/-1, vtx   Musculoskeletal: Normal range of motion.     Cervical back: Normal.     Thoracic back: Normal.     Lumbar back: Normal.  Neurological: She is alert and oriented to person, place, and time.  Skin: Skin is warm and dry.  Psychiatric: Her mood appears anxious.  EFM: 145 bpm, mod variability, + accels, no decels Toco: none  Results for orders placed or performed during the hospital encounter of 12/31/18 (from the past 24 hour(s))  Urinalysis, Routine w reflex microscopic     Status: Abnormal   Collection Time: 12/31/18  2:03 AM  Result Value Ref Range   Color, Urine YELLOW YELLOW   APPearance HAZY (A) CLEAR   Specific Gravity, Urine 1.017 1.005 - 1.030   pH 6.0 5.0 - 8.0   Glucose, UA NEGATIVE NEGATIVE mg/dL   Hgb urine dipstick NEGATIVE NEGATIVE   Bilirubin Urine NEGATIVE NEGATIVE   Ketones, ur NEGATIVE NEGATIVE mg/dL   Protein, ur NEGATIVE NEGATIVE mg/dL   Nitrite NEGATIVE  NEGATIVE   Leukocytes,Ua MODERATE (A) NEGATIVE   RBC / HPF 0-5 0 - 5 RBC/hpf   WBC, UA 0-5 0 - 5 WBC/hpf   Bacteria, UA NONE SEEN NONE SEEN   Squamous Epithelial / LPF 0-5 0 - 5   Mucus PRESENT    MAU Course  Procedures Orders Placed This Encounter  Procedures  . US MFM FETAL BPP WO NON STRESS    Standing Status:   Standing    Number of Occurrences:   1    Order Specific Question:   What location should the exam be performed?    Answer:   [facility] ULTRASOUND    Order Specific Question:   Symptom/Reason for Exam    Answer:   Decreased fetal movement [id number]]  . Urinalysis, Routine w reflex microscopic    Standing Status:   Standing    Number of Occurrences:   1   MDM NST reactive. BPP 8/8, normal AFI. No evidence of labor or UTI. Pain consistent with advancing pregnancy and normal discomforts. Discussed comfort measures. Stable for discharge home.  Assessment and Plan   1. [redacted] weeks gestation of pregnancy   2. NST (non-stress test) reactive   3. Decreased fetal movements in third trimester, single or unspecified fetus   4. Back pain affecting pregnancy in third trimester   5. Pelvic pain affecting pregnancy in third trimester, antepartum   6. Decreased fetal movement    [facility] Rx Flexeril Rx Maternity belt  Allergies as of 12/31/2018      Reactions   Carrot [daucus Carota] Itching      Medication List    TAKE these medications   Blood Pressure Monitor Misc For regular home bp monitoring during pregnancy   Comfort Fit Maternity Supp Lg Misc 1 application by Does not apply route daily.   cyclobenzaprine 10 MG tablet Commonly known as:  FLEXERIL Take 1 tablet (10 mg total) by mouth every 8 (eight) hours as needed for muscle spasms.   PRENATAL ADULT GUMMY/DHA/FA PO Take by mouth. Takes 2 daily      Donette LarryMelanie Devaunte Gasparini, [place] 12/31/2018, 2:30 AM

## 2018-12-31 NOTE — Discharge Instructions (Signed)
Fetal Movement Counts Patient Name: ________________________________________________ Patient Due Date: ____________________ What is a fetal movement count?  A fetal movement count is the number of times that you feel your baby move during a certain amount of time. This may also be called a fetal kick count. A fetal movement count is recommended for every pregnant woman. You may be asked to start counting fetal movements as early as week 28 of your pregnancy. Pay attention to when your baby is most active. You may notice your baby's sleep and wake cycles. You may also notice things that make your baby move more. You should do a fetal movement count:  When your baby is normally most active.  At the same time each day. A good time to count movements is while you are resting, after having something to eat and drink. How do I count fetal movements? 1. Find a quiet, comfortable area. Sit, or lie down on your side. 2. Write down the date, the start time and stop time, and the number of movements that you felt between those two times. Take this information with you to your health care visits. 3. For 2 hours, count kicks, flutters, swishes, rolls, and jabs. You should feel at least 10 movements during 2 hours. 4. You may stop counting after you have felt 10 movements. 5. If you do not feel 10 movements in 2 hours, have something to eat and drink. Then, keep resting and counting for 1 hour. If you feel at least 4 movements during that hour, you may stop counting. Contact a health care provider if:  You feel fewer than 4 movements in 2 hours.  Your baby is not moving like he or she usually does. Date: ____________ Start time: ____________ Stop time: ____________ Movements: ____________ Date: ____________ Start time: ____________ Stop time: ____________ Movements: ____________ Date: ____________ Start time: ____________ Stop time: ____________ Movements: ____________ Date: ____________ Start time:  ____________ Stop time: ____________ Movements: ____________ Date: ____________ Start time: ____________ Stop time: ____________ Movements: ____________ Date: ____________ Start time: ____________ Stop time: ____________ Movements: ____________ Date: ____________ Start time: ____________ Stop time: ____________ Movements: ____________ Date: ____________ Start time: ____________ Stop time: ____________ Movements: ____________ Date: ____________ Start time: ____________ Stop time: ____________ Movements: ____________ This information is not intended to replace advice given to you by your health care provider. Make sure you discuss any questions you have with your health care provider. Document Released: 08/09/2006 Document Revised: 03/08/2016 Document Reviewed: 08/19/2015 Elsevier Interactive Patient Education  2019 Elsevier Inc.  [employer] CorporationBraxton Hicks Contractions Contractions of the uterus can occur throughout pregnancy, but they are not always a sign that you are in labor. You may have practice contractions called Braxton Hicks contractions. These false labor contractions are sometimes confused with true labor. What are Deberah PeltonBraxton Hicks contractions? Braxton Hicks contractions are tightening movements that occur in the muscles of the uterus before labor. Unlike true labor contractions, these contractions do not result in opening (dilation) and thinning of the cervix. Toward the end of pregnancy (32-34 weeks), Braxton Hicks contractions can happen more often and may become stronger. These contractions are sometimes difficult to tell apart from true labor because they can be very uncomfortable. You should not feel embarrassed if you go to the hospital with false labor. Sometimes, the only way to tell if you are in true labor is for your health care provider to look for changes in the cervix. The health care provider will do a physical exam and may monitor your contractions.  If you are not in true labor, the exam  should show that your cervix is not dilating and your water has not broken. If there are no other health problems associated with your pregnancy, it is completely safe for you to be sent home with false labor. You may continue to have Braxton Hicks contractions until you go into true labor. How to tell the difference between true labor and false labor True labor  Contractions last 30-70 seconds.  Contractions become very regular.  Discomfort is usually felt in the top of the uterus, and it spreads to the lower abdomen and low back.  Contractions do not go away with walking.  Contractions usually become more intense and increase in frequency.  The cervix dilates and gets thinner. False labor  Contractions are usually shorter and not as strong as true labor contractions.  Contractions are usually irregular.  Contractions are often felt in the front of the lower abdomen and in the groin.  Contractions may go away when you walk around or change positions while lying down.  Contractions get weaker and are shorter-lasting as time goes on.  The cervix usually does not dilate or become thin. Follow these instructions at home:   Take over-the-counter and prescription medicines only as told by your health care provider.  Keep up with your usual exercises and follow other instructions from your health care provider.  Eat and drink lightly if you think you are going into labor.  If Braxton Hicks contractions are making you uncomfortable: ? Change your position from lying down or resting to walking, or change from walking to resting. ? Sit and rest in a tub of warm water. ? Drink enough fluid to keep your urine pale yellow. Dehydration may cause these contractions. ? Do slow and deep breathing several times an hour.  Keep all follow-up prenatal visits as told by your health care provider. This is important. Contact a health care provider if:  You have a fever.  You have continuous  pain in your abdomen. Get help right away if:  Your contractions become stronger, more regular, and closer together.  You have fluid leaking or gushing from your vagina.  You pass blood-tinged mucus (bloody show).  You have bleeding from your vagina.  You have low back pain that you never had before.  You feel your babys head pushing down and causing pelvic pressure.  Your baby is not moving inside you as much as it used to. Summary  Contractions that occur before labor are called Braxton Hicks contractions, false labor, or practice contractions.  Braxton Hicks contractions are usually shorter, weaker, farther apart, and less regular than true labor contractions. True labor contractions usually become progressively stronger and regular, and they become more frequent.  Manage discomfort from [facility] contractions by changing position, resting in a warm bath, drinking plenty of water, or practicing deep breathing. This information is not intended to replace advice given to you by your health care provider. Make sure you discuss any questions you have with your health care provider. Document Released: 11/23/2016 Document Revised: 04/24/2017 Document Reviewed: 11/23/2016 Elsevier Interactive Patient Education  2019 [employer].   Back Pain in Pregnancy Back pain during pregnancy is common. Back pain may be caused by several factors that are related to changes during your pregnancy. Follow these instructions at home: Managing pain, stiffness, and swelling      If directed, for sudden (acute) back pain, put ice on the painful area. ? Put  ice in a plastic bag. ? Place a towel between your skin and the bag. ? Leave the ice on for 20 minutes, 2-3 times per day.  If directed, apply heat to the affected area before you exercise. Use the heat source that your health care provider recommends, such as a moist heat pack or a heating pad. ? Place a towel between your skin and the  heat source. ? Leave the heat on for 20-30 minutes. ? Remove the heat if your skin turns bright red. This is especially important if you are unable to feel pain, heat, or cold. You may have a greater risk of getting burned.  If directed, massage the affected area. Activity  Exercise as told by your health care provider. Gentle exercise is the best way to prevent or manage back pain.  Listen to your body when lifting. If lifting hurts, ask for help or bend your knees. This uses your leg muscles instead of your back muscles.  Squat down when picking up something from the floor. Do not bend over.  Only use bed rest for short periods as told by your health care provider. Bed rest should only be used for the most severe episodes of back pain. Standing, sitting, and lying down  Do not stand in one place for long periods of time.  Use good posture when sitting. Make sure your head rests over your shoulders and is not hanging forward. Use a pillow on your lower back if necessary.  Try sleeping on your side, preferably the left side, with a pregnancy support pillow or 1-2 regular pillows between your legs. ? If you have back pain after a night's rest, your bed may be too soft. ? A firm mattress may provide more support for your back during pregnancy. General instructions  Do not wear high heels.  Eat a healthy diet. Try to gain weight within your health care provider's recommendations.  Use a maternity girdle, elastic sling, or back brace as told by your health care provider.  Take over-the-counter and prescription medicines only as told by your health care provider.  Work with a physical therapist or massage therapist to find ways to manage back pain. Acupuncture or massage therapy may be helpful.  Keep all follow-up visits as told by your health care provider. This is important. Contact a health care provider if:  Your back pain interferes with your daily activities.  You have  increasing pain in other parts of your body. Get help right away if:  You develop numbness, tingling, weakness, or problems with the use of your arms or legs.  You develop severe back pain that is not controlled with medicine.  You have a change in bowel or bladder control.  You develop shortness of breath, dizziness, or you faint.  You develop nausea, vomiting, or sweating.  You have back pain that is a rhythmic, cramping pain similar to labor pains. Labor pain is usually 1-2 minutes apart, lasts for about 1 minute, and involves a bearing down feeling or pressure in your pelvis.  You have back pain and your water breaks or you have vaginal bleeding.  You have back pain or numbness that travels down your leg.  Your back pain developed after you fell.  You develop pain on one side of your back.  You see blood in your urine.  You develop skin blisters in the area of your back pain. Summary  Back pain may be caused by several factors that are related  to changes during your pregnancy.  Follow instructions as told by your health care provider for managing pain, stiffness, and swelling.  Exercise as told by your health care provider. Gentle exercise is the best way to prevent or manage back pain.  Take over-the-counter and prescription medicines only as told by your health care provider.  Keep all follow-up visits as told by your health care provider. This is important. This information is not intended to replace advice given to you by your health care provider. Make sure you discuss any questions you have with your health care provider. Document Released: 10/18/2005 Document Revised: 12/26/2017 Document Reviewed: 12/26/2017 Elsevier Interactive Patient Education  2019 Elsevier Inc.    PREGNANCY SUPPORT BELT: You are not alone, Seventy-five percent of women have some sort of abdominal or back pain at some point in their pregnancy. Your baby is growing at a fast pace, which  means that your whole body is rapidly trying to adjust to the changes. As your uterus grows, your back may start feeling a bit under stress and this can result in back or abdominal pain that can go from mild, and therefore bearable, to severe pains that will not allow you to sit or lay down comfortably, When it comes to dealing with pregnancy-related pains and cramps, some pregnant women usually prefer natural remedies, which the market is filled with nowadays. For example, wearing a pregnancy support belt can help ease and lessen your discomfort and pain. WHAT ARE THE BENEFITS OF WEARING A PREGNANCY SUPPORT BELT? A pregnancy support belt provides support to the lower portion of the belly taking some of the weight of the growing uterus and distributing to the other parts of your body. It is designed make you comfortable and gives you extra support. Over the years, the pregnancy apparel market has been studying the needs and wants of pregnant women and they have come up with the most comfortable pregnancy support belts that woman could ever ask for. In fact, you will no longer have to wear a stretched-out or bulky pregnancy belt that is visible underneath your clothes and makes you feel even more uncomfortable. Nowadays, a pregnancy support belt is made of comfortable and stretchy materials that will not irritate your skin but will actually make you feel at ease and you will not even notice you are wearing it. They are easy to put on and adjust during the day and can be worn at night for additional support.  BENEFITS:  Relives Back pain  Relieves Abdominal Muscle and Leg Pain  Stabilizes the Pelvic Ring  Offers a Cushioned Abdominal Lift Pad  Relieves pressure on the Sciatic Nerve Within Minutes WHERE TO GET YOUR PREGNANCY BELT: [employer] [phone]  [address] [place], [place] [id number]

## 2018-12-31 NOTE — MAU Note (Signed)
Pt here with complaints of increasing pelvic pain and back pain. Pt reports that the pain started around 8pm last night. Pt rates her pain 7/10. Has not taken anything for pain but tried moving/exercising, but it did not help. Pt also reports decreased fetal movement. Reports she has only felt her baby move 2 times since yesterday. Pt denies contractions, LOF, or vaginal bleeding.

## 2019-01-01 ENCOUNTER — Encounter: Payer: Self-pay | Admitting: *Deleted

## 2019-01-02 ENCOUNTER — Encounter: Payer: Self-pay | Admitting: Women's Health

## 2019-01-02 ENCOUNTER — Other Ambulatory Visit: Payer: Self-pay

## 2019-01-02 ENCOUNTER — Ambulatory Visit (INDEPENDENT_AMBULATORY_CARE_PROVIDER_SITE_OTHER): Payer: Medicaid Other | Admitting: Women's Health

## 2019-01-02 VITALS — BP 135/83 | HR 98 | Wt 298.2 lb

## 2019-01-02 DIAGNOSIS — Z1389 Encounter for screening for other disorder: Secondary | ICD-10-CM

## 2019-01-02 DIAGNOSIS — Z3A37 37 weeks gestation of pregnancy: Secondary | ICD-10-CM

## 2019-01-02 DIAGNOSIS — Z3483 Encounter for supervision of other normal pregnancy, third trimester: Secondary | ICD-10-CM

## 2019-01-02 DIAGNOSIS — O98813 Other maternal infectious and parasitic diseases complicating pregnancy, third trimester: Secondary | ICD-10-CM

## 2019-01-02 DIAGNOSIS — A599 Trichomoniasis, unspecified: Secondary | ICD-10-CM

## 2019-01-02 DIAGNOSIS — Z331 Pregnant state, incidental: Secondary | ICD-10-CM

## 2019-01-02 LAB — POCT URINALYSIS DIPSTICK OB
Blood, UA: NEGATIVE
Glucose, UA: NEGATIVE
Ketones, UA: NEGATIVE
Leukocytes, UA: NEGATIVE
Nitrite, UA: NEGATIVE
POC,PROTEIN,UA: NEGATIVE

## 2019-01-02 LAB — GC/CHLAMYDIA PROBE AMP
Chlamydia trachomatis, NAA: NEGATIVE
Neisseria Gonorrhoeae by PCR: NEGATIVE

## 2019-01-02 LAB — TRICHOMONAS VAGINALIS, PROBE AMP: Trich vag by NAA: NEGATIVE

## 2019-01-02 NOTE — Patient Instructions (Signed)
Gaylyn Cheers, I greatly value your feedback.  If you receive a survey following your visit with [place] today, we appreciate you taking the time to fill it out.  Thanks, [name], CNM, [facility]  [place]!!! It is now [place] [facility] ([place], [place] [address]) Entrance located off of [address]    Call the office [phone]) or go to [facility] if:  You begin to have strong, frequent contractions  Your water breaks.  Sometimes it is a big gush of fluid, sometimes it is just a trickle that keeps getting your panties wet or running down your legs  You have vaginal bleeding.  It is normal to have a small amount of spotting if your cervix was checked.   You don't feel your baby moving like normal.  If you don't, get you something to eat and drink and lay down and focus on feeling your baby move.  You should feel at least 10 movements in 2 hours.  If you don't, you should call the office or go to [facility].    Call the office [phone]) or go to [facility] hospital for these signs of pre-eclampsia:  Severe headache that does not go away with Tylenol  Visual changes- seeing spots, double, blurred vision  Pain under your right breast or upper abdomen that does not go away with Tums or heartburn medicine  Nausea and/or vomiting  Severe swelling in your hands, feet, and face     Braxton Hicks Contractions Contractions of the uterus can occur throughout pregnancy, but they are not always a sign that you are in labor. You may have practice contractions called Braxton Hicks contractions. These false labor contractions are sometimes confused with true labor. What are [place] contractions? Braxton Hicks contractions are tightening movements that occur in the muscles of the uterus before labor. Unlike true labor contractions, these contractions do not result in opening (dilation) and thinning of the  cervix. Toward the end of pregnancy (32-34 weeks), Braxton Hicks contractions can happen more often and may become stronger. These contractions are sometimes difficult to tell apart from true labor because they can be very uncomfortable. You should not feel embarrassed if you go to the hospital with false labor. Sometimes, the only way to tell if you are in true labor is for your health care provider to look for changes in the cervix. The health care provider will do a physical exam and may monitor your contractions. If you are not in true labor, the exam should show that your cervix is not dilating and your water has not broken. If there are no other health problems associated with your pregnancy, it is completely safe for you to be sent home with false labor. You may continue to have Braxton Hicks contractions until you go into true labor. How to tell the difference between true labor and false labor True labor  Contractions last 30-70 seconds.  Contractions become very regular.  Discomfort is usually felt in the top of the uterus, and it spreads to the lower abdomen and low back.  Contractions do not go away with walking.  Contractions usually become more intense and increase in frequency.  The cervix dilates and gets thinner. False labor  Contractions are usually shorter and not as strong as true labor contractions.  Contractions are usually irregular.  Contractions are often felt in the front of the lower abdomen and in  the groin.  Contractions may go away when you walk around or change positions while lying down.  Contractions get weaker and are shorter-lasting as time goes on.  The cervix usually does not dilate or become thin. Follow these instructions at home:   Take over-the-counter and prescription medicines only as told by your health care provider.  Keep up with your usual exercises and follow other instructions from your health care provider.  Eat and drink lightly  if you think you are going into labor.  If Braxton Hicks contractions are making you uncomfortable: ? Change your position from lying down or resting to walking, or change from walking to resting. ? Sit and rest in a tub of warm water. ? Drink enough fluid to keep your urine pale yellow. Dehydration may cause these contractions. ? Do slow and deep breathing several times an hour.  Keep all follow-up prenatal visits as told by your health care provider. This is important. Contact a health care provider if:  You have a fever.  You have continuous pain in your abdomen. Get help right away if:  Your contractions become stronger, more regular, and closer together.  You have fluid leaking or gushing from your vagina.  You pass blood-tinged mucus (bloody show).  You have bleeding from your vagina.  You have low back pain that you never had before.  You feel your babys head pushing down and causing pelvic pressure.  Your baby is not moving inside you as much as it used to. Summary  Contractions that occur before labor are called Braxton Hicks contractions, false labor, or practice contractions.  Braxton Hicks contractions are usually shorter, weaker, farther apart, and less regular than true labor contractions. True labor contractions usually become progressively stronger and regular, and they become more frequent.  Manage discomfort from [facility] contractions by changing position, resting in a warm bath, drinking plenty of water, or practicing deep breathing. This information is not intended to replace advice given to you by your health care provider. Make sure you discuss any questions you have with your health care provider. Document Released: 11/23/2016 Document Revised: 04/24/2017 Document Reviewed: 11/23/2016 Elsevier Interactive Patient Education  2019 [employer]

## 2019-01-02 NOTE — Progress Notes (Signed)
   LOW-RISK PREGNANCY VISIT Patient name: Leah Kennedy MRN [id number]  Date of birth: February 03, 1996 Chief Complaint:   Routine Prenatal Visit  History of Present Illness:   Leah Kennedy is a 23 y.o. G59P0010 female at [redacted]w[redacted]d with an Estimated Date of Delivery: 01/21/19 being seen today for ongoing management of a low-risk pregnancy.  Today she reports miserable, anxious, second guesses herself about everything. Denies SI. Declines need for anxiety meds. Contractions: Not present.  .  Movement: Present. denies leaking of fluid. Review of Systems:   Pertinent items are noted in HPI Denies abnormal vaginal discharge w/ itching/odor/irritation, headaches, visual changes, shortness of breath, chest pain, abdominal pain, severe nausea/vomiting, or problems with urination or bowel movements unless otherwise stated above. Pertinent History Reviewed:  Reviewed past medical,surgical, social, obstetrical and family history.  Reviewed problem list, medications and allergies. Physical Assessment:   Vitals:   01/02/19 1353  BP: 135/83  Pulse: 98  Weight: 298 lb 3.2 oz (135.3 kg)  Body mass index is 46.7 kg/m.        Physical Examination:   General appearance: Well appearing, and in no distress  Mental status: Alert, oriented to person, place, and time  Skin: Warm & dry  Cardiovascular: Normal heart rate noted  Respiratory: Normal respiratory effort, no distress  Abdomen: Soft, gravid, nontender  Pelvic: Cervical exam performed  Dilation: 1.5 Effacement (%): 50 Station: -2  Extremities: Edema: None  Fetal Status: Fetal Heart Rate (bpm): 147 Fundal Height: 38 cm Movement: Present Presentation: Vertex  Results for orders placed or performed in visit on 01/02/19 (from the past 24 hour(s))  POC Urinalysis Dipstick OB   Collection Time: 01/02/19  1:56 PM  Result Value Ref Range   Color, UA     Clarity, UA     Glucose, UA Negative Negative   Bilirubin, UA     Ketones, UA neg    Spec Grav, UA      Blood, UA neg    pH, UA     POC,PROTEIN,UA Negative Negative, Trace, Small (1+), Moderate (2+), Large (3+), 4+   Urobilinogen, UA     Nitrite, UA neg    Leukocytes, UA Negative Negative   Appearance     Odor      Assessment & Plan:  1) Low-risk pregnancy G2P0010 at [redacted]w[redacted]d with an Estimated Date of Delivery: 01/21/19    Meds: No orders of the defined types were placed in this encounter.  Labs/procedures today: sve  Plan:  Continue routine obstetrical care   Reviewed: Term labor symptoms and general obstetric precautions including but not limited to vaginal bleeding, contractions, leaking of fluid and fetal movement were reviewed in detail with the patient.  All questions were answered  Follow-up: Return in about 1 week (around 01/09/2019) for LROB in person.  Orders Placed This Encounter  Procedures  . POC Urinalysis Dipstick OB   Roma Schanz CNM, [facility] 01/02/2019 2:39 PM

## 2019-01-03 ENCOUNTER — Telehealth: Payer: Self-pay | Admitting: Women's Health

## 2019-01-03 ENCOUNTER — Telehealth: Payer: Self-pay | Admitting: *Deleted

## 2019-01-03 NOTE — Telephone Encounter (Signed)
Patient called and stated that she had a cervical exam yesterday she had some cramping and spotting last night. This morning she passed a bloody mucus blob into the toilet. Advised patient it was likely her mucus plug and nothing she needed to do. Still feels a little crampy but not bleeding.

## 2019-01-03 NOTE — Telephone Encounter (Signed)
Please call pt she had some spotting and then a glob of blood and mucus around 4 this mourning please call pt to discuss

## 2019-01-10 ENCOUNTER — Other Ambulatory Visit: Payer: Self-pay

## 2019-01-10 ENCOUNTER — Encounter: Payer: Self-pay | Admitting: Obstetrics & Gynecology

## 2019-01-10 ENCOUNTER — Ambulatory Visit (INDEPENDENT_AMBULATORY_CARE_PROVIDER_SITE_OTHER): Payer: Medicaid Other | Admitting: Obstetrics & Gynecology

## 2019-01-10 VITALS — BP 130/86 | HR 118 | Wt 303.0 lb

## 2019-01-10 DIAGNOSIS — O0993 Supervision of high risk pregnancy, unspecified, third trimester: Secondary | ICD-10-CM

## 2019-01-10 DIAGNOSIS — Z331 Pregnant state, incidental: Secondary | ICD-10-CM

## 2019-01-10 DIAGNOSIS — Z1389 Encounter for screening for other disorder: Secondary | ICD-10-CM

## 2019-01-10 DIAGNOSIS — O133 Gestational [pregnancy-induced] hypertension without significant proteinuria, third trimester: Secondary | ICD-10-CM | POA: Diagnosis not present

## 2019-01-10 DIAGNOSIS — Z3A38 38 weeks gestation of pregnancy: Secondary | ICD-10-CM

## 2019-01-10 LAB — POCT URINALYSIS DIPSTICK OB
Blood, UA: NEGATIVE
Glucose, UA: NEGATIVE
Ketones, UA: NEGATIVE
Leukocytes, UA: NEGATIVE
Nitrite, UA: NEGATIVE

## 2019-01-10 NOTE — Progress Notes (Signed)
HIGH-RISK PREGNANCY VISIT Patient name: Leah Kennedy MRN [id number]  Date of birth: Aug 23, 1995 Chief Complaint:   Routine Prenatal Visit  History of Present Illness:   Leah Kennedy is a 23 y.o. G62P0010 female at 32w3dwith an Estimated Date of Delivery: 01/21/19 being seen today for ongoing management of a high-risk pregnancy complicated by gestational HTN. borderline Today she reports no complaints. Contractions: Irregular. Vag. Bleeding: None.  Movement: Present. denies leaking of fluid.  Review of Systems:   Pertinent items are noted in HPI Denies abnormal vaginal discharge w/ itching/odor/irritation, headaches, visual changes, shortness of breath, chest pain, abdominal pain, severe nausea/vomiting, or problems with urination or bowel movements unless otherwise stated above. Pertinent History Reviewed:  Reviewed past medical,surgical, social, obstetrical and family history.  Reviewed problem list, medications and allergies. Physical Assessment:   Vitals:   01/10/19 0905 01/10/19 0907  BP: (!) 141/91 130/86  Pulse: (!) 118   Weight: (!) 303 lb (137.4 kg)   Body mass index is 47.46 kg/m.           Physical Examination:   General appearance: alert, well appearing, and in no distress  Mental status: alert, oriented to person, place, and time  Skin: warm & dry   Extremities: Edema: None    Cardiovascular: normal heart rate noted  Respiratory: normal respiratory effort, no distress  Abdomen: gravid, soft, non-tender  Pelvic: Cervical exam deferred         Fetal Status:     Movement: Present    Fetal Surveillance Testing today: FHR 152   Results for orders placed or performed in visit on 01/10/19 (from the past 24 hour(s))  POC Urinalysis Dipstick OB   Collection Time: 01/10/19  9:13 AM  Result Value Ref Range   Color, UA     Clarity, UA     Glucose, UA Negative Negative   Bilirubin, UA     Ketones, UA neg    Spec Grav, UA     Blood, UA neg    pH, UA     POC,PROTEIN,UA Small (1+) Negative, Trace, Small (1+), Moderate (2+), Large (3+), 4+   Urobilinogen, UA     Nitrite, UA neg    Leukocytes, UA Negative Negative   Appearance     Odor      Assessment & Plan:  1) High-risk pregnancy G2P0010 at 332w3dith an Estimated Date of Delivery: 01/21/19   2) Gestational Hypertension, borderline, with labs pedning, stable, short interval follow up with labs done today    Meds: No orders of the defined types were placed in this encounter.   Labs/procedures today:  Orders Placed This Encounter  Procedures  . Protein / creatinine ratio, urine  . CBC  . Comp Met (CMET)  . POC Urinalysis Dipstick OB    Treatment Plan:  Short interval follow up, labs today and will follow those up over the weekend, if abnormal proceed with induction.  Appt Tuesday for NST + checking BP, pt understands  Reviewed: Term labor symptoms and general obstetric precautions including but not limited to vaginal bleeding, contractions, leaking of fluid and fetal movement were reviewed in detail with the patient.  All questions were answered.  Follow-up: Return in about 4 days (around 01/14/2019) for NST, HROB.  Orders Placed This Encounter  Procedures  . Protein / creatinine ratio, urine  . CBC  . Comp Met (CMET)  . POC Urinalysis Dipstick OB   LuFlorian Buff6/19/2020 9:27 AM

## 2019-01-11 LAB — COMPREHENSIVE METABOLIC PANEL
ALT: 9 IU/L (ref 0–32)
AST: 17 IU/L (ref 0–40)
Albumin/Globulin Ratio: 1.3 (ref 1.2–2.2)
Albumin: 3.5 g/dL — ABNORMAL LOW (ref 3.9–5.0)
Alkaline Phosphatase: 116 IU/L (ref 39–117)
BUN/Creatinine Ratio: 9 (ref 9–23)
BUN: 6 mg/dL (ref 6–20)
Bilirubin Total: <0.2 mg/dL (ref 0.0–1.2)
CO2: 19 mmol/L — ABNORMAL LOW (ref 20–29)
Calcium: 8.7 mg/dL (ref 8.7–10.2)
Chloride: 107 mmol/L — ABNORMAL HIGH (ref 96–106)
Creatinine, Ser: 0.67 mg/dL (ref 0.57–1.00)
GFR calc Af Amer: 146 mL/min/{1.73_m2} (ref >59)
GFR calc non Af Amer: 127 mL/min/{1.73_m2} (ref >59)
Globulin, Total: 2.6 g/dL (ref 1.5–4.5)
Glucose: 99 mg/dL (ref 65–99)
Potassium: 4 mmol/L (ref 3.5–5.2)
Sodium: 141 mmol/L (ref 134–144)
Total Protein: 6.1 g/dL (ref 6.0–8.5)

## 2019-01-11 LAB — CBC
Hematocrit: 33.4 % — ABNORMAL LOW (ref 34.0–46.6)
Hemoglobin: 11.2 g/dL (ref 11.1–15.9)
MCH: 27.9 pg (ref 26.6–33.0)
MCHC: 33.5 g/dL (ref 31.5–35.7)
MCV: 83 fL (ref 79–97)
Platelets: 318 10*3/uL (ref 150–450)
RBC: 4.02 x10E6/uL (ref 3.77–5.28)
RDW: 14 % (ref 11.7–15.4)
WBC: 11.3 10*3/uL — ABNORMAL HIGH (ref 3.4–10.8)

## 2019-01-11 LAB — PROTEIN / CREATININE RATIO, URINE
Creatinine, Urine: 150.9 mg/dL
Protein, Ur: 21.2 mg/dL
Protein/Creat Ratio: 140 mg/g creat (ref 0–200)

## 2019-01-14 ENCOUNTER — Encounter: Payer: Self-pay | Admitting: Women's Health

## 2019-01-14 ENCOUNTER — Other Ambulatory Visit: Payer: Self-pay

## 2019-01-14 ENCOUNTER — Ambulatory Visit (INDEPENDENT_AMBULATORY_CARE_PROVIDER_SITE_OTHER): Payer: Medicaid Other | Admitting: Obstetrics and Gynecology

## 2019-01-14 VITALS — BP 108/80 | HR 105 | Wt 303.0 lb

## 2019-01-14 DIAGNOSIS — O99210 Obesity complicating pregnancy, unspecified trimester: Secondary | ICD-10-CM

## 2019-01-14 DIAGNOSIS — Z3A39 39 weeks gestation of pregnancy: Secondary | ICD-10-CM

## 2019-01-14 DIAGNOSIS — Z1389 Encounter for screening for other disorder: Secondary | ICD-10-CM

## 2019-01-14 DIAGNOSIS — O133 Gestational [pregnancy-induced] hypertension without significant proteinuria, third trimester: Secondary | ICD-10-CM | POA: Diagnosis not present

## 2019-01-14 DIAGNOSIS — Z331 Pregnant state, incidental: Secondary | ICD-10-CM

## 2019-01-14 DIAGNOSIS — Z3483 Encounter for supervision of other normal pregnancy, third trimester: Secondary | ICD-10-CM

## 2019-01-14 LAB — POCT URINALYSIS DIPSTICK OB
Blood, UA: NEGATIVE
Glucose, UA: NEGATIVE
Ketones, UA: NEGATIVE
Leukocytes, UA: NEGATIVE
Nitrite, UA: NEGATIVE

## 2019-01-14 NOTE — Progress Notes (Signed)
[name], female   DOB: 1996/03/26, 23 y.o.   MRN: [id number]    [facility] Patient name: Leah Kennedy MRN [id number]  Date of birth: 1996/03/26 Chief Complaint:   Routine Prenatal Visit (NST)  History of Present Illness:   Leah Kennedy is a 23 y.o. 302P0010 female at 2488w0d with an Estimated Date of Delivery: 01/21/19 being seen today for ongoing management of a high-risk pregnancy complicated by gestational HTN (borderline, single episode of high BP) now returned to normal Today she reports no complaints. No headache, scotoma, malaise.   Contractions: Irregular. Vag. Bleeding: None.  Movement: Present. denies leaking of fluid.  Review of Systems:   Pertinent items are noted in HPI Denies abnormal vaginal discharge w/ itching/odor/irritation, headaches, visual changes, shortness of breath, chest pain, abdominal pain, severe nausea/vomiting, or problems with urination or bowel movements unless otherwise stated above. Pertinent History Reviewed:  Reviewed past medical,surgical, social, obstetrical and family history.  Reviewed problem list, medications and allergies. Physical Assessment:   Vitals:   01/14/19 1512  BP: 108/80  Pulse: (!) 105  Weight: (!) 303 lb (137.4 kg)  Body mass index is 47.46 kg/m.           Physical Examination:   General appearance: alert, well appearing, and in no distress and oriented to person, place, and time  Mental status: alert, oriented to person, place, and time, normal mood, behavior, speech, dress, motor activity, and thought processes, affect appropriate to mood  Skin: warm & dry   Extremities: Edema: None    Cardiovascular: normal heart rate noted  Respiratory: normal respiratory effort, no distress  Abdomen: gravid, soft, non-tender  Pelvic: Cervical exam deferred         Fetal Status:     Movement: Present    Fetal Surveillance Testing today: NST  Results for orders placed or performed in visit on 01/14/19  (from the past 24 hour(s))  POC Urinalysis Dipstick OB   Collection Time: 01/14/19  3:14 PM  Result Value Ref Range   Color, UA     Clarity, UA     Glucose, UA Negative Negative   Bilirubin, UA     Ketones, UA neg    Spec Grav, UA     Blood, UA neg    pH, UA     POC,PROTEIN,UA Trace Negative, Trace, Small (1+), Moderate (2+), Large (3+), 4+   Urobilinogen, UA     Nitrite, UA neg    Leukocytes, UA Negative Negative   Appearance     Odor      Assessment & Plan:  1) High-risk pregnancy G2P0010 at 6088w0d with an Estimated Date of Delivery: 01/21/19   2) Borderline gestational HTN, her Bps were normal today. Single episode of high BP, now normal.  Meds: No orders of the defined types were placed in this encounter.  Labs/procedures today: none  Treatment Plan: Twice weekly NST (Tues/Fri) cervix check next week . Will strongly consider IOL for any recurrence of BP elevations.  Reviewed: Term labor symptoms and general obstetric precautions including but not limited to vaginal bleeding, contractions, leaking of fluid and fetal movement were reviewed in detail with the patient.  All questions were answered.  Follow-up: No follow-ups on file.  Orders Placed This Encounter  Procedures  . POC Urinalysis Dipstick OB   By signing my name below, I, Pietro CassisEmily Tufford, attest that this documentation has been prepared under the direction and in the presence of Tilda BurrowFerguson, Sharilyn Geisinger V, MD. Electronically  Signed: De Burrs, Medical Scribe. 01/14/19. 4:26 PM.  I personally performed the services described in this documentation, which was SCRIBED in my presence. The recorded information has been reviewed and considered accurate. It has been edited as necessary during review. Jonnie Kind, MD

## 2019-01-15 ENCOUNTER — Telehealth: Payer: Self-pay | Admitting: *Deleted

## 2019-01-15 NOTE — Telephone Encounter (Signed)
Patient called stating she has been having contractions for about 20 minutes and is very uncomfortable to the point she is crying.  Has not noticed any bleeding or leaking. She is contracting about every 8 minutes.  Has tried taking a warm shower and walking but with no relief.  Advised labor is cervical change and will need to have contractions longer than 20 minutes to have cervical change as this is her first baby.  Advised to continue walking and call back after 1:30 if she is still contracting and wants to be evaluated.  Verbalized understanding.

## 2019-01-17 ENCOUNTER — Other Ambulatory Visit: Payer: Self-pay

## 2019-01-17 ENCOUNTER — Other Ambulatory Visit: Payer: Medicaid Other | Admitting: Women's Health

## 2019-01-17 ENCOUNTER — Inpatient Hospital Stay (HOSPITAL_COMMUNITY)
Admission: AD | Admit: 2019-01-17 | Discharge: 2019-01-19 | DRG: 807 | Disposition: A | Discharge status: Home / Self Care | Payer: Medicaid Other | Attending: Obstetrics & Gynecology | Admitting: Obstetrics & Gynecology

## 2019-01-17 ENCOUNTER — Inpatient Hospital Stay (HOSPITAL_COMMUNITY): Payer: Medicaid Other | Admitting: Anesthesiology

## 2019-01-17 ENCOUNTER — Encounter (HOSPITAL_COMMUNITY): Payer: Self-pay | Admitting: *Deleted

## 2019-01-17 DIAGNOSIS — Z3A39 39 weeks gestation of pregnancy: Secondary | ICD-10-CM | POA: Diagnosis not present

## 2019-01-17 DIAGNOSIS — A599 Trichomoniasis, unspecified: Secondary | ICD-10-CM | POA: Diagnosis present

## 2019-01-17 DIAGNOSIS — O99210 Obesity complicating pregnancy, unspecified trimester: Secondary | ICD-10-CM

## 2019-01-17 DIAGNOSIS — O2492 Unspecified diabetes mellitus in childbirth: Secondary | ICD-10-CM | POA: Diagnosis not present

## 2019-01-17 DIAGNOSIS — Z87891 Personal history of nicotine dependence: Secondary | ICD-10-CM | POA: Diagnosis not present

## 2019-01-17 DIAGNOSIS — O26893 Other specified pregnancy related conditions, third trimester: Secondary | ICD-10-CM | POA: Diagnosis present

## 2019-01-17 DIAGNOSIS — O99214 Obesity complicating childbirth: Secondary | ICD-10-CM | POA: Diagnosis present

## 2019-01-17 DIAGNOSIS — Z3483 Encounter for supervision of other normal pregnancy, third trimester: Secondary | ICD-10-CM

## 2019-01-17 DIAGNOSIS — Z1159 Encounter for screening for other viral diseases: Secondary | ICD-10-CM | POA: Diagnosis not present

## 2019-01-17 LAB — TYPE AND SCREEN
ABO/RH(D): AB POS
Antibody Screen: NEGATIVE

## 2019-01-17 LAB — RPR: RPR Ser Ql: NONREACTIVE

## 2019-01-17 LAB — CBC
HCT: 34.9 % — ABNORMAL LOW (ref 36.0–46.0)
Hemoglobin: 11.4 g/dL — ABNORMAL LOW (ref 12.0–15.0)
MCH: 27.3 pg (ref 26.0–34.0)
MCHC: 32.7 g/dL (ref 30.0–36.0)
MCV: 83.5 fL (ref 80.0–100.0)
Platelets: 304 10*3/uL (ref 150–400)
RBC: 4.18 MIL/uL (ref 3.87–5.11)
RDW: 14.6 % (ref 11.5–15.5)
WBC: 10.7 10*3/uL — ABNORMAL HIGH (ref 4.0–10.5)
nRBC: 0 % (ref 0.0–0.2)

## 2019-01-17 LAB — ABO/RH: ABO/RH(D): AB POS

## 2019-01-17 LAB — POCT FERN TEST: POCT Fern Test: POSITIVE — AB

## 2019-01-17 LAB — SARS CORONAVIRUS 2 BY RT PCR (HOSPITAL ORDER, PERFORMED IN ~~LOC~~ HOSPITAL LAB): SARS Coronavirus 2: NEGATIVE

## 2019-01-17 MED ORDER — FENTANYL-BUPIVACAINE-NACL 0.5-0.125-0.9 MG/250ML-% EP SOLN
EPIDURAL | Status: AC
  Filled 2019-01-17: qty 250

## 2019-01-17 MED ORDER — DIBUCAINE (PERIANAL) 1 % EX OINT: 1.0000 "application " | TOPICAL_OINTMENT | CUTANEOUS | Status: DC | PRN

## 2019-01-17 MED ORDER — FENTANYL-BUPIVACAINE-NACL 0.5-0.125-0.9 MG/250ML-% EP SOLN: 12.0000 mL/h | EPIDURAL | Status: DC | PRN

## 2019-01-17 MED ORDER — PHENYLEPHRINE 40 MCG/ML (10ML) SYRINGE FOR IV PUSH (FOR BLOOD PRESSURE SUPPORT): 80.0000 ug | PREFILLED_SYRINGE | INTRAVENOUS | Status: DC | PRN

## 2019-01-17 MED ORDER — OXYCODONE-ACETAMINOPHEN 5-325 MG PO TABS: 1.0000 | ORAL_TABLET | ORAL | Status: DC | PRN

## 2019-01-17 MED ORDER — BENZOCAINE-MENTHOL 20-0.5 % EX AERO
1.0000 "application " | INHALATION_SPRAY | CUTANEOUS | Status: DC | PRN
  Administered 2019-01-18: 1 via TOPICAL
  Filled 2019-01-17 (×2): qty 56

## 2019-01-17 MED ORDER — ONDANSETRON HCL 4 MG PO TABS: 4.0000 mg | ORAL_TABLET | ORAL | Status: DC | PRN

## 2019-01-17 MED ORDER — LIDOCAINE HCL (PF) 1 % IJ SOLN
30.0000 mL | INTRAMUSCULAR | Status: AC | PRN
  Administered 2019-01-17: 30 mL via SUBCUTANEOUS
  Filled 2019-01-17: qty 30

## 2019-01-17 MED ORDER — ACETAMINOPHEN 325 MG PO TABS
650.0000 mg | ORAL_TABLET | ORAL | Status: DC | PRN
  Administered 2019-01-18: 650 mg via ORAL
  Filled 2019-01-17: qty 2

## 2019-01-17 MED ORDER — LACTATED RINGERS IV SOLN: 500.0000 mL | INTRAVENOUS | Status: DC | PRN

## 2019-01-17 MED ORDER — PRENATAL MULTIVITAMIN CH
1.0000 | ORAL_TABLET | Freq: Every day | ORAL | Status: DC
  Administered 2019-01-18 – 2019-01-19 (×2): 1 via ORAL
  Filled 2019-01-17 (×2): qty 1

## 2019-01-17 MED ORDER — IBUPROFEN 600 MG PO TABS
600.0000 mg | ORAL_TABLET | Freq: Four times a day (QID) | ORAL | Status: DC
  Administered 2019-01-17 – 2019-01-19 (×8): 600 mg via ORAL
  Filled 2019-01-17 (×7): qty 1

## 2019-01-17 MED ORDER — EPHEDRINE 5 MG/ML INJ: 10.0000 mg | INTRAVENOUS | Status: DC | PRN

## 2019-01-17 MED ORDER — DIPHENHYDRAMINE HCL 25 MG PO CAPS: 25.0000 mg | ORAL_CAPSULE | Freq: Four times a day (QID) | ORAL | Status: DC | PRN

## 2019-01-17 MED ORDER — FERROUS SULFATE 325 (65 FE) MG PO TABS
325.0000 mg | ORAL_TABLET | Freq: Two times a day (BID) | ORAL | Status: DC
  Administered 2019-01-18 – 2019-01-19 (×3): 325 mg via ORAL
  Filled 2019-01-17 (×3): qty 1

## 2019-01-17 MED ORDER — LIDOCAINE HCL (PF) 1 % IJ SOLN
INTRAMUSCULAR | Status: DC | PRN
  Administered 2019-01-17 (×2): 5 mL via EPIDURAL

## 2019-01-17 MED ORDER — SIMETHICONE 80 MG PO CHEW: 80.0000 mg | CHEWABLE_TABLET | ORAL | Status: DC | PRN

## 2019-01-17 MED ORDER — OXYTOCIN BOLUS FROM INFUSION
500.0000 mL | Freq: Once | INTRAVENOUS | Status: AC
  Administered 2019-01-17: 500 mL via INTRAVENOUS

## 2019-01-17 MED ORDER — FLEET ENEMA 7-19 GM/118ML RE ENEM: 1.0000 | ENEMA | RECTAL | Status: DC | PRN

## 2019-01-17 MED ORDER — SODIUM CHLORIDE (PF) 0.9 % IJ SOLN
INTRAMUSCULAR | Status: DC | PRN
  Administered 2019-01-17: 12 mL/h via EPIDURAL

## 2019-01-17 MED ORDER — SOD CITRATE-CITRIC ACID 500-334 MG/5ML PO SOLN: 30.0000 mL | ORAL | Status: DC | PRN

## 2019-01-17 MED ORDER — COCONUT OIL OIL
1.0000 "application " | TOPICAL_OIL | Status: DC | PRN
  Administered 2019-01-18: 1 via TOPICAL

## 2019-01-17 MED ORDER — DIPHENHYDRAMINE HCL 50 MG/ML IJ SOLN: 12.5000 mg | INTRAMUSCULAR | Status: DC | PRN

## 2019-01-17 MED ORDER — MAGNESIUM HYDROXIDE 400 MG/5ML PO SUSP: 30.0000 mL | ORAL | Status: DC | PRN

## 2019-01-17 MED ORDER — LACTATED RINGERS IV SOLN
500.0000 mL | Freq: Once | INTRAVENOUS | Status: AC
  Administered 2019-01-17: 500 mL via INTRAVENOUS

## 2019-01-17 MED ORDER — OXYCODONE-ACETAMINOPHEN 5-325 MG PO TABS: 2.0000 | ORAL_TABLET | ORAL | Status: DC | PRN

## 2019-01-17 MED ORDER — ONDANSETRON HCL 4 MG/2ML IJ SOLN
4.0000 mg | Freq: Four times a day (QID) | INTRAMUSCULAR | Status: DC | PRN
  Administered 2019-01-17: 4 mg via INTRAVENOUS
  Filled 2019-01-17: qty 2

## 2019-01-17 MED ORDER — IBUPROFEN 600 MG PO TABS: 600.0000 mg | ORAL_TABLET | Freq: Once | ORAL | Status: DC

## 2019-01-17 MED ORDER — LACTATED RINGERS IV SOLN
INTRAVENOUS | Status: DC
  Administered 2019-01-17 (×2): via INTRAVENOUS

## 2019-01-17 MED ORDER — OXYTOCIN 40 UNITS IN NORMAL SALINE INFUSION - SIMPLE MED
2.5000 [IU]/h | INTRAVENOUS | Status: DC
  Filled 2019-01-17: qty 1000

## 2019-01-17 MED ORDER — ACETAMINOPHEN 325 MG PO TABS: 650.0000 mg | ORAL_TABLET | ORAL | Status: DC | PRN

## 2019-01-17 MED ORDER — WITCH HAZEL-GLYCERIN EX PADS: 1.0000 "application " | MEDICATED_PAD | CUTANEOUS | Status: DC | PRN

## 2019-01-17 MED ORDER — ONDANSETRON HCL 4 MG/2ML IJ SOLN: 4.0000 mg | INTRAMUSCULAR | Status: DC | PRN

## 2019-01-17 NOTE — MAU Note (Signed)
Large gush of fluid at 0400-clear fluid.  Having some pink discharge.  No frank bleeding.  + FM.

## 2019-01-17 NOTE — H&P (Signed)
Obstetric Admission History & Physical  Subjective Leah Kennedy is a 23 y.o. female G2P0010 with IUP at 897w3d by ultrasound at 7 wks admitted for rupture of membranes.  Reports fetal movement. Denies vaginal bleeding. [facility]  Support person in labor: Reyes   [facility]  LAB RESULTS  Language English Pap <21  Initiated care at 10wk GC/CT Initial:  -/+   POC -/-       36wks:-/-  Dating by 7wk U/S    Support person [employer] NT/IT:   neg        [place]/HgbE   Flu vaccine 07/01/18 CF neg  TDaP vaccine 10/25/18 SMA   Rhogam       Blood Type AB/Positive/-- (12/09 1545)  Anatomy US Normal female Antibody Negative (12/09 1545)  Feeding Plan both HBsAg Negative (12/09 1545)  Contraception depo RPR Non Reactive (12/09 1545)  Circumcision Yes, at [facility] Rubella  1.00 (12/09 1545)  Pediatrician List given HIV Non Reactive (12/09 1545)  Prenatal Classes Info given      GTT/A1C Early:  5.3    26-28wks:71/135/98  BTL Consent n/a GBS    neg  VBAC Consent n/a    Waterbirth [ ] Class [ ] Consent [ ] CNM visit PP Needs      Ultrasounds . 7+3 Viability  . 18+6 Anatomy complete  Prenatal History/Complications: . Obesity BMI 47.46\ . Trichomonas w/ neg POC  Past Medical History:  Diagnosis Date  . Depression   . Diabetes mellitus without complication (HCC)    pre-diabetes   Past Surgical History:  Procedure Laterality Date  . TONSILLECTOMY  2017   OB History    Gravida  2   Para      Term      Preterm      AB  1   Living        SAB  1   TAB      Ectopic      Multiple      Live Births             Social History   Socioeconomic History  . Marital status: Significant Other    Spouse name: Latanya MaudlinReyes Chairez  . Number of children: 0  . Years of education: 6212  . Highest education level: GED or equivalent  Occupational History  . Not on file  Social Needs  . Financial resource strain: Not hard at all  . Food insecurity    Worry:  Never true    Inability: Never true  . Transportation needs    Medical: No    Non-medical: No  Tobacco Use  . Smoking status: Former Games developermoker  . Smokeless tobacco: Never Used  Substance and Sexual Activity  . Alcohol use: No    Frequency: Never  . Drug use: Not Currently    Types: Marijuana    Comment: before pregnancy  . Sexual activity: Not Currently    Birth control/protection: None  Lifestyle  . Physical activity    Days per week: 4 days    Minutes per session: 40 min  . Stress: To some extent  Relationships  . Social connections    Talks on phone: More than three times a week    Gets together: Three times a week    Attends religious service: Never    Active member of club or organization: No    Attends meetings of clubs or organizations: Never    Relationship status: Living with  partner  Other Topics Concern  . Not on file  Social History Narrative  . Not on file   Family History  Problem Relation Age of Onset  . Stroke Father   . Alcohol abuse Father   . Pulmonary disease Maternal Grandmother   . Drug abuse Maternal Grandmother   . Alcohol abuse Maternal Grandfather   . Heart murmur Paternal Grandmother   . Hypertension Paternal Grandmother   . Diabetes Paternal Grandfather   . Gout Paternal Grandfather   . Kidney disease Paternal Grandfather   . Glaucoma Paternal Grandfather    Allergies: Allergies  Allergen Reactions  . Carrot [Daucus Carota] Itching   Medications:  No outpatient medications have been marked as taking for the 01/17/19 encounter [facility](Hospital Encounter).   Review of Systems  All systems reviewed and negative except as stated in HPI  Objective Physical Exam:  BP 137/88   Pulse 74   Temp 98.5 F (36.9 C) (Oral)   Resp 20   Ht 5\' 7"  (1.702 m)   Wt (!) 137.4 kg   LMP 03/29/2018   BMI 47.46 kg/m  General appearance: alert, cooperative, appears stated age and mild distress Lungs: no respiratory distress Heart: RRR. No BLEE.   Abdomen: soft, non-tender; gravid  Extremities: Moving spontaneously, warm, well perfused. 2+ DP.  Uterine activity: irregular Cervical Exam:  Dilation: 2.5 Effacement (%): 70 Station: -2 Exam by:: Latricia HeftAnna Cioce, RN Presentation: cephalic Fetal monitoring: baseline 140 / Mod variability/ positive 15 x 15 a / no decels  Prenatal labs: ABO, Rh: AB/Positive/-- (12/09 1545) Antibody: Negative (04/03 0900) Rubella: 1.00 (12/09 1545) RPR: Non Reactive (04/03 0900)  HBsAg: Negative (12/09 1545)  HIV: Non Reactive (04/03 0900)  GBS: Negative (06/04 1600)  Glucola: early A1c 5.3 Genetic screening:  neg  Prenatal Transfer Tool  Maternal Diabetes: No Genetic Screening: Normal Maternal Ultrasounds/Referrals: Normal Fetal Ultrasounds or other Referrals:  None Maternal Substance Abuse:  No Significant Maternal Medications:  None Significant Maternal Lab Results: Group B Strep negative  Results for orders placed or performed during the hospital encounter of 01/17/19 (from the past 24 hour(s))  Fern Test   Collection Time: 01/17/19  6:46 AM  Result Value Ref Range   POCT Fern Test Positive = ruptured amniotic membanes (A)     Patient Active Problem List   Diagnosis Date Noted  . Trichomoniasis 10/08/2018  . Obesity affecting pregnancy, antepartum 08/08/2018  . Morbid obesity (HCC) 08/08/2018  . Chlamydia 07/15/2018  . Supervision of normal pregnancy 07/01/2018    Assessment & Plan:  Leah Kennedy is a 23 y.o. G2P0010 at 3382w3d - SROM at 0400, Fern +  Labor: Expectant management  . Pain control: Epidural . Anticipated MOD: NSVD . PPH risk: moderate  Fetal Wellbeing: Cephalic by suture.  Category 1 tracing. . GBS: Negative (06/04 1600)  . Continuous fetal monitoring  Postpartum Planning . Boy (yes @ [facility]) /both/ depo  . Rubella immune, [facility]  . Continue ASA . Nutrition consult  Genia Hotterachel Kim, M.D.  Family Medicine  PGY-1 01/17/2019 7:41 AM   I  personally was present during the history, physical exam and medical decision-making activities of this service and have verified that the service and findings are accurately documented in the student's note.   Patient moaning through contractions, now 4cm and requesting epidural. Discussed nap following epidural then position changes using peanut ball. Assess for Pitocin augmentation if no cervical change with future checks. Anticipate NSVD  Clayton BiblesSamantha Josey Dettmann Certified  Nurse Midwife Faculty Practice 01/17/19 10:38 AM

## 2019-01-17 NOTE — Anesthesia Preprocedure Evaluation (Signed)
Anesthesia Evaluation  Patient identified by MRN, date of birth, ID band Patient awake    Reviewed: Allergy & Precautions, H&P , NPO status , Patient's Chart, lab work & pertinent test results  History of Anesthesia Complications Negative for: history of anesthetic complications  Airway Mallampati: II  TM Distance: >3 FB Neck ROM: full    Dental no notable dental hx.    Pulmonary neg pulmonary ROS, former smoker,    Pulmonary exam normal        Cardiovascular negative cardio ROS Normal cardiovascular exam Rhythm:regular Rate:Normal     Neuro/Psych negative neurological ROS  negative psych ROS   GI/Hepatic negative GI ROS, Neg liver ROS,   Endo/Other  diabetesMorbid obesity  Renal/GU negative Renal ROS  negative genitourinary   Musculoskeletal   Abdominal   Peds  Hematology negative hematology ROS (+)   Anesthesia Other Findings   Reproductive/Obstetrics (+) Pregnancy                             Anesthesia Physical Anesthesia Plan  ASA: III  Anesthesia Plan: Epidural   Post-op Pain Management:    Induction:   PONV Risk Score and Plan:   Airway Management Planned:   Additional Equipment:   Intra-op Plan:   Post-operative Plan:   Informed Consent: I have reviewed the patients History and Physical, chart, labs and discussed the procedure including the risks, benefits and alternatives for the proposed anesthesia with the patient or authorized representative who has indicated his/her understanding and acceptance.       Plan Discussed with:   Anesthesia Plan Comments:         Anesthesia Quick Evaluation

## 2019-01-17 NOTE — Discharge Summary (Signed)
Obstetrics Discharge Summary OB/GYN Faculty Practice   Patient Name: Leah Kennedy DOB: 22-Aug-1995 MRN: [id number]  Date of admission: 01/17/2019 Delivering MD: Calvert CantorWEINHOLD, SAMANTHA C   Date of discharge: 01/19/2019  Admitting diagnosis: 39 WKS, LEAKING FLUIDS Intrauterine pregnancy: 4132w3d     Secondary diagnosis:   Principal Problem:   Normal labor Active Problems:   Obesity affecting pregnancy, antepartum   Morbid obesity (HCC)   Trichomoniasis    Discharge diagnosis: Term Pregnancy Delivered                                            Postpartum procedures: None  Complications: None  Outpatient Follow-Up: --4-6 weeks --Undecided about contraception, considering outpatient IUD  Hospital course: Leah Kennedy is a 23 y.o. 3732w3d who was admitted for SROM. Her pregnancy was complicated by the following: supervision of normal pregnancy; Chlamydia; Obesity affecting pregnancy, antepartum; Morbid obesity (HCC); Trichomoniasis. Her labor course was uncomplicated. Delivery was uncomplicated. Please see deliverynote for additional details. Her postpartum course was uncomplicated. She was breastfeeding without difficulty. By day of discharge, she was passing flatus, urinating, eating and drinking without difficulty. Her pain was well-controlled, and she was discharged home with her Ibuprofen and Tylenol for pain management. She will follow-up in clinic in 4-6 weeks.   Physical exam  Vitals:   01/18/19 0703 01/18/19 1338 01/18/19 2152 01/19/19 0633  BP: (!) 118/95 (!) 114/58 (!) 124/59 (!) 112/56  Pulse: (!) 56 (!) 59 79 75  Resp: 18 18 18 18   Temp: 98 F (36.7 C) 98.1 F (36.7 C) 98 F (36.7 C) 98.6 F (37 C)  TempSrc: Oral Oral Oral Oral  SpO2:   100%   Weight:      Height:       General: well-appearing, NAD Lochia: appropriate Uterine Fundus: firm Incision: N/A DVT Evaluation: No significant calf/ankle edema  Labs: Lab Results  Component Value Date   WBC 10.7 (H)  01/17/2019   HGB 11.4 (L) 01/17/2019   HCT 34.9 (L) 01/17/2019   MCV 83.5 01/17/2019   PLT 304 01/17/2019   CMP Latest Ref Rng & Units 01/10/2019  Glucose 65 - 99 mg/dL 99  BUN 6 - 20 mg/dL 6  Creatinine 4.090.57 - 8.111.00 mg/dL 9.140.67  Sodium 782134 - 956144 mmol/L 141  Potassium 3.5 - 5.2 mmol/L 4.0  Chloride 96 - 106 mmol/L 107(H)  CO2 20 - 29 mmol/L 19(L)  Calcium 8.7 - 10.2 mg/dL 8.7  Total Protein 6.0 - 8.5 g/dL 6.1  Total Bilirubin 0.0 - 1.2 mg/dL <2.1<0.2  Alkaline Phos 39 - 117 IU/L 116  AST 0 - 40 IU/L 17  ALT 0 - 32 IU/L 9    Discharge instructions: Per After Visit Summary and "Baby and Me Booklet"  After visit meds:  Allergies as of 01/19/2019      Reactions   Carrot [daucus Carota] Itching      Medication List    STOP taking these medications   Comfort Fit Maternity Supp Lg Misc     TAKE these medications   acetaminophen 325 MG tablet Commonly known as: Tylenol Take 2 tablets (650 mg total) by mouth every 4 (four) hours as needed for mild pain.   Blood Pressure Monitor Misc For regular home bp monitoring during pregnancy   cyclobenzaprine 10 MG tablet Commonly known as: FLEXERIL Take 1 tablet (10 mg total) by mouth  every 8 (eight) hours as needed for muscle spasms.   docusate sodium 100 MG capsule Commonly known as: Colace Take 1 capsule (100 mg total) by mouth 2 (two) times daily as needed for moderate constipation.   ibuprofen 800 MG tablet Commonly known as: ADVIL Take 1 tablet (800 mg total) by mouth 3 (three) times daily.   PRENATAL ADULT GUMMY/DHA/FA PO Take by mouth. Takes 2 daily       Postpartum contraception: Undecided, maybe IUD Diet: Routine Diet Activity: Advance as tolerated. Pelvic rest for 6 weeks.   Follow-up Appt:No future appointments. Follow-up Visit:No follow-ups on file.  Newborn Data: Live born female  Birth Weight:  3340g APGAR: 38, 9  Newborn Delivery   Birth date/time: 01/17/2019 14:53:00 Delivery type: Vaginal, Spontaneous      Baby Feeding: Breast Disposition:home with mother

## 2019-01-17 NOTE — Discharge Instructions (Signed)

## 2019-01-17 NOTE — Progress Notes (Signed)
Leah Kennedy is a 23 y.o. G2P0010 at [redacted]w[redacted]d admitted for SROM at 0400 today  Subjective: S/p epidural, denies pain, perineal pressure. FOB sleeping at bedside, supportive when awake  Objective: BP (!) 105/52   Pulse 63   Temp 97.6 F (36.4 C) (Axillary)   Resp 20   Ht 5\' 7"  (1.702 m)   Wt (!) 137.4 kg   LMP 03/29/2018   SpO2 100%   BMI 47.46 kg/m  No intake/output data recorded. No intake/output data recorded.  FHT:  FHR: 125 bpm, variability: moderate,  accelerations:  Present,  decelerations:  Absent UC:   irregular, tracing impeded by maternal habitus SVE:   Dilation: 6 Effacement (%): 80 Station: -1 Exam by:: sowder rnc  Labs: Lab Results  Component Value Date   WBC 10.7 (H) 01/17/2019   HGB 11.4 (L) 01/17/2019   HCT 34.9 (L) 01/17/2019   MCV 83.5 01/17/2019   PLT 304 01/17/2019    Assessment / Plan: Category I tracing Now 8/80/0 by my exam, no augmentation FSE and IUPC placed without difficulty for improved tracing Afebrile Anticipate NSVD  Darlina Rumpf 01/17/2019, 12:24 PM

## 2019-01-17 NOTE — Anesthesia Procedure Notes (Signed)
Epidural Patient location during procedure: OB Start time: 01/17/2019 9:06 AM End time: 01/17/2019 9:25 AM  Staffing Anesthesiologist: Lidia Collum, MD Performed: anesthesiologist   Preanesthetic Checklist Completed: patient identified, pre-op evaluation, timeout performed, IV checked, risks and benefits discussed and monitors and equipment checked  Epidural Patient position: sitting Prep: DuraPrep Patient monitoring: heart rate, continuous pulse ox and blood pressure Approach: midline Location: L3-L4 Injection technique: LOR air  Needle:  Needle type: Tuohy  Needle gauge: 17 G Needle length: 9 cm Needle insertion depth: 9 cm Catheter type: closed end flexible Catheter size: 19 Gauge Catheter at skin depth: 14 cm Test dose: negative  Assessment Events: blood not aspirated, injection not painful, no injection resistance, negative IV test and no paresthesia  Additional Notes Reason for block:procedure for pain

## 2019-01-18 NOTE — Progress Notes (Signed)
Post Partum Day 1 Subjective: no complaints, up ad lib, voiding, tolerating PO and + flatus  Objective: Blood pressure (!) 118/95, pulse (!) 56, temperature 98 F (36.7 C), temperature source Oral, resp. rate 18, height 5\' 7"  (1.702 m), weight (!) 137.4 kg, last menstrual period 03/29/2018, SpO2 100 %, unknown if currently breastfeeding.  Physical Exam:  General: alert, cooperative and appears stated age Lochia: appropriate Uterine Fundus: firm Incision: n/a DVT Evaluation: No evidence of DVT seen on physical exam.  Recent Labs    01/17/19 0756  HGB 11.4*  HCT 34.9*    Assessment/Plan: Plan for discharge tomorrow   LOS: 1 day   Jorje Guild 01/18/2019, 10:54 AM

## 2019-01-18 NOTE — Progress Notes (Signed)
CSW received consult for hx of Depression.  CSW met with MOB to offer support and complete assessment.    CSW met with MOB at bedside to discuss consult for depression, FOB present. CSW asked FOB to leave during assessment, FOB left voluntarily. CSW introduced self and explained reason for consult. MOB was welcoming, open and engaged during assessment. MOB spoke at length about her upbringing and how it attributed to her mental health. MOB reported that she has received the following diagnoses in the past: ADHD, ADD, PTSD, Anxiety and Depression. MOB reported that she grew up in foster care and received different diagnoses from different therapists. MOB reported that she signed herself out of the system when she was 23 years old and this is the happiest she has been. MOB denied any current depressive symptoms. MOB reported that she gets sad at times when she thinks of her great grandfather who passed away April 2019. MOB reported that he was like a father figure and they were very close. CSW offered condolences for MOB's loss. CSW inquired about MOB's support system, MOB reported that her grandmother and FOB were her supports. MOB reported that they plan to stay with her grandmother for a few weeks for additional support, noting that FOB is an essential worker and will have to return to work next week. MOB shared her accomplishments and how far she has came, CSW acknowledged and praised her accomplishments. MOB presented calm and did not demonstrate any acute mental health signs/symptoms. CSW assessed for safety, MOB denied SI and HI.    CSW provided education regarding the baby blues period vs. perinatal mood disorders, discussed treatment and gave resources for mental health follow up if concerns arise.  CSW recommends self-evaluation during the postpartum time period using the New Mom Checklist from Postpartum Progress and encouraged MOB to contact a medical professional if symptoms are noted at any time.     CSW provided review of Sudden Infant Death Syndrome (SIDS) precautions. MOB verbalized understanding and reported that infant has a crib in her room.   CSW identifies no further need for intervention and no barriers to discharge at this time.   , LCSW Clinical Social Worker [facility] Cell#: [phone]   

## 2019-01-18 NOTE — Lactation Note (Addendum)
This note was copied from a baby's chart. Lactation Consultation Note  Patient Name: Leah Kennedy [id number] Date: 01/18/2019 Reason for consult: Initial assessment;1st time breastfeeding;Term P1, 16 hour female infant. Per mom, infant is breastfeeding well he is now breastfeeding 15-20 minutes most feedings. Mom had breastfeed prior to [facility] entering the room, LC unable assess latch at this time.  The last latch assessment by nurse was an  8. Infant had 3 stools and 5 voids since delivery. LC entered room and  dad was hold infant in chair, infant was asleep and mom was  sitting in an  opposite chair along side dad. Mom started using harmony hand pump , this was her choice and had pumped  8 ml of EBM that was  in bottle on a  desk. Per mom, she thinking about breastfeeding infant, then occasionally offer infant back EBM that she pumped. LC  refitted mom  with a  30 ml breast flange for the harmony hand pump she was using size 24 mm which was too small for her nipple size. Parents have been doing a lot of STS. Mom knows to breastfeed infant according to hunger cues, 8 to 12 times within 24 hours and on demand.  Mom knows to call Nurse or [place] if she has any questions, concerns or need assistance with latching infant to breast. LC discussed I & O. Mom made aware of O/P services, breastfeeding support groups, community resources, and our phone # for post-discharge questions.  Maternal Data Formula Feeding for Exclusion: No Has patient been taught Hand Expression?: Yes Does the patient have breastfeeding experience prior to this delivery?: No  Feeding Feeding Type: Bottle Fed - Breast Milk  LATCH Score                   Interventions Interventions: Breast feeding basics reviewed;Skin to skin;Hand express;Hand pump;Position options  Lactation Tools Discussed/Used Tools: Flanges Flange Size: 30 WIC Program: No Pump Review: Setup, frequency, and cleaning;Milk Storage Initiated  by:: by Nurse Date initiated:: 01/18/19   Consult Status Consult Status: Follow-up Date: 01/19/19 Follow-up type: In-patient    [name] 01/18/2019, 8:11 PM

## 2019-01-18 NOTE — Anesthesia Postprocedure Evaluation (Signed)
Anesthesia Post Note  Patient: Leah Kennedy  Procedure(s) Performed: AN AD HOC LABOR EPIDURAL     Patient location during evaluation: Mother Baby Anesthesia Type: Epidural Level of consciousness: awake and alert, oriented and patient cooperative Pain management: pain level controlled Vital Signs Assessment: post-procedure vital signs reviewed and stable Respiratory status: spontaneous breathing Cardiovascular status: stable Postop Assessment: no headache, epidural receding, patient able to bend at knees, no signs of nausea or vomiting and able to ambulate Anesthetic complications: no Comments: Pt. Interviewed via phone consultation d/t COVID 19 precautions.  Pt. States she is walking.  Pain score 0.     Last Vitals:  Vitals:   01/18/19 0205 01/18/19 0703  BP: (!) 111/55 (!) 118/95  Pulse: (!) 56 (!) 56  Resp: 18 18  Temp: 36.7 C 36.7 C  SpO2:      Last Pain:  Vitals:   01/18/19 0703  TempSrc: Oral  PainSc:    Pain Goal:                   [facility]

## 2019-01-19 MED ORDER — DOCUSATE SODIUM 100 MG PO CAPS: 100.0000 mg | ORAL_CAPSULE | Freq: Two times a day (BID) | ORAL | 2 refills | Status: DC | PRN

## 2019-01-19 MED ORDER — ACETAMINOPHEN 325 MG PO TABS: 650.0000 mg | ORAL_TABLET | ORAL | Status: DC | PRN

## 2019-01-19 MED ORDER — IBUPROFEN 800 MG PO TABS: 800.0000 mg | ORAL_TABLET | Freq: Three times a day (TID) | ORAL | 0 refills | Status: DC

## 2019-01-19 NOTE — Plan of Care (Signed)
Patient appropriate for discharge.

## 2019-01-19 NOTE — Lactation Note (Signed)
This note was copied from a baby's chart. Lactation Consultation Note  Patient Name: Leah Kennedy [id number] Date: 01/19/2019 Reason for consult: Follow-up assessment;Term;1st time breastfeeding;Primapara  P1 mother whose infant is now 22 hours old.  Mother was breast feeding baby when I arrived.  She feels like breast feeding is going well and had no immediate questions/concerns.   Mother's breasts are large, soft and non tender and nipples are everted and intact.  Discussed performing hand expression before/after feedings and breast compressions during feedings.    Mother will continue feeding 8-12 times/24 hours or sooner if baby shows feeding cues.  Engorgement prevention/treatment reviewed.  Mother will be a "stay at home" mother and has a manual pump.  She does not desire a DEBP.  She also has our OP phone number for questions/concerns after discharge.  Her grandmother and FOB will be her support system.  Father present.   Maternal Data Formula Feeding for Exclusion: No Has patient been taught Hand Expression?: Yes Does the patient have breastfeeding experience prior to this delivery?: No  Feeding Feeding Type: Breast Fed  LATCH Score Latch: Grasps breast easily, tongue down, lips flanged, rhythmical sucking.  Audible Swallowing: A few with stimulation  Type of Nipple: Everted at rest and after stimulation  Comfort (Breast/Nipple): Soft / non-tender  Hold (Positioning): No assistance needed to correctly position infant at breast.  LATCH Score: 9  Interventions Interventions: Breast feeding basics reviewed;Skin to skin;Breast massage;Breast compression;Hand pump  Lactation Tools Discussed/Used     Consult Status Consult Status: Complete Date: 01/19/19 Follow-up type: Call as needed    Mykael Batz R Luccas Towell 01/19/2019, 7:40 AM

## 2019-01-21 DIAGNOSIS — O9089 Other complications of the puerperium, not elsewhere classified: Secondary | ICD-10-CM | POA: Diagnosis not present

## 2019-01-21 DIAGNOSIS — E876 Hypokalemia: Secondary | ICD-10-CM | POA: Diagnosis not present

## 2019-01-21 DIAGNOSIS — M545 Low back pain: Secondary | ICD-10-CM | POA: Diagnosis not present

## 2019-01-21 DIAGNOSIS — R609 Edema, unspecified: Secondary | ICD-10-CM | POA: Diagnosis not present

## 2019-01-21 DIAGNOSIS — M79651 Pain in right thigh: Secondary | ICD-10-CM | POA: Diagnosis not present

## 2019-01-28 ENCOUNTER — Telehealth: Payer: Self-pay | Admitting: *Deleted

## 2019-01-28 ENCOUNTER — Telehealth: Payer: Self-pay | Admitting: Women's Health

## 2019-01-28 NOTE — Telephone Encounter (Signed)
Would like to talk to a nurse about some issues she is having and if she can go back to work

## 2019-01-28 NOTE — Telephone Encounter (Signed)
Patient states she had her baby 11 days ago and wants to know when she can return to work.  Informed patient we generally try to get patients to wait at least 4 weeks or until their postpartum visit.  Pt verbalized understanding and stated she would try to wait.

## 2019-02-24 ENCOUNTER — Telehealth: Payer: Medicaid Other | Admitting: Women's Health

## 2019-03-04 ENCOUNTER — Other Ambulatory Visit: Payer: Self-pay

## 2019-03-04 ENCOUNTER — Encounter: Payer: Self-pay | Admitting: Women's Health

## 2019-03-04 ENCOUNTER — Telehealth (INDEPENDENT_AMBULATORY_CARE_PROVIDER_SITE_OTHER): Payer: Medicaid Other | Admitting: Women's Health

## 2019-03-04 NOTE — Progress Notes (Signed)
TELEHEALTH VIRTUAL POSTPARTUM VISIT ENCOUNTER NOTE Patient name: Leah Kennedy MRN [id number]  Date of birth: [birth year]  I connected with patient on 03/04/19 at 11:00 AM EDT by telephone (wasn't able to access mychart) and verified that I am speaking with the correct person using two identifiers. Due to COVID-19 recommendations, pt is not currently in our office.    I discussed the limitations, risks, security and privacy concerns of performing an evaluation and management service by telephone and the availability of in person appointments. I also discussed with the patient that there may be a patient responsible charge related to this service. The patient expressed understanding and agreed to proceed.  Chief Complaint:   postpartum visit  History of Present Illness:   Leah Kennedy is a 23 y.o. G56P1011 African American female being evaluated today for a postpartum visit. She is 6 weeks postpartum following a spontaneous vaginal delivery at 39.3 gestational weeks. Anesthesia: epidural. Laceration: 2nd degree. I have fully reviewed the prenatal and intrapartum course. Pregnancy uncomplicated. Postpartum course has been uncomplicated. Bleeding no bleeding. Bowel function is normal. Bladder function is normal.  Patient is not sexually active. Last sexual activity: prior to birth of baby.  Contraception method is condoms.  Last pap turns 23yo on 8/31.  Results were n/a .  No LMP recorded.  Baby's course has been uncomplicated. Baby is feeding by breast & bottle   Edinburgh Postpartum Depression Screening: negative Edinburgh Postnatal Depression Scale - 03/04/19 1127      Edinburgh Postnatal Depression Scale:  In the Past 7 Days   I have been able to laugh and see the funny side of things.  0    I have looked forward with enjoyment to things.  0    I have blamed myself unnecessarily when things went wrong.  1    I have been anxious or worried for no good reason.  1    I have felt scared  or panicky for no good reason.  0    Things have been getting on top of me.  1    I have been so unhappy that I have had difficulty sleeping.  0    I have felt sad or miserable.  0    I have been so unhappy that I have been crying.  0    The thought of harming myself has occurred to me.  0    Edinburgh Postnatal Depression Scale Total  3      Review of Systems:   Pertinent items are noted in HPI Denies Abnormal vaginal discharge w/ itching/odor/irritation, headaches, visual changes, shortness of breath, chest pain, abdominal pain, severe nausea/vomiting, or problems with urination or bowel movements. Pertinent History Reviewed:  Reviewed past medical,surgical, obstetrical and family history.  Reviewed problem list, medications and allergies. OB History  Gravida Para Term Preterm AB Living  2 1 1   1 1   SAB TAB Ectopic Multiple Live Births  1     0 1    # Outcome Date GA Lbr Len/2nd Weight Sex Delivery Anes PTL Lv  2 Term 01/17/19 [redacted]w[redacted]d 10:35 / 00:18 7 lb 5.8 oz (3.34 kg) M Vag-Spont EPI  LIV  1 SAB 05/2017           Physical Assessment:   Vitals:   03/04/19 1124  Weight: 267 lb (121.1 kg)  Height: 5\' 6"  (1.676 m)  Body mass index is 43.09 kg/m.       Physical Examination:  General:  Alert, oriented and cooperative.   Mental Status: Normal mood and affect perceived. Normal judgment and thought content.  Rest of physical exam deferred due to type of encounter       No results found for this or any previous visit (from the past 24 hour(s)).  Assessment & Plan:  1) Postpartum exam 2) 6 wks s/p SVB 3) Breast & bottlefeeding 4) Depression screening 5) Contraception counseling, pt prefers condoms  Meds: No orders of the defined types were placed in this encounter.   I discussed the assessment and treatment plan with the patient. The patient was provided an opportunity to ask questions and all were answered. The patient agreed with the plan and demonstrated an understanding  of the instructions.   The patient was advised to call back or seek an in-person evaluation/go to the ED for any concerning postpartum symptoms.  I provided 15 minutes of non-face-to-face time during this encounter.  Follow-up: Return for after 8/31 for , Pap & physical.   No orders of the defined types were placed in this encounter.   Cheral MarkerKimberly R Penda Venturi CNM, [facility]NP-BC 03/04/2019 11:42 AM

## 2019-03-04 NOTE — Patient Instructions (Signed)
Tips To Increase Milk Supply  Lots of water! Enough so that your urine is clear  Plenty of calories, if you're not getting enough calories, your milk supply can decrease  Breastfeed/pump often, every 2-3 hours x 20-30mins  Fenugreek 3 pills 3 times a day, this may make your urine smell like maple syrup  Mother's Milk Tea  Lactation cookies, google for the recipe  Real oatmeal   

## 2019-03-11 DIAGNOSIS — Z20828 Contact with and (suspected) exposure to other viral communicable diseases: Secondary | ICD-10-CM | POA: Diagnosis not present

## 2019-03-25 ENCOUNTER — Other Ambulatory Visit: Payer: Medicaid Other | Admitting: Women's Health

## 2019-05-11 IMAGING — DX DG ANKLE COMPLETE 3+V*R*
3 series · 3 of 3 positions shown · non-contrast
Comparison: None.

CLINICAL DATA: Right ankle pain and swelling after injury today.

EXAM:
RIGHT ANKLE - COMPLETE 3+ VIEW

[ankle ap]
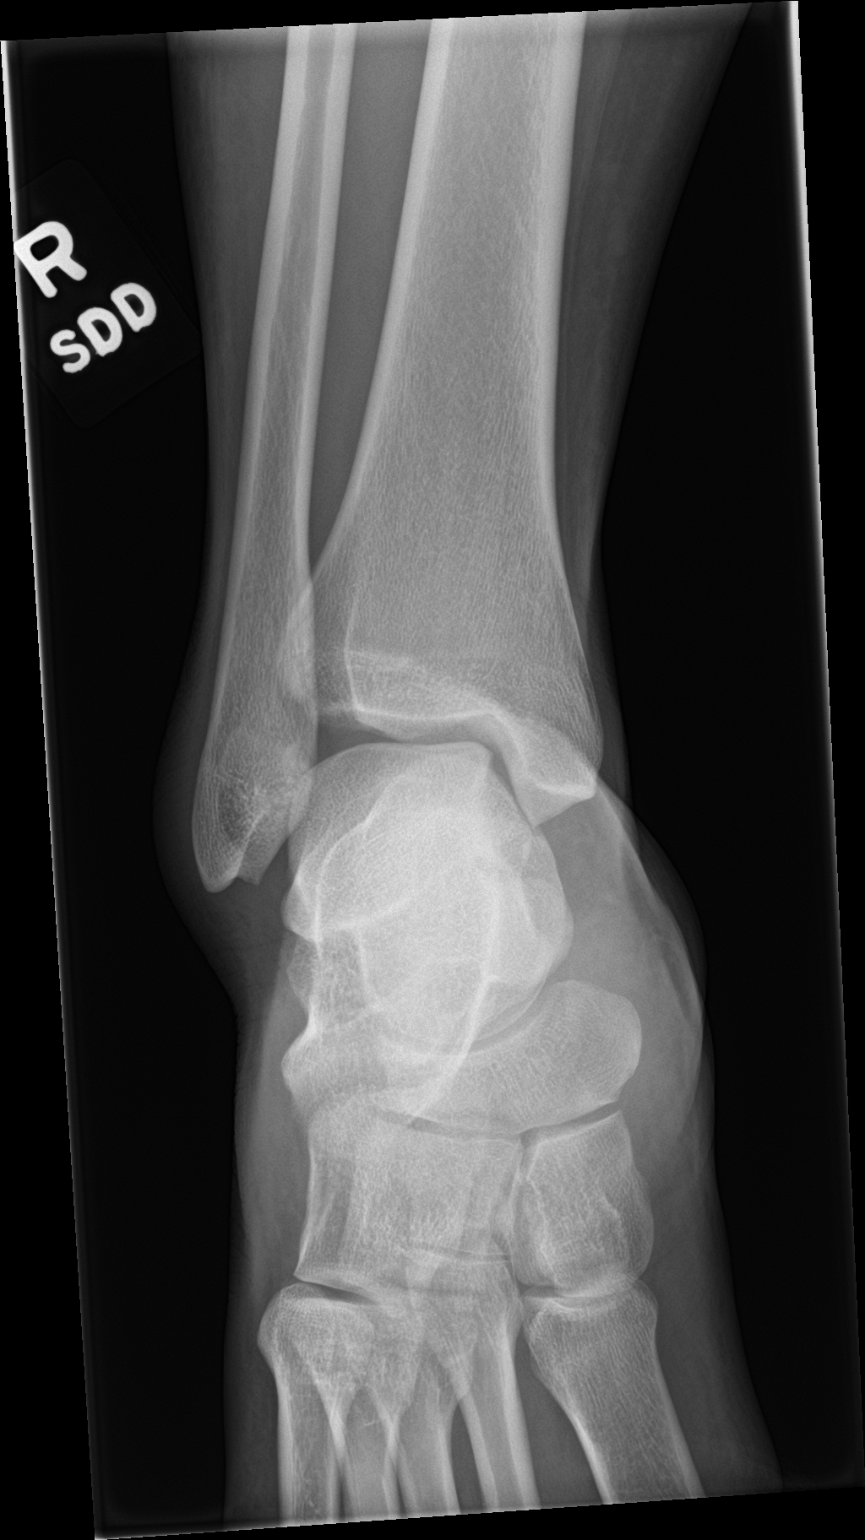

[ankle obl]
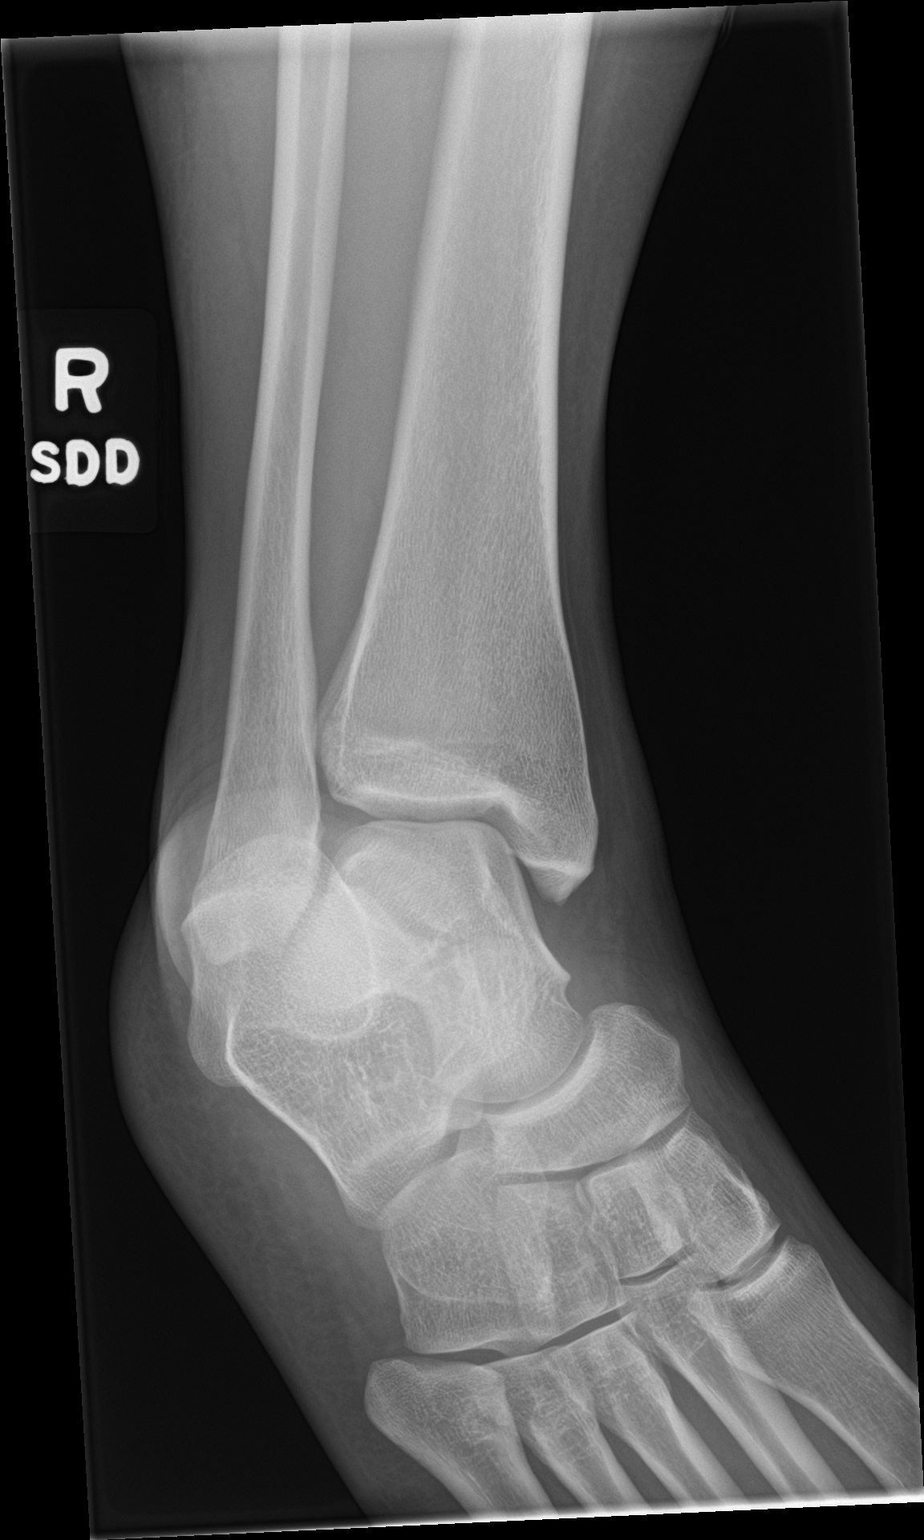

[ankle lat]
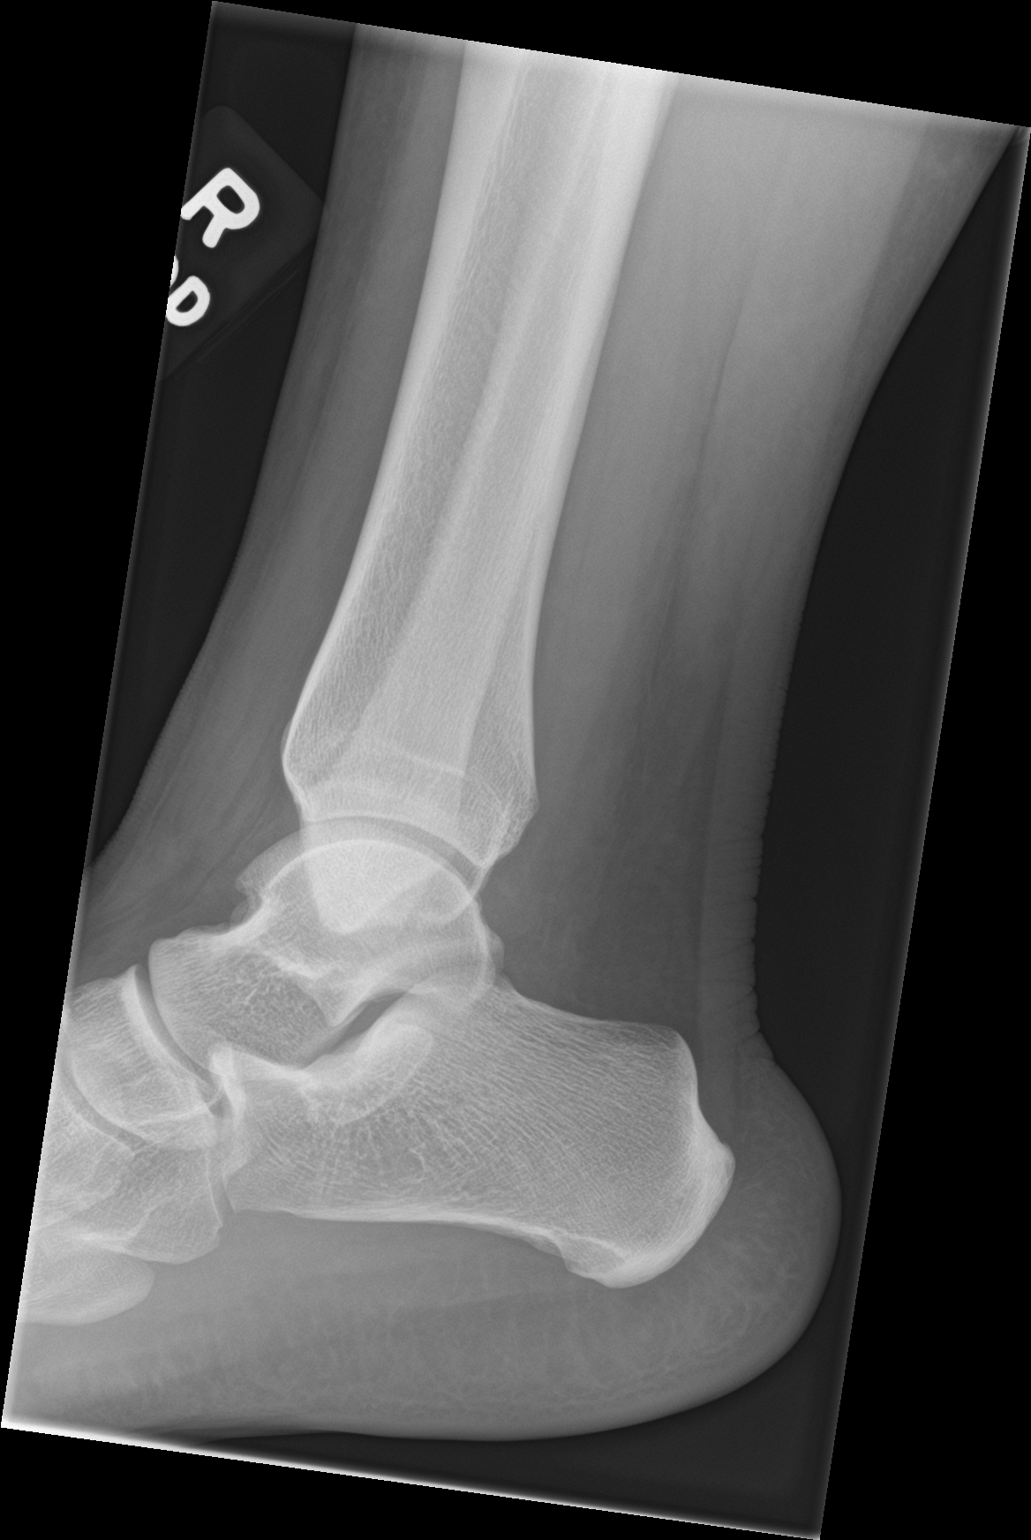

[3 of 3 positions shown; findings below may reference images not displayed]

FINDINGS: There is no evidence of fracture, dislocation, or joint effusion.
There is no evidence of arthropathy or other focal bone abnormality.
Soft tissues are unremarkable.
IMPRESSION: Normal right ankle.

## 2019-06-03 ENCOUNTER — Other Ambulatory Visit: Payer: Medicaid Other | Admitting: Women's Health

## 2019-09-09 ENCOUNTER — Ambulatory Visit: Payer: Medicaid Other | Admitting: Family Medicine

## 2019-09-22 ENCOUNTER — Ambulatory Visit: Payer: Medicaid Other | Admitting: Women's Health

## 2019-11-19 ENCOUNTER — Encounter: Payer: Self-pay | Admitting: Women's Health

## 2019-11-19 ENCOUNTER — Other Ambulatory Visit (HOSPITAL_COMMUNITY)
Admission: RE | Admit: 2019-11-19 | Discharge: 2019-11-19 | Disposition: A | Discharge status: Home / Self Care | Payer: Medicaid Other | Source: Ambulatory Visit | Attending: Obstetrics & Gynecology | Admitting: Obstetrics & Gynecology

## 2019-11-19 ENCOUNTER — Other Ambulatory Visit: Payer: Self-pay

## 2019-11-19 ENCOUNTER — Ambulatory Visit (INDEPENDENT_AMBULATORY_CARE_PROVIDER_SITE_OTHER): Payer: Medicaid Other | Admitting: Women's Health

## 2019-11-19 VITALS — BP 132/89 | HR 90 | Ht 67.0 in | Wt 303.8 lb

## 2019-11-19 DIAGNOSIS — E669 Obesity, unspecified: Secondary | ICD-10-CM

## 2019-11-19 DIAGNOSIS — Z01419 Encounter for gynecological examination (general) (routine) without abnormal findings: Secondary | ICD-10-CM | POA: Diagnosis not present

## 2019-11-19 DIAGNOSIS — Z0000 Encounter for general adult medical examination without abnormal findings: Secondary | ICD-10-CM

## 2019-11-19 DIAGNOSIS — Z01411 Encounter for gynecological examination (general) (routine) with abnormal findings: Secondary | ICD-10-CM | POA: Diagnosis not present

## 2019-11-19 DIAGNOSIS — A749 Chlamydial infection, unspecified: Secondary | ICD-10-CM

## 2019-11-19 DIAGNOSIS — Z113 Encounter for screening for infections with a predominantly sexual mode of transmission: Secondary | ICD-10-CM

## 2019-11-19 DIAGNOSIS — Z6841 Body Mass Index (BMI) 40.0 and over, adult: Secondary | ICD-10-CM

## 2019-11-19 NOTE — Patient Instructions (Signed)
No sex after 5/2

## 2019-11-19 NOTE — Progress Notes (Signed)
WELL-WOMAN EXAMINATION Patient name: Leah Kennedy MRN [id number]  Date of birth: 29-Nov-1995 Chief Complaint:   Gynecologic Exam  History of Present Illness:   Leah Kennedy is a 24 y.o. G21P1011 African American female being seen today for a routine well-woman exam.  Current complaints: interested in gastric bypass. Weight fluctuates from 285-300lbs. Has tried multiple different diets, exercise/cardio without success.   Depression screen [facility] 2/9 11/19/2019 07/01/2018  Decreased Interest 2 1  Down, Depressed, Hopeless 0 0  PHQ - 2 Score 2 1  Altered sleeping 1 2  Tired, decreased energy 2 1  Change in appetite 2 1  Feeling bad or failure about yourself  1 0  Trouble concentrating 0 0  Moving slowly or fidgety/restless 0 0  Suicidal thoughts 0 0  PHQ-9 Score 8 5  Difficult doing work/chores Not difficult at all -     PCP: none      does desire STD screen Patient's last menstrual period was 11/07/2019 (exact date). The current method of family planning is none and wants IUD Last pap never. Results were: N/A. H/O abnormal pap: no Last mammogram: never. Results were: N/A. Family h/o breast cancer: no Last colonoscopy: never. Results were: N/A. Family h/o colorectal cancer: no Review of Systems:   Pertinent items are noted in HPI Denies any headaches, blurred vision, fatigue, shortness of breath, chest pain, abdominal pain, abnormal vaginal discharge/itching/odor/irritation, problems with periods, bowel movements, urination, or intercourse unless otherwise stated above. Pertinent History Reviewed:  Reviewed past medical,surgical, social and family history.  Reviewed problem list, medications and allergies. Physical Assessment:   Vitals:   11/19/19 1609 11/19/19 1710  BP: 135/90 132/89  Pulse: 98 90  Weight: (!) 303 lb 12.8 oz (137.8 kg)   Height: 5\' 7"  (1.702 m)   Body mass index is 47.58 kg/m.        Physical Examination:   General appearance - well appearing, and in no  distress  Mental status - alert, oriented to person, place, and time  Psych:  She has a normal mood and affect  Skin - warm and dry, normal color, no suspicious lesions noted  Chest - effort normal, all lung fields clear to auscultation bilaterally  Heart - normal rate and regular rhythm  Neck:  midline trachea, no thyromegaly or nodules  Breasts - breasts appear normal, no suspicious masses, no skin or nipple changes or  axillary nodes  Abdomen - soft, nontender, nondistended, no masses or organomegaly  Pelvic - VULVA: normal appearing vulva with no masses, tenderness or lesions  VAGINA: normal appearing vagina with normal color and discharge, no lesions  CERVIX: normal appearing cervix without discharge or lesions, no CMT  Thin prep pap is done w/ HR HPV cotesting  UTERUS: uterus is felt to be normal size, shape, consistency and nontender   ADNEXA: No adnexal masses or tenderness noted.  Extremities:  No swelling or varicosities noted  Chaperone:    No results found for this or any previous visit (from the past 24 hour(s)).  Assessment & Plan:  1) Well-Woman Exam  2) Obesity, BMI 47> interested in gastric bypass, will try dietician first,  referral ordered.  3) STD screen  4) Contraception counseling> should be on period week of 5/17, schedule IUD insertion for then, no sex after 5/2  Labs/procedures today: pap, STD screen  Mammogram @22yo  or sooner if problems Colonoscopy @22yo  or sooner if problems  Orders Placed This Encounter  Procedures  . HIV Antibody (  routine testing w rflx)  . RPR  . Amb ref to Medical Nutrition Therapy-MNT    Meds: No orders of the defined types were placed in this encounter.   Follow-up: Return in about 19 days (around 12/08/2019) for IUD insertion.  [place], [facility] 11/19/2019 5:11 PM

## 2019-11-21 LAB — CYTOLOGY - PAP
Chlamydia: POSITIVE — AB
Comment: NEGATIVE
Comment: NORMAL
Diagnosis: NEGATIVE
Neisseria Gonorrhea: NEGATIVE

## 2019-11-24 ENCOUNTER — Other Ambulatory Visit: Payer: Self-pay | Admitting: Women's Health

## 2019-11-24 DIAGNOSIS — A749 Chlamydial infection, unspecified: Secondary | ICD-10-CM

## 2019-11-24 MED ORDER — AZITHROMYCIN 500 MG PO TABS: 1000.0000 mg | ORAL_TABLET | Freq: Once | ORAL | 0 refills | Status: AC

## 2019-12-07 IMAGING — DX DG FOOT COMPLETE 3+V*L*
3 series · 3 of 3 positions shown · non-contrast
Comparison: None.

CLINICAL DATA: Foot pain after running a race.

EXAM:
LEFT FOOT - COMPLETE 3+ VIEW

[foot ap]
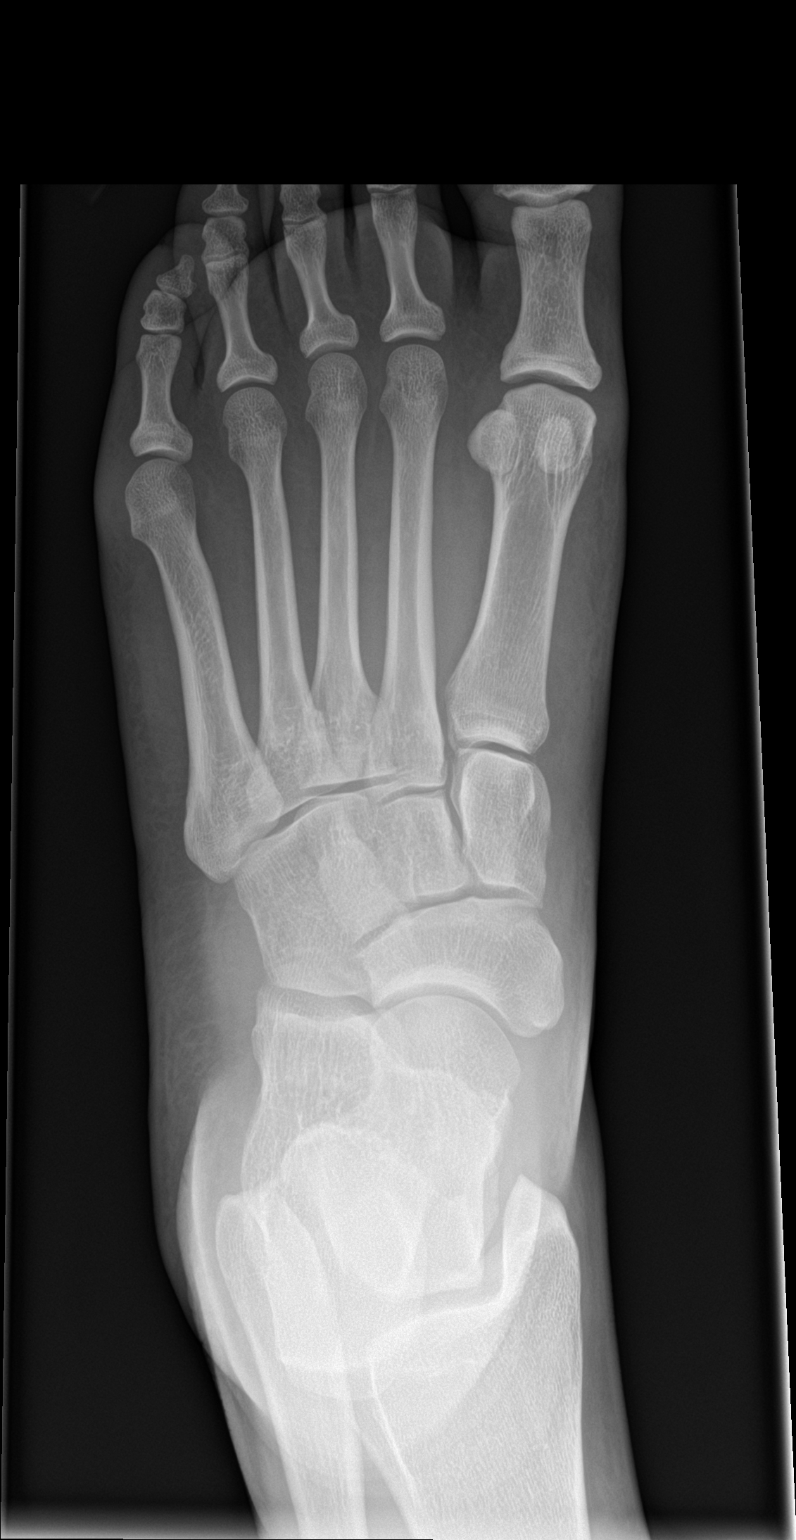

[foot obl]
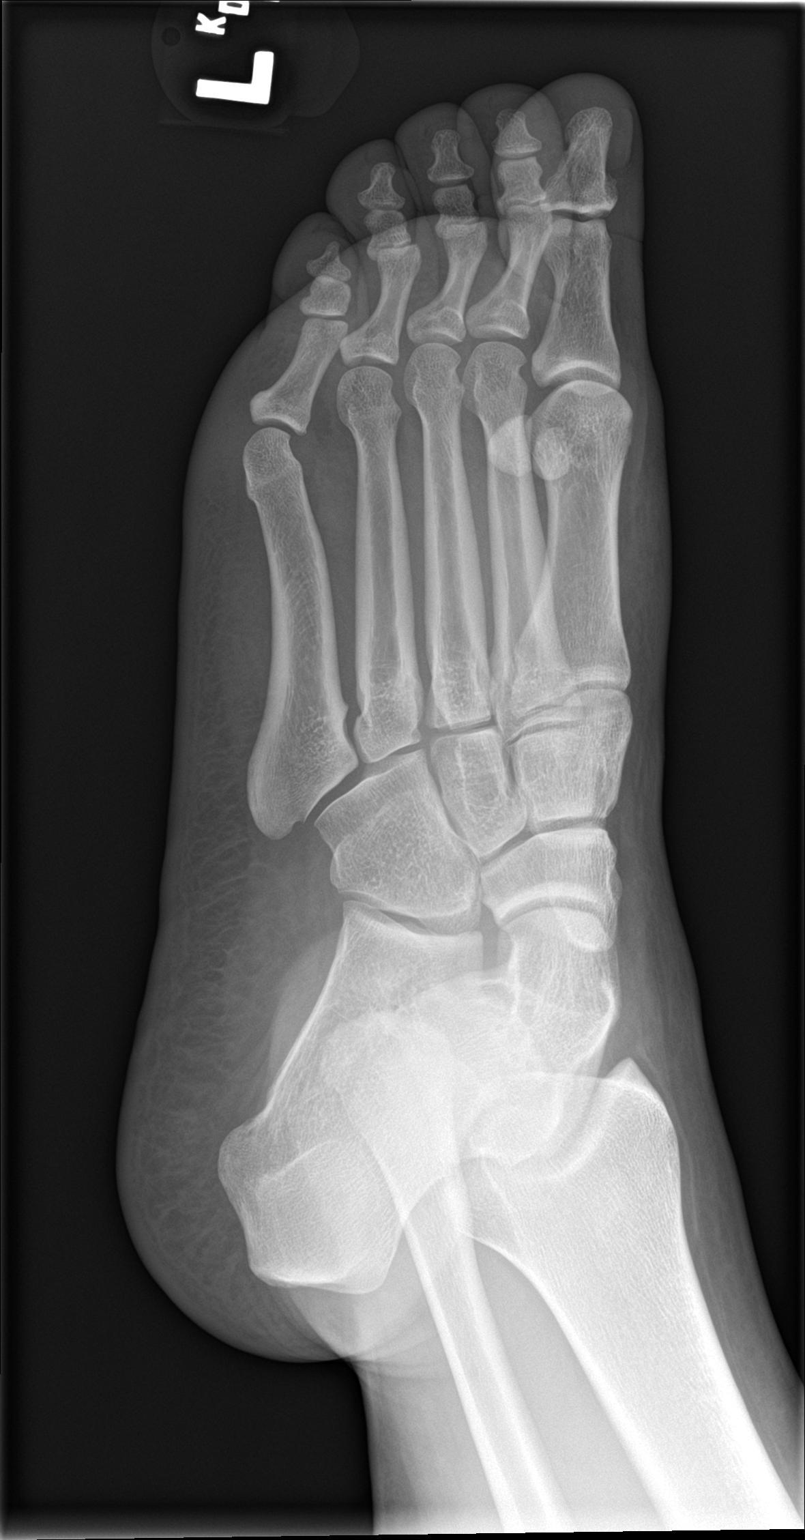

[foot lat]
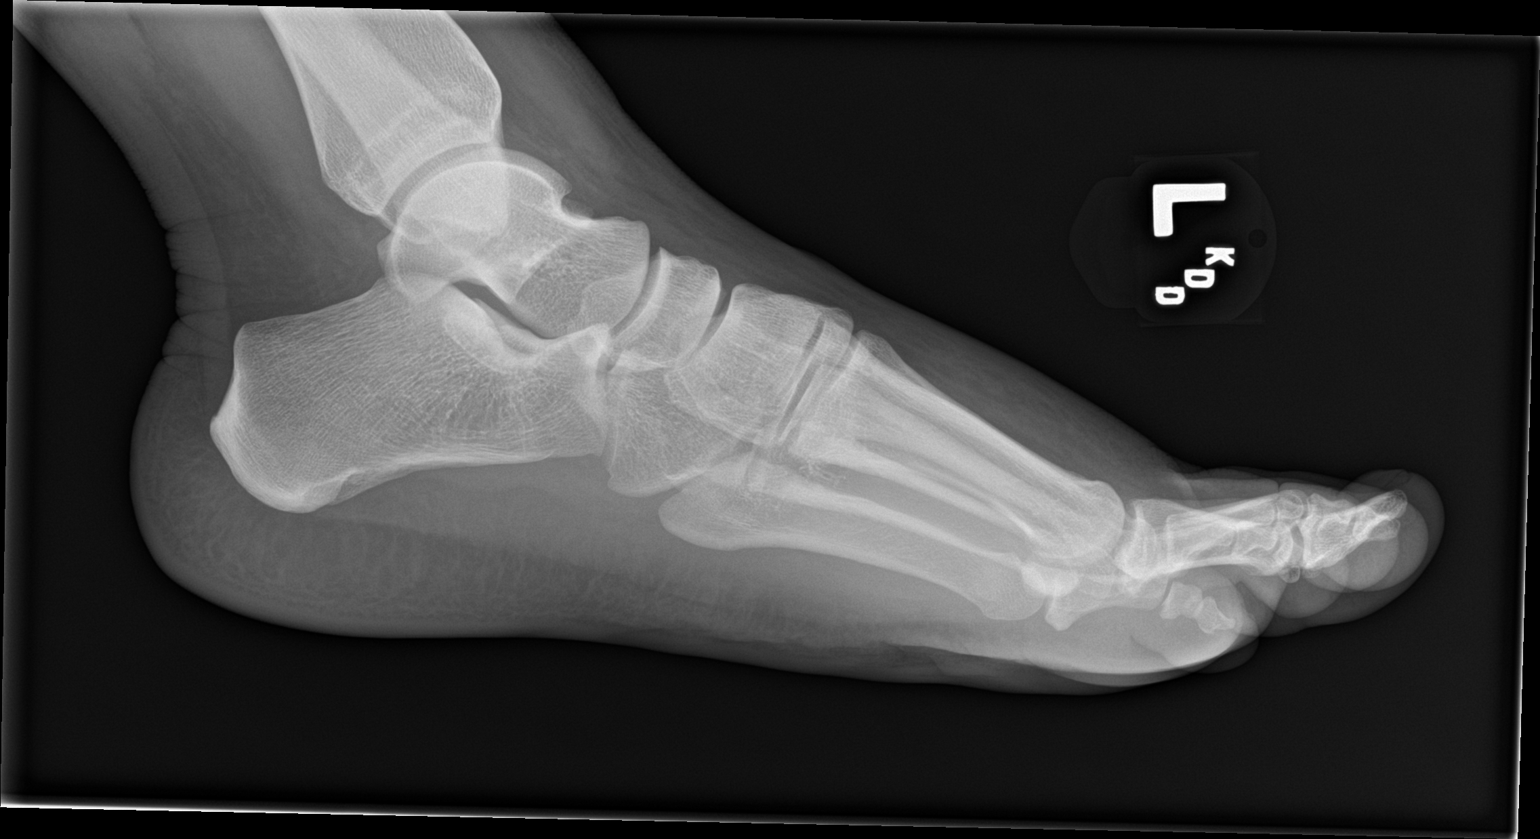

[3 of 3 positions shown; findings below may reference images not displayed]

FINDINGS: There is no evidence of fracture or dislocation. There is no
evidence of arthropathy or other focal bone abnormality. Soft
tissues are unremarkable.
IMPRESSION: No fracture or dislocation of the left foot.

## 2019-12-08 ENCOUNTER — Ambulatory Visit: Payer: Medicaid Other | Admitting: Women's Health

## 2020-01-15 ENCOUNTER — Ambulatory Visit: Payer: Medicaid Other | Admitting: Nutrition

## 2020-03-16 ENCOUNTER — Ambulatory Visit (INDEPENDENT_AMBULATORY_CARE_PROVIDER_SITE_OTHER): Payer: Medicaid Other | Admitting: *Deleted

## 2020-03-16 ENCOUNTER — Encounter: Payer: Self-pay | Admitting: *Deleted

## 2020-03-16 VITALS — BP 131/84 | HR 89 | Ht 66.5 in | Wt 299.0 lb

## 2020-03-16 DIAGNOSIS — Z3201 Encounter for pregnancy test, result positive: Secondary | ICD-10-CM

## 2020-03-16 LAB — POCT URINE PREGNANCY: Preg Test, Ur: POSITIVE — AB

## 2020-03-16 NOTE — Progress Notes (Signed)
° °  NURSE VISIT- PREGNANCY CONFIRMATION   SUBJECTIVE:  Leah Kennedy is a 24 y.o. G28P1011 female at [redacted]w[redacted]d by certain LMP of Patient's last menstrual period was 01/08/2020 (approximate). Here for pregnancy confirmation.  Home pregnancy test: positive x three.  She reports nausea.  She is not taking prenatal vitamins.    OBJECTIVE:  BP 131/84 (BP Location: Right Wrist, Patient Position: Sitting, Cuff Size: Normal)    Pulse 89    Ht 5' 6.5" (1.689 m)    Wt 299 lb (135.6 kg)    LMP 01/08/2020 (Approximate)    BMI 47.54 kg/m   Appears well, in no apparent distress OB History  Gravida Para Term Preterm AB Living  3 1 1   1 1   SAB TAB Ectopic Multiple Live Births  1     0 1    # Outcome Date GA Lbr Len/2nd Weight Sex Delivery Anes PTL Lv  3 Current           2 Term 01/17/19 110w3d 10:35 / 00:18 7 lb 5.8 oz (3.34 kg) M Vag-Spont EPI  LIV  1 SAB 05/2017            Results for orders placed or performed in visit on 03/16/20 (from the past 24 hour(s))  POCT urine pregnancy   Collection Time: 03/16/20  3:13 PM  Result Value Ref Range   Preg Test, Ur Positive (A) Negative    ASSESSMENT: Positive pregnancy test, [redacted]w[redacted]d by LMP    PLAN: Schedule for dating ultrasound in asap. Prenatal vitamins: note routed to [redacted]w[redacted]d, NP to send prescription   Nausea medicines: not currently needed   OB packet given: Yes  Adline Potter  03/16/2020 3:14 PM

## 2020-03-17 MED ORDER — PRENATAL PLUS 27-1 MG PO TABS: 1.0000 | ORAL_TABLET | Freq: Every day | ORAL | 12 refills | Status: DC

## 2020-03-17 NOTE — Addendum Note (Signed)
Addended by: Cyril Mourning A on: 03/17/2020 08:18 AM   Modules accepted: Orders

## 2020-03-17 NOTE — Progress Notes (Addendum)
Chart reviewed for nurse visit. Agree with plan of care. Will rx Prenatal plus 1 daily Adline Potter, NP 03/17/2020 8:17 AM

## 2020-03-22 ENCOUNTER — Other Ambulatory Visit: Payer: Self-pay | Admitting: Obstetrics & Gynecology

## 2020-03-22 DIAGNOSIS — O3680X0 Pregnancy with inconclusive fetal viability, not applicable or unspecified: Secondary | ICD-10-CM

## 2020-03-24 ENCOUNTER — Ambulatory Visit (INDEPENDENT_AMBULATORY_CARE_PROVIDER_SITE_OTHER): Payer: Medicaid Other

## 2020-03-24 DIAGNOSIS — O3680X0 Pregnancy with inconclusive fetal viability, not applicable or unspecified: Secondary | ICD-10-CM | POA: Diagnosis not present

## 2020-03-24 NOTE — Progress Notes (Signed)
[place] 8+6 wks,single IUP,positive FHT 164 bpm,CRL 22.40 mm,normal left ovary,simple right exophytic ovarian cyst 2.3 x 1.8 x 2.3 cm

## 2020-04-01 ENCOUNTER — Telehealth: Payer: Self-pay | Admitting: Advanced Practice Midwife

## 2020-04-01 ENCOUNTER — Telehealth: Payer: Self-pay | Admitting: *Deleted

## 2020-04-01 MED ORDER — PROMETHAZINE HCL 25 MG PO TABS: 25.0000 mg | ORAL_TABLET | Freq: Four times a day (QID) | ORAL | 1 refills | Status: DC | PRN

## 2020-04-01 MED ORDER — PRENATAL PLUS 27-1 MG PO TABS: 1.0000 | ORAL_TABLET | Freq: Every day | ORAL | 12 refills | Status: DC

## 2020-04-01 NOTE — Telephone Encounter (Signed)
Patient states she is having severe nausea.  She is hungry but is unable to keep any food down.  She did not experience this with her first pregnancy.  She is requesting nausea medication along with a different prenatal vitamin (not gummies).  Requesting these be sent to [facility] in [place].

## 2020-04-01 NOTE — Addendum Note (Signed)
Addended by: Cyril Mourning A on: 04/01/2020 01:23 PM   Modules accepted: Orders

## 2020-04-01 NOTE — Telephone Encounter (Signed)
Pt wanted to know if we can call in some nausea meds for her/ [facility]

## 2020-04-01 NOTE — Telephone Encounter (Signed)
Left message that rx sent for PNV and phenergan

## 2020-04-16 ENCOUNTER — Other Ambulatory Visit: Payer: Self-pay | Admitting: Obstetrics & Gynecology

## 2020-04-16 DIAGNOSIS — Z3682 Encounter for antenatal screening for nuchal translucency: Secondary | ICD-10-CM

## 2020-04-21 ENCOUNTER — Other Ambulatory Visit: Payer: Self-pay

## 2020-04-21 ENCOUNTER — Other Ambulatory Visit: Payer: Medicaid Other

## 2020-04-21 ENCOUNTER — Ambulatory Visit (INDEPENDENT_AMBULATORY_CARE_PROVIDER_SITE_OTHER): Payer: Medicaid Other | Admitting: Women's Health

## 2020-04-21 ENCOUNTER — Ambulatory Visit: Payer: Medicaid Other | Admitting: *Deleted

## 2020-04-21 ENCOUNTER — Encounter: Payer: Self-pay | Admitting: Women's Health

## 2020-04-21 VITALS — BP 128/83 | HR 84 | Wt 295.0 lb

## 2020-04-21 DIAGNOSIS — Z3481 Encounter for supervision of other normal pregnancy, first trimester: Secondary | ICD-10-CM

## 2020-04-21 DIAGNOSIS — Z3A12 12 weeks gestation of pregnancy: Secondary | ICD-10-CM | POA: Diagnosis not present

## 2020-04-21 DIAGNOSIS — Z3480 Encounter for supervision of other normal pregnancy, unspecified trimester: Secondary | ICD-10-CM | POA: Diagnosis not present

## 2020-04-21 DIAGNOSIS — Z331 Pregnant state, incidental: Secondary | ICD-10-CM

## 2020-04-21 DIAGNOSIS — Z1389 Encounter for screening for other disorder: Secondary | ICD-10-CM

## 2020-04-21 DIAGNOSIS — Z3490 Encounter for supervision of normal pregnancy, unspecified, unspecified trimester: Secondary | ICD-10-CM | POA: Insufficient documentation

## 2020-04-21 LAB — POCT URINALYSIS DIPSTICK OB
Blood, UA: NEGATIVE
Glucose, UA: NEGATIVE
Ketones, UA: NEGATIVE
Leukocytes, UA: NEGATIVE
Nitrite, UA: NEGATIVE
POC,PROTEIN,UA: NEGATIVE

## 2020-04-21 MED ORDER — DOXYLAMINE-PYRIDOXINE 10-10 MG PO TBEC: DELAYED_RELEASE_TABLET | ORAL | 6 refills | Status: DC

## 2020-04-21 NOTE — Progress Notes (Signed)
INITIAL OBSTETRICAL VISIT Patient name: Leah Kennedy MRN [id number]  Date of birth: 03/05/1996 Chief Complaint:   Initial Prenatal Visit (nausea)  History of Present Illness:   Leah Kennedy is a 24 y.o. G40P1011 African American female at [redacted]w[redacted]d by [place] at 8 weeks with an Estimated Date of Delivery: 10/28/20 being seen today for her initial obstetrical visit.   Her obstetrical history is significant for SAB x 1, then term uncomplicated SVB x 1- had some labile bp's at end of pregnancy, never dx w/ GHTN.   Today she reports n/v.  Depression screen [facility] 2/9 04/21/2020 11/19/2019 07/01/2018  Decreased Interest 0 2 1  Down, Depressed, Hopeless 0 0 0  PHQ - 2 Score 0 2 1  Altered sleeping 2 1 2   Tired, decreased energy 2 2 1   Change in appetite 2 2 1   Feeling bad or failure about yourself  0 1 0  Trouble concentrating 0 0 0  Moving slowly or fidgety/restless 0 0 0  Suicidal thoughts 0 0 0  PHQ-9 Score 6 8 5   Difficult doing work/chores - Not difficult at all -    Patient's last menstrual period was 01/08/2020 (approximate). Last pap 11/19/19. Results were: normal Review of Systems:   Pertinent items are noted in HPI Denies cramping/contractions, leakage of fluid, vaginal bleeding, abnormal vaginal discharge w/ itching/odor/irritation, headaches, visual changes, shortness of breath, chest pain, abdominal pain, severe nausea/vomiting, or problems with urination or bowel movements unless otherwise stated above.  Pertinent History Reviewed:  Reviewed past medical,surgical, social, obstetrical and family history.  Reviewed problem list, medications and allergies. OB History  Gravida Para Term Preterm AB Living  3 1 1   1 1   SAB TAB Ectopic Multiple Live Births  1     0 1    # Outcome Date GA Lbr Len/2nd Weight Sex Delivery Anes PTL Lv  3 Current           2 Term 01/17/19 [redacted]w[redacted]d 10:35 / 00:18 7 lb 5.8 oz (3.34 kg) M Vag-Spont EPI N LIV  1 SAB 05/2017           Physical Assessment:    Vitals:   04/21/20 1422  BP: 128/83  Pulse: 84  Weight: 295 lb (133.8 kg)  Body mass index is 46.9 kg/m.       Physical Examination:  General appearance - well appearing, and in no distress  Mental status - alert, oriented to person, place, and time  Psych:  She has a normal mood and affect  Skin - warm and dry, normal color, no suspicious lesions noted  Chest - effort normal, all lung fields clear to auscultation bilaterally  Heart - normal rate and regular rhythm  Abdomen - soft, nontender  Extremities:  No swelling or varicosities noted  Thin prep pap is not done   TODAY'S FHT: unable to hear w/ doppler, informal TA u/s +FCA and active fetus  Results for orders placed or performed in visit on 04/21/20 (from the past 24 hour(s))  POC Urinalysis Dipstick OB   Collection Time: 04/21/20  3:03 PM  Result Value Ref Range   Color, UA     Clarity, UA     Glucose, UA Negative Negative   Bilirubin, UA     Ketones, UA neg    Spec Grav, UA     Blood, UA neg    pH, UA     POC,PROTEIN,UA Negative Negative, Trace, Small (1+), Moderate (2+), Large (3+), 4+  Urobilinogen, UA     Nitrite, UA neg    Leukocytes, UA Negative Negative   Appearance     Odor      Assessment & Plan:  1) Low-Risk Pregnancy G3P1011 at [redacted]w[redacted]d with an Estimated Date of Delivery: 10/28/20   2) Initial OB visit  3) Labile bp's at end of last pregnancy> no dx GHTN, go ahead and start ASA   Meds:  Meds ordered this encounter  Medications  . Doxylamine-Pyridoxine (DICLEGIS) 10-10 MG TBEC    Sig: 2 tabs q hs, if sx persist add 1 tab q am on day 3, if sx persist add 1 tab q afternoon on day 4    Dispense:  100 tablet    Refill:  6    Order Specific Question:   Supervising Provider    Answer:   Duane Lope H [[id number]]    Initial labs obtained Continue prenatal vitamins Reviewed n/v relief measures and warning s/s to report Reviewed recommended weight gain based on pre-gravid BMI Encouraged well-balanced  diet Genetic & carrier screening discussed: requests Panorama, NT/IT and Horizon 14  Ultrasound discussed; fetal survey: requested CCNC completed> form faxed if has or is planning to apply for medicaid The nature of [employer] for [employer] with multiple MDs and other Advanced Practice Providers was explained to patient; also emphasized that fellows, residents, and students are part of our team.  Follow-up: Return for As scheduled 10/5 for nt u/s and labs (no visit), then 3wks from now for 2nd IT and lrob w/ cnm.   Orders Placed This Encounter  Procedures  . GC/Chlamydia Probe Amp  . Urine Culture  . Genetic Screening  . CBC/D/Plt+RPR+Rh+ABO+Rub Ab...  . Pain Management Screening Profile (10S)  . POC Urinalysis Dipstick OB    Cheral Marker CNM, [facility] 04/21/2020 3:28 PM

## 2020-04-21 NOTE — Patient Instructions (Signed)
Leah Kennedy, I greatly value your feedback.  If you receive a survey following your visit with [place] today, we appreciate you taking the time to fill it out.  Thanks, Leah Kennedy, CNM, WHNP-BC   [facility] ([address] [place], [place] [address]) Entrance C, located off of [address] Free 24/7 valet parking   Nausea & Vomiting  Have saltine crackers or pretzels by your bed and eat a few bites before you raise your head out of bed in the morning  Eat small frequent meals throughout the day instead of large meals  Drink plenty of fluids throughout the day to stay hydrated, just don't drink a lot of fluids with your meals.  This can make your stomach fill up faster making you feel sick  Do not brush your teeth right after you eat  Products with real ginger are good for nausea, like ginger ale and ginger hard candy Make sure it says made with real ginger!  Sucking on sour candy like lemon heads is also good for nausea  If your prenatal vitamins make you nauseated, take them at night so you will sleep through the nausea  Sea Bands  If you feel like you need medicine for the nausea & vomiting please let us know  If you are unable to keep any fluids or food down please let us know   Constipation  Drink plenty of fluid, preferably water, throughout the day  Eat foods high in fiber such as fruits, vegetables, and grains  Exercise, such as walking, is a good way to keep your bowels regular  Drink warm fluids, especially warm prune juice, or decaf coffee  Eat a 1/2 cup of real oatmeal (not instant), 1/2 cup applesauce, and 1/2-1 cup warm prune juice every day  If needed, you may take Colace (docusate sodium) stool softener once or twice a day to help keep the stool soft.   If you still are having problems with constipation, you may take Miralax once daily as needed to help keep your bowels regular.   Home Blood Pressure Monitoring for Patients    Your provider has recommended that you check your blood pressure (BP) at least once a week at home. If you do not have a blood pressure cuff at home, one will be provided for you. Contact your provider if you have not received your monitor within 1 week.   Helpful Tips for Accurate Home Blood Pressure Checks   Don't smoke, exercise, or drink caffeine 30 minutes before checking your BP  Use the restroom before checking your BP (a full bladder can raise your pressure)  Relax in a comfortable upright chair  Feet on the ground  Left arm resting comfortably on a flat surface at the level of your heart  Legs uncrossed  Back supported  Sit quietly and don't talk  Place the cuff on your bare arm  Adjust snuggly, so that only two fingertips can fit between your skin and the top of the cuff  Check 2 readings separated by at least one minute  Keep a log of your BP readings  For a visual, please reference this diagram: http://ccnc.care/bpdiagram  Provider Name: [facility]     Phone: [phone]  Zone 1: ALL CLEAR  Continue to monitor your symptoms:   BP reading is less than 140 (top number) or less than 90 (bottom number)   No right upper stomach pain  No headaches or seeing  spots  No feeling nauseated or throwing up  No swelling in face and hands  Zone 2: CAUTION Call your doctor's office for any of the following:   BP reading is greater than 140 (top number) or greater than 90 (bottom number)   Stomach pain under your ribs in the middle or right side  Headaches or seeing spots  Feeling nauseated or throwing up  Swelling in face and hands  Zone 3: EMERGENCY  Seek immediate medical care if you have any of the following:   BP reading is greater than160 (top number) or greater than 110 (bottom number)  Severe headaches not improving with Tylenol  Serious difficulty catching your breath  Any worsening symptoms from Zone 2    First Trimester of  Pregnancy The first trimester of pregnancy is from week 1 until the end of week 12 (months 1 through 3). A week after a sperm fertilizes an egg, the egg will implant on the wall of the uterus. This embryo will begin to develop into a baby. Genes from you and your partner are forming the baby. The female genes determine whether the baby is a boy or a girl. At 6-8 weeks, the eyes and face are formed, and the heartbeat can be seen on ultrasound. At the end of 12 weeks, all the baby's organs are formed.  Now that you are pregnant, you will want to do everything you can to have a healthy baby. Two of the most important things are to get good prenatal care and to follow your health care provider's instructions. Prenatal care is all the medical care you receive before the baby's birth. This care will help prevent, find, and treat any problems during the pregnancy and childbirth. BODY CHANGES Your body goes through many changes during pregnancy. The changes vary from woman to woman.   You may gain or lose a couple of pounds at first.  You may feel sick to your stomach (nauseous) and throw up (vomit). If the vomiting is uncontrollable, call your health care provider.  You may tire easily.  You may develop headaches that can be relieved by medicines approved by your health care provider.  You may urinate more often. Painful urination may mean you have a bladder infection.  You may develop heartburn as a result of your pregnancy.  You may develop constipation because certain hormones are causing the muscles that push waste through your intestines to slow down.  You may develop hemorrhoids or swollen, bulging veins (varicose veins).  Your breasts may begin to grow larger and become tender. Your nipples may stick out more, and the tissue that surrounds them (areola) may become darker.  Your gums may bleed and may be sensitive to brushing and flossing.  Dark spots or blotches (chloasma, mask of pregnancy)  may develop on your face. This will likely fade after the baby is born.  Your menstrual periods will stop.  You may have a loss of appetite.  You may develop cravings for certain kinds of food.  You may have changes in your emotions from day to day, such as being excited to be pregnant or being concerned that something may go wrong with the pregnancy and baby.  You may have more vivid and strange dreams.  You may have changes in your hair. These can include thickening of your hair, rapid growth, and changes in texture. Some women also have hair loss during or after pregnancy, or hair that feels dry or thin. Your hair  will most likely return to normal after your baby is born. WHAT TO EXPECT AT YOUR PRENATAL VISITS During a routine prenatal visit:  You will be weighed to make sure you and the baby are growing normally.  Your blood pressure will be taken.  Your abdomen will be measured to track your baby's growth.  The fetal heartbeat will be listened to starting around week 10 or 12 of your pregnancy.  Test results from any previous visits will be discussed. Your health care provider may ask you:  How you are feeling.  If you are feeling the baby move.  If you have had any abnormal symptoms, such as leaking fluid, bleeding, severe headaches, or abdominal cramping.  If you have any questions. Other tests that may be performed during your first trimester include:  Blood tests to find your blood type and to check for the presence of any previous infections. They will also be used to check for low iron levels (anemia) and Rh antibodies. Later in the pregnancy, blood tests for diabetes will be done along with other tests if problems develop.  Urine tests to check for infections, diabetes, or protein in the urine.  An ultrasound to confirm the proper growth and development of the baby.  An amniocentesis to check for possible genetic problems.  Fetal screens for spina bifida and  Down syndrome.  You may need other tests to make sure you and the baby are doing well. HOME CARE INSTRUCTIONS  Medicines  Follow your health care provider's instructions regarding medicine use. Specific medicines may be either safe or unsafe to take during pregnancy.  Take your prenatal vitamins as directed.  If you develop constipation, try taking a stool softener if your health care provider approves. Diet  Eat regular, well-balanced meals. Choose a variety of foods, such as meat or vegetable-based protein, fish, milk and low-fat dairy products, vegetables, fruits, and whole grain breads and cereals. Your health care provider will help you determine the amount of weight gain that is right for you.  Avoid raw meat and uncooked cheese. These carry germs that can cause birth defects in the baby.  Eating four or five small meals rather than three large meals a day may help relieve nausea and vomiting. If you start to feel nauseous, eating a few soda crackers can be helpful. Drinking liquids between meals instead of during meals also seems to help nausea and vomiting.  If you develop constipation, eat more high-fiber foods, such as fresh vegetables or fruit and whole grains. Drink enough fluids to keep your urine clear or pale yellow. Activity and Exercise  Exercise only as directed by your health care provider. Exercising will help you:  Control your weight.  Stay in shape.  Be prepared for labor and delivery.  Experiencing pain or cramping in the lower abdomen or low back is a good sign that you should stop exercising. Check with your health care provider before continuing normal exercises.  Try to avoid standing for long periods of time. Move your legs often if you must stand in one place for a long time.  Avoid heavy lifting.  Wear low-heeled shoes, and practice good posture.  You may continue to have sex unless your health care provider directs you otherwise. Relief of Pain  or Discomfort  Wear a good support bra for breast tenderness.    Take warm sitz baths to soothe any pain or discomfort caused by hemorrhoids. Use hemorrhoid cream if your health care provider approves.  Rest with your legs elevated if you have leg cramps or low back pain.  If you develop varicose veins in your legs, wear support hose. Elevate your feet for 15 minutes, 3-4 times a day. Limit salt in your diet. Prenatal Care  Schedule your prenatal visits by the twelfth week of pregnancy. They are usually scheduled monthly at first, then more often in the last 2 months before delivery.  Write down your questions. Take them to your prenatal visits.  Keep all your prenatal visits as directed by your health care provider. Safety  Wear your seat belt at all times when driving.  Make a list of emergency phone numbers, including numbers for family, friends, the hospital, and police and fire departments. General Tips  Ask your health care provider for a referral to a local prenatal education class. Begin classes no later than at the beginning of month 6 of your pregnancy.  Ask for help if you have counseling or nutritional needs during pregnancy. Your health care provider can offer advice or refer you to specialists for help with various needs.  Do not use hot tubs, steam rooms, or saunas.  Do not douche or use tampons or scented sanitary pads.  Do not cross your legs for long periods of time.  Avoid cat litter boxes and soil used by cats. These carry germs that can cause birth defects in the baby and possibly loss of the fetus by miscarriage or stillbirth.  Avoid all smoking, herbs, alcohol, and medicines not prescribed by your health care provider. Chemicals in these affect the formation and growth of the baby.  Schedule a dentist appointment. At home, brush your teeth with a soft toothbrush and be gentle when you floss. SEEK MEDICAL CARE IF:   You have dizziness.  You have mild  pelvic cramps, pelvic pressure, or nagging pain in the abdominal area.  You have persistent nausea, vomiting, or diarrhea.  You have a bad smelling vaginal discharge.  You have pain with urination.  You notice increased swelling in your face, hands, legs, or ankles. SEEK IMMEDIATE MEDICAL CARE IF:   You have a fever.  You are leaking fluid from your vagina.  You have spotting or bleeding from your vagina.  You have severe abdominal cramping or pain.  You have rapid weight gain or loss.  You vomit blood or material that looks like coffee grounds.  You are exposed to [place] measles and have never had them.  You are exposed to fifth disease or chickenpox.  You develop a severe headache.  You have shortness of breath.  You have any kind of trauma, such as from a fall or a car accident. Document Released: 07/04/2001 Document Revised: 11/24/2013 Document Reviewed: 05/20/2013 [facility] Patient Information 2015 [place], [place]. This information is not intended to replace advice given to you by your health care provider. Make sure you discuss any questions you have with your health care provider.

## 2020-04-23 LAB — URINE CULTURE

## 2020-04-23 LAB — PMP SCREEN PROFILE (10S), URINE
Amphetamine Scrn, Ur: NEGATIVE ng/mL
BARBITURATE SCREEN URINE: NEGATIVE ng/mL
BENZODIAZEPINE SCREEN, URINE: NEGATIVE ng/mL
CANNABINOIDS UR QL SCN: POSITIVE ng/mL — AB
Cocaine (Metab) Scrn, Ur: NEGATIVE ng/mL
Creatinine(Crt), U: 260.4 mg/dL (ref 20.0–300.0)
Methadone Screen, Urine: NEGATIVE ng/mL
OXYCODONE+OXYMORPHONE UR QL SCN: NEGATIVE ng/mL
Opiate Scrn, Ur: NEGATIVE ng/mL
Ph of Urine: 7.1 (ref 4.5–8.9)
Phencyclidine Qn, Ur: NEGATIVE ng/mL
Propoxyphene Scrn, Ur: NEGATIVE ng/mL

## 2020-04-23 LAB — GC/CHLAMYDIA PROBE AMP
Chlamydia trachomatis, NAA: POSITIVE — AB
Neisseria Gonorrhoeae by PCR: NEGATIVE

## 2020-04-27 ENCOUNTER — Other Ambulatory Visit: Payer: Medicaid Other

## 2020-04-27 ENCOUNTER — Encounter: Payer: Self-pay | Admitting: Women's Health

## 2020-04-27 ENCOUNTER — Other Ambulatory Visit: Payer: Self-pay | Admitting: Women's Health

## 2020-04-27 ENCOUNTER — Ambulatory Visit (INDEPENDENT_AMBULATORY_CARE_PROVIDER_SITE_OTHER): Payer: Medicaid Other

## 2020-04-27 DIAGNOSIS — Z3682 Encounter for antenatal screening for nuchal translucency: Secondary | ICD-10-CM | POA: Diagnosis not present

## 2020-04-27 DIAGNOSIS — A749 Chlamydial infection, unspecified: Secondary | ICD-10-CM

## 2020-04-27 DIAGNOSIS — Z1379 Encounter for other screening for genetic and chromosomal anomalies: Secondary | ICD-10-CM

## 2020-04-27 DIAGNOSIS — F1290 Cannabis use, unspecified, uncomplicated: Secondary | ICD-10-CM | POA: Insufficient documentation

## 2020-04-27 HISTORY — DX: Cannabis use, unspecified, uncomplicated: F12.90

## 2020-04-27 MED ORDER — AZITHROMYCIN 500 MG PO TABS: 1000.0000 mg | ORAL_TABLET | Freq: Once | ORAL | 0 refills | Status: AC

## 2020-04-27 NOTE — Progress Notes (Signed)
[place] 13+5 wks,measurements c/w dates,retroverted uterus,simple right adnexal cyst n/c 2.2 x 1.6 x 2.5 cm,normal ovaries,crl 78.51 mm,fhr 158 bpm,NT 1.9 mm,NB present,

## 2020-04-29 LAB — INTEGRATED 1
Crown Rump Length: 78.5 mm
Gest. Age on Collection Date: 13.6 weeks
Maternal Age at EDD: 22.6 yr
Nuchal Translucency (NT): 1.9 mm
Number of Fetuses: 1
PAPP-A Value: 253.1 ng/mL
Weight: 292 [lb_av]

## 2020-05-10 ENCOUNTER — Telehealth: Payer: Self-pay | Admitting: Obstetrics & Gynecology

## 2020-05-10 MED ORDER — AZITHROMYCIN 500 MG PO TABS: 1000.0000 mg | ORAL_TABLET | Freq: Once | ORAL | 0 refills | Status: AC

## 2020-05-10 NOTE — Telephone Encounter (Signed)
Pt states she's been the pharmacy a couple times & the antibiotic we were going to order has not been sent in  See note below & please advise & call pt  Vangie Bicker, Clovis Fredrickson, RN to [place], [place]     05/03/20 2:23 PM You're welcome.   Last read by Konrad Saha at 4:54 PM on 05/03/2020. Konrad Saha to Cheral Marker, [place]     05/03/20 1:36 PM Okay I will pick it up asap. Thank you Cresenzo, Clovis Fredrickson, RN to Jakita, [place]     05/03/20 1:36 PM It looks like the antibiotic was sent to the [facility].  If you use a different [facility], it can be transferred.   Last read by Konrad Saha at 4:54 PM on 05/03/2020. Konrad Saha to Cheral Marker, [place]     05/03/20 1:26 PM I was wondering when can the antibiotics for chlamydia be sent? For me and my partner. Leah Kennedy 10/06/1993. No allergies. When I picked up my new nausea medicine on the 5th it wasnt in their system.

## 2020-05-10 NOTE — Addendum Note (Signed)
Addended by: Annamarie Dawley on: 05/10/2020 04:41 PM   Modules accepted: Orders

## 2020-05-18 ENCOUNTER — Encounter: Payer: Self-pay | Admitting: Women's Health

## 2020-05-18 ENCOUNTER — Other Ambulatory Visit: Payer: Self-pay

## 2020-05-18 ENCOUNTER — Ambulatory Visit (INDEPENDENT_AMBULATORY_CARE_PROVIDER_SITE_OTHER): Payer: Medicaid Other | Admitting: Women's Health

## 2020-05-18 VITALS — BP 124/73 | HR 83 | Wt 292.2 lb

## 2020-05-18 DIAGNOSIS — Z3480 Encounter for supervision of other normal pregnancy, unspecified trimester: Secondary | ICD-10-CM

## 2020-05-18 DIAGNOSIS — Z3A12 12 weeks gestation of pregnancy: Secondary | ICD-10-CM | POA: Diagnosis not present

## 2020-05-18 DIAGNOSIS — Z3A16 16 weeks gestation of pregnancy: Secondary | ICD-10-CM

## 2020-05-18 DIAGNOSIS — A749 Chlamydial infection, unspecified: Secondary | ICD-10-CM

## 2020-05-18 DIAGNOSIS — Z331 Pregnant state, incidental: Secondary | ICD-10-CM

## 2020-05-18 DIAGNOSIS — Z3482 Encounter for supervision of other normal pregnancy, second trimester: Secondary | ICD-10-CM

## 2020-05-18 DIAGNOSIS — Z1379 Encounter for other screening for genetic and chromosomal anomalies: Secondary | ICD-10-CM | POA: Diagnosis not present

## 2020-05-18 DIAGNOSIS — Z363 Encounter for antenatal screening for malformations: Secondary | ICD-10-CM

## 2020-05-18 DIAGNOSIS — Z1389 Encounter for screening for other disorder: Secondary | ICD-10-CM

## 2020-05-18 LAB — POCT URINALYSIS DIPSTICK OB
Blood, UA: NEGATIVE
Glucose, UA: NEGATIVE
Nitrite, UA: NEGATIVE
POC,PROTEIN,UA: NEGATIVE

## 2020-05-18 MED ORDER — PANTOPRAZOLE SODIUM 20 MG PO TBEC: 20.0000 mg | DELAYED_RELEASE_TABLET | Freq: Every day | ORAL | 6 refills | Status: DC

## 2020-05-18 MED ORDER — AZITHROMYCIN 500 MG PO TABS: 1000.0000 mg | ORAL_TABLET | Freq: Once | ORAL | 0 refills | Status: AC

## 2020-05-18 NOTE — Patient Instructions (Signed)
Leah Kennedy, I greatly value your feedback.  If you receive a survey following your visit with [place] today, we appreciate you taking the time to fill it out.  Thanks, Leah Kennedy, CNM, WHNP-BC  [facility] ([address] [place], [place] [address]) Entrance C, located off of [address] valet parking  Go to [email or web address] to register for FREE online childbirth classes  [place] Pediatricians/Family Doctors:  [facility] [phone]            [facility] [phone]                 [facility] [phone] (usually not accepting new patients unless you have family there already, you are always welcome to call and ask)       [facility] Department [phone]       [facility] Pediatricians/Family Doctors:   [facility]: [phone]  [facility]: [phone]  [facility]: [phone]  [facility] Doctors:   [facility]: [phone]   [facility]: [phone]  [facility] Doctors:  [facility]: [phone]    Home Blood Pressure Monitoring for Patients   Your provider has recommended that you check your blood pressure (BP) at least once a week at home. If you do not have a blood pressure cuff at home, one will be provided for you. Contact your provider if you have not received your monitor within 1 week.   Helpful Tips for Accurate Home Blood Pressure Checks  . Don't smoke, exercise, or drink caffeine 30 minutes before checking your BP . Use the restroom before checking your BP (a full bladder can raise your pressure) . Relax in a comfortable upright chair . Feet on the ground . Left arm resting comfortably on a flat surface at the level of your heart . Legs uncrossed . Back supported . Sit quietly and don't talk . Place the cuff on your bare arm . Adjust snuggly, so  that only two fingertips can fit between your skin and the top of the cuff . Check 2 readings separated by at least one minute . Keep a log of your BP readings . For a visual, please reference this diagram: http://ccnc.care/bpdiagram  Provider Name: [facility]     Phone: [phone]  Zone 1: ALL CLEAR  Continue to monitor your symptoms:  . BP reading is less than 140 (top number) or less than 90 (bottom number)  . No right upper stomach pain . No headaches or seeing spots . No feeling nauseated or throwing up . No swelling in face and hands  Zone 2: CAUTION Call your doctor's office for any of the following:  . BP reading is greater than 140 (top number) or greater than 90 (bottom number)  . Stomach pain under your ribs in the middle or right side . Headaches or seeing spots . Feeling nauseated or throwing up . Swelling in face and hands  Zone 3: EMERGENCY  Seek immediate medical care if you have any of the following:  . BP reading is greater than160 (top number) or greater than 110 (bottom number) . Severe headaches not improving with Tylenol . Serious difficulty catching your breath . Any worsening symptoms from Zone 2     Second Trimester of Pregnancy The second trimester is from week 14 through week 27 (months 4 through 6). The second trimester is often a time when you feel your best.  Your body has adjusted to being pregnant, and you begin to feel better physically. Usually, morning sickness has lessened or quit completely, you may have more energy, and you may have an increase in appetite. The second trimester is also a time when the fetus is growing rapidly. At the end of the sixth month, the fetus is about 9 inches long and weighs about 1 pounds. You will likely begin to feel the baby move (quickening) between 16 and 20 weeks of pregnancy. Body changes during your second trimester Your body continues to go through many changes during your second trimester. The  changes vary from woman to woman.  Your weight will continue to increase. You will notice your lower abdomen bulging out.  You may begin to get stretch marks on your hips, abdomen, and breasts.  You may develop headaches that can be relieved by medicines. The medicines should be approved by your health care provider.  You may urinate more often because the fetus is pressing on your bladder.  You may develop or continue to have heartburn as a result of your pregnancy.  You may develop constipation because certain hormones are causing the muscles that push waste through your intestines to slow down.  You may develop hemorrhoids or swollen, bulging veins (varicose veins).  You may have back pain. This is caused by: ? Weight gain. ? Pregnancy hormones that are relaxing the joints in your pelvis. ? A shift in weight and the muscles that support your balance.  Your breasts will continue to grow and they will continue to become tender.  Your gums may bleed and may be sensitive to brushing and flossing.  Dark spots or blotches (chloasma, mask of pregnancy) may develop on your face. This will likely fade after the baby is born.  A dark line from your belly button to the pubic area (linea nigra) may appear. This will likely fade after the baby is born.  You may have changes in your hair. These can include thickening of your hair, rapid growth, and changes in texture. Some women also have hair loss during or after pregnancy, or hair that feels dry or thin. Your hair will most likely return to normal after your baby is born.  What to expect at prenatal visits During a routine prenatal visit:  You will be weighed to make sure you and the fetus are growing normally.  Your blood pressure will be taken.  Your abdomen will be measured to track your baby's growth.  The fetal heartbeat will be listened to.  Any test results from the previous visit will be discussed.  Your health care  provider may ask you:  How you are feeling.  If you are feeling the baby move.  If you have had any abnormal symptoms, such as leaking fluid, bleeding, severe headaches, or abdominal cramping.  If you are using any tobacco products, including cigarettes, chewing tobacco, and electronic cigarettes.  If you have any questions.  Other tests that may be performed during your second trimester include:  Blood tests that check for: ? Low iron levels (anemia). ? High blood sugar that affects pregnant women (gestational diabetes) between 64 and 28 weeks. ? Rh antibodies. This is to check for a protein on red blood cells (Rh factor).  Urine tests to check for infections, diabetes, or protein in the urine.  An ultrasound to confirm the proper growth and development of the baby.  An amniocentesis to check for possible genetic problems.  Fetal screens  for spina bifida and Down syndrome.  HIV (human immunodeficiency virus) testing. Routine prenatal testing includes screening for HIV, unless you choose not to have this test.  Follow these instructions at home: Medicines  Follow your health care provider's instructions regarding medicine use. Specific medicines may be either safe or unsafe to take during pregnancy.  Take a prenatal vitamin that contains at least 600 micrograms (mcg) of folic acid.  If you develop constipation, try taking a stool softener if your health care provider approves. Eating and drinking  Eat a balanced diet that includes fresh fruits and vegetables, whole grains, good sources of protein such as meat, eggs, or tofu, and low-fat dairy. Your health care provider will help you determine the amount of weight gain that is right for you.  Avoid raw meat and uncooked cheese. These carry germs that can cause birth defects in the baby.  If you have low calcium intake from food, talk to your health care provider about whether you should take a daily calcium  supplement.  Limit foods that are high in fat and processed sugars, such as fried and sweet foods.  To prevent constipation: ? Drink enough fluid to keep your urine clear or pale yellow. ? Eat foods that are high in fiber, such as fresh fruits and vegetables, whole grains, and beans. Activity  Exercise only as directed by your health care provider. Most women can continue their usual exercise routine during pregnancy. Try to exercise for 30 minutes at least 5 days a week. Stop exercising if you experience uterine contractions.  Avoid heavy lifting, wear low heel shoes, and practice good posture.  A sexual relationship may be continued unless your health care provider directs you otherwise. Relieving pain and discomfort  Wear a good support bra to prevent discomfort from breast tenderness.  Take warm sitz baths to soothe any pain or discomfort caused by hemorrhoids. Use hemorrhoid cream if your health care provider approves.  Rest with your legs elevated if you have leg cramps or low back pain.  If you develop varicose veins, wear support hose. Elevate your feet for 15 minutes, 3-4 times a day. Limit salt in your diet. Prenatal Care  Write down your questions. Take them to your prenatal visits.  Keep all your prenatal visits as told by your health care provider. This is important. Safety  Wear your seat belt at all times when driving.  Make a list of emergency phone numbers, including numbers for family, friends, the hospital, and police and fire departments. General instructions  Ask your health care provider for a referral to a local prenatal education class. Begin classes no later than the beginning of month 6 of your pregnancy.  Ask for help if you have counseling or nutritional needs during pregnancy. Your health care provider can offer advice or refer you to specialists for help with various needs.  Do not use hot tubs, steam rooms, or saunas.  Do not douche or use  tampons or scented sanitary pads.  Do not cross your legs for long periods of time.  Avoid cat litter boxes and soil used by cats. These carry germs that can cause birth defects in the baby and possibly loss of the fetus by miscarriage or stillbirth.  Avoid all smoking, herbs, alcohol, and unprescribed drugs. Chemicals in these products can affect the formation and growth of the baby.  Do not use any products that contain nicotine or tobacco, such as cigarettes and e-cigarettes. If you need help quitting,  ask your health care provider.  Visit your dentist if you have not gone yet during your pregnancy. Use a soft toothbrush to brush your teeth and be gentle when you floss. Contact a health care provider if:  You have dizziness.  You have mild pelvic cramps, pelvic pressure, or nagging pain in the abdominal area.  You have persistent nausea, vomiting, or diarrhea.  You have a bad smelling vaginal discharge.  You have pain when you urinate. Get help right away if:  You have a fever.  You are leaking fluid from your vagina.  You have spotting or bleeding from your vagina.  You have severe abdominal cramping or pain.  You have rapid weight gain or weight loss.  You have shortness of breath with chest pain.  You notice sudden or extreme swelling of your face, hands, ankles, feet, or legs.  You have not felt your baby move in over an hour.  You have severe headaches that do not go away when you take medicine.  You have vision changes. Summary  The second trimester is from week 14 through week 27 (months 4 through 6). It is also a time when the fetus is growing rapidly.  Your body goes through many changes during pregnancy. The changes vary from woman to woman.  Avoid all smoking, herbs, alcohol, and unprescribed drugs. These chemicals affect the formation and growth your baby.  Do not use any tobacco products, such as cigarettes, chewing tobacco, and e-cigarettes. If you  need help quitting, ask your health care provider.  Contact your health care provider if you have any questions. Keep all prenatal visits as told by your health care provider. This is important. This information is not intended to replace advice given to you by your health care provider. Make sure you discuss any questions you have with your health care provider. Document Released: 07/04/2001 Document Revised: 12/16/2015 Document Reviewed: 09/10/2012 Elsevier Interactive Patient Education  2017 [employer].

## 2020-05-18 NOTE — Progress Notes (Signed)
LOW-RISK PREGNANCY VISIT Patient name: Leah Kennedy MRN [id number]  Date of birth: [birth year] Chief Complaint:   Routine Prenatal Visit  History of Present Illness:   Leah Kennedy is a 24 y.o. G25P1011 female at [redacted]w[redacted]d with an Estimated Date of Delivery: 10/28/20 being seen today for ongoing management of a low-risk pregnancy.  Depression screen [facility] 2/9 04/21/2020 11/19/2019 07/01/2018  Decreased Interest 0 2 1  Down, Depressed, Hopeless 0 0 0  PHQ - 2 Score 0 2 1  Altered sleeping 2 1 2   Tired, decreased energy 2 2 1   Change in appetite 2 2 1   Feeling bad or failure about yourself  0 1 0  Trouble concentrating 0 0 0  Moving slowly or fidgety/restless 0 0 0  Suicidal thoughts 0 0 0  PHQ-9 Score 6 8 5   Difficult doing work/chores - Not difficult at all -    Today she reports states she has tried to pick up azithromycin for +CT multiple times, but they say they don't have anything for her or partner. Rx has been sent twice. Reports heartburn, wants rx. Interested in . Contractions: Not present. Vag. Bleeding: None.  Movement: Present. denies leaking of fluid. Review of Systems:   Pertinent items are noted in HPI Denies abnormal vaginal discharge w/ itching/odor/irritation, headaches, visual changes, shortness of breath, chest pain, abdominal pain, severe nausea/vomiting, or problems with urination or bowel movements unless otherwise stated above. Pertinent History Reviewed:  Reviewed past medical,surgical, social, obstetrical and family history.  Reviewed problem list, medications and allergies. Physical Assessment:   Vitals:   05/18/20 1046  BP: 124/73  Pulse: 83  Weight: 292 lb 3.2 oz (132.5 kg)  Body mass index is 46.46 kg/m.        Physical Examination:   General appearance: Well appearing, and in no distress  Mental status: Alert, oriented to person, place, and time  Skin: Warm & dry  Cardiovascular: Normal heart rate noted  Respiratory: Normal respiratory  effort, no distress  Abdomen: Soft, gravid, nontender  Pelvic: Cervical exam deferred         Extremities:    Fetal Status:     Movement: Present    Chaperone: N/A   Results for orders placed or performed in visit on 05/18/20 (from the past 24 hour(s))  POC Urinalysis Dipstick OB   Collection Time: 05/18/20 10:52 AM  Result Value Ref Range   Color, UA     Clarity, UA     Glucose, UA Negative Negative   Bilirubin, UA     Ketones, UA Trace    Spec Grav, UA     Blood, UA Negative    pH, UA     POC,PROTEIN,UA Negative Negative, Trace, Small (1+), Moderate (2+), Large (3+), 4+   Urobilinogen, UA     Nitrite, UA Neg    Leukocytes, UA Trace (A) Negative   Appearance     Odor      Assessment & Plan:  1) Low-risk pregnancy G3P1011 at [redacted]w[redacted]d with an Estimated Date of Delivery: 10/28/20   2) +CT, hasn't taken meds yet, rx sent to [facility] for pt and called in for partner 05/20/20, DOB 10/06/95, NKDA today. No sex x at least 7d from both finishing meds. POC in 3-4wks  3) Interested in waterbirth   Meds:  Meds ordered this encounter  Medications  . azithromycin (ZITHROMAX) 500 MG tablet    Sig: Take 2 tablets (1,000 mg total) by mouth once for 1 dose.  Dispense:  2 tablet    Refill:  0    Order Specific Question:   Supervising Provider    Answer:   EURE, LUTHER H [[id number]]  . pantoprazole (PROTONIX) 20 MG tablet    Sig: Take 1 tablet (20 mg total) by mouth daily.    Dispense:  30 tablet    Refill:  6    Order Specific Question:   Supervising Provider    Answer:   Lazaro Arms [[id number]]   Labs/procedures today: 2nd IT, PN1  Plan:  Continue routine obstetrical care  Next visit: prefers will be in person for anatomy u/s    Reviewed: Preterm labor symptoms and general obstetric precautions including but not limited to vaginal bleeding, contractions, leaking of fluid and fetal movement were reviewed in detail with the patient.  All questions were answered. Has home bp cuff.   Check bp weekly, let us know if >140/90.   Follow-up: Return in about 2 weeks (around 06/01/2020) for LROB, [id number], in person, CNM.  Orders Placed This Encounter  Procedures  . US OB Comp + 14 Wk  . INTEGRATED 2  . POC Urinalysis Dipstick OB   Cheral Marker CNM, [facility] 05/18/2020 11:28 AM

## 2020-05-19 LAB — CBC/D/PLT+RPR+RH+ABO+RUB AB...
Antibody Screen: NEGATIVE
Basophils Absolute: 0 10*3/uL (ref 0.0–0.2)
Basos: 0 %
EOS (ABSOLUTE): 0.2 10*3/uL (ref 0.0–0.4)
Eos: 3 %
HCV Ab: <0.1 s/co ratio (ref 0.0–0.9)
HIV Screen 4th Generation wRfx: NONREACTIVE
Hematocrit: 34 % (ref 34.0–46.6)
Hemoglobin: 11.2 g/dL (ref 11.1–15.9)
Hepatitis B Surface Ag: NEGATIVE
Immature Grans (Abs): 0 10*3/uL (ref 0.0–0.1)
Immature Granulocytes: 0 %
Lymphocytes Absolute: 2 10*3/uL (ref 0.7–3.1)
Lymphs: 24 %
MCH: 28.4 pg (ref 26.6–33.0)
MCHC: 32.9 g/dL (ref 31.5–35.7)
MCV: 86 fL (ref 79–97)
Monocytes Absolute: 0.6 10*3/uL (ref 0.1–0.9)
Monocytes: 7 %
Neutrophils Absolute: 5.5 10*3/uL (ref 1.4–7.0)
Neutrophils: 66 %
Platelets: 274 10*3/uL (ref 150–450)
RBC: 3.95 x10E6/uL (ref 3.77–5.28)
RDW: 13.6 % (ref 11.7–15.4)
RPR Ser Ql: NONREACTIVE
Rh Factor: POSITIVE
Rubella Antibodies, IGG: <0.9 index — ABNORMAL LOW (ref >0.99)
WBC: 8.3 10*3/uL (ref 3.4–10.8)

## 2020-05-19 LAB — HCV INTERPRETATION

## 2020-05-20 ENCOUNTER — Telehealth: Payer: Self-pay | Admitting: Obstetrics & Gynecology

## 2020-05-20 ENCOUNTER — Other Ambulatory Visit: Payer: Self-pay | Admitting: Advanced Practice Midwife

## 2020-05-20 LAB — INTEGRATED 2
AFP MoM: 1.28
Alpha-Fetoprotein: 26.2 ng/mL
Crown Rump Length: 78.5 mm
DIA MoM: 0.65
DIA Value: 71.6 pg/mL
Estriol, Unconjugated: 1.29 ng/mL
Gest. Age on Collection Date: 13.6 weeks
Gestational Age: 16.6 weeks
Maternal Age at EDD: 22.6 yr
Nuchal Translucency (NT): 1.9 mm
Nuchal Translucency MoM: 1.03
Number of Fetuses: 1
PAPP-A MoM: 0.4
PAPP-A Value: 253.1 ng/mL
Test Results:: NEGATIVE
Weight: 292 [lb_av]
Weight: 292 [lb_av]
hCG MoM: 1.34
hCG Value: 25 IU/mL
uE3 MoM: 1.54

## 2020-05-20 MED ORDER — LORATADINE 10 MG PO TABS: 10.0000 mg | ORAL_TABLET | Freq: Every day | ORAL | 3 refills | Status: DC | PRN

## 2020-05-20 NOTE — Progress Notes (Signed)
claritin for allergies

## 2020-05-20 NOTE — Telephone Encounter (Signed)
Patient uses [facility] in [place] and would like allergy meds sent to pharmacy.

## 2020-05-20 NOTE — Telephone Encounter (Signed)
Claritin rx sent (allergy meds are OTC so medicaid may not cover_

## 2020-05-20 NOTE — Telephone Encounter (Signed)
Pt aware Claritin was sent to pharmacy but Medicaid may not pay since it's OTC. JSY

## 2020-05-21 ENCOUNTER — Encounter: Payer: Self-pay | Admitting: Women's Health

## 2020-05-21 DIAGNOSIS — O09899 Supervision of other high risk pregnancies, unspecified trimester: Secondary | ICD-10-CM | POA: Insufficient documentation

## 2020-05-21 DIAGNOSIS — Z2839 Other underimmunization status: Secondary | ICD-10-CM | POA: Insufficient documentation

## 2020-05-21 DIAGNOSIS — Z283 Underimmunization status: Secondary | ICD-10-CM | POA: Insufficient documentation

## 2020-06-02 ENCOUNTER — Encounter: Payer: Self-pay | Admitting: *Deleted

## 2020-06-02 DIAGNOSIS — Z3480 Encounter for supervision of other normal pregnancy, unspecified trimester: Secondary | ICD-10-CM

## 2020-06-07 ENCOUNTER — Encounter: Payer: Medicaid Other | Admitting: Obstetrics & Gynecology

## 2020-06-07 ENCOUNTER — Encounter: Payer: Self-pay | Admitting: Obstetrics & Gynecology

## 2020-06-07 ENCOUNTER — Ambulatory Visit (INDEPENDENT_AMBULATORY_CARE_PROVIDER_SITE_OTHER): Payer: Medicaid Other

## 2020-06-07 ENCOUNTER — Ambulatory Visit (INDEPENDENT_AMBULATORY_CARE_PROVIDER_SITE_OTHER): Payer: Medicaid Other | Admitting: Obstetrics & Gynecology

## 2020-06-07 ENCOUNTER — Other Ambulatory Visit: Payer: Self-pay

## 2020-06-07 VITALS — BP 110/64 | HR 66 | Wt 293.5 lb

## 2020-06-07 DIAGNOSIS — Z363 Encounter for antenatal screening for malformations: Secondary | ICD-10-CM | POA: Diagnosis not present

## 2020-06-07 DIAGNOSIS — Z23 Encounter for immunization: Secondary | ICD-10-CM

## 2020-06-07 DIAGNOSIS — Z3480 Encounter for supervision of other normal pregnancy, unspecified trimester: Secondary | ICD-10-CM

## 2020-06-07 DIAGNOSIS — O98812 Other maternal infectious and parasitic diseases complicating pregnancy, second trimester: Secondary | ICD-10-CM

## 2020-06-07 DIAGNOSIS — Z3482 Encounter for supervision of other normal pregnancy, second trimester: Secondary | ICD-10-CM | POA: Diagnosis not present

## 2020-06-07 DIAGNOSIS — A749 Chlamydial infection, unspecified: Secondary | ICD-10-CM

## 2020-06-07 DIAGNOSIS — Z3A19 19 weeks gestation of pregnancy: Secondary | ICD-10-CM

## 2020-06-07 DIAGNOSIS — Z1389 Encounter for screening for other disorder: Secondary | ICD-10-CM

## 2020-06-07 DIAGNOSIS — Z331 Pregnant state, incidental: Secondary | ICD-10-CM

## 2020-06-07 LAB — POCT URINALYSIS DIPSTICK OB
Blood, UA: NEGATIVE
Glucose, UA: NEGATIVE
Ketones, UA: NEGATIVE
Nitrite, UA: NEGATIVE
POC,PROTEIN,UA: NEGATIVE

## 2020-06-07 NOTE — Progress Notes (Signed)
[place] 19+4 wks,breech,cx 3.5 cm,anterior placenta gr 0,SVP 3.9 cm,normal right ovary,left ovary not visualized,FHR 135 bpm,EFW 299 g 43%,anatomy complete,no obvious abnormalities

## 2020-06-07 NOTE — Progress Notes (Signed)
[name], female   DOB: 05-18-1996, 24 y.o.   MRN: [id number]   LOW-RISK PREGNANCY VISIT Patient name: Leah Kennedy MRN [id number]  Date of birth: 04-Dec-1995 Chief Complaint:   Routine Prenatal Visit ([place] today)  History of Present Illness:   Leah Kennedy is a 24 y.o. G67P1011 female at [redacted]w[redacted]d with an Estimated Date of Delivery: 10/28/20 being seen today for ongoing management of a low-risk pregnancy.  Depression screen [facility] 2/9 04/21/2020 11/19/2019 07/01/2018  Decreased Interest 0 2 1  Down, Depressed, Hopeless 0 0 0  PHQ - 2 Score 0 2 1  Altered sleeping 2 1 2   Tired, decreased energy 2 2 1   Change in appetite 2 2 1   Feeling bad or failure about yourself  0 1 0  Trouble concentrating 0 0 0  Moving slowly or fidgety/restless 0 0 0  Suicidal thoughts 0 0 0  PHQ-9 Score 6 8 5   Difficult doing work/chores - Not difficult at all -    Today she reports no complaints. Contractions: Not present. Vag. Bleeding: None.  Movement: Present. denies leaking of fluid. Review of Systems:   Pertinent items are noted in HPI Denies abnormal vaginal discharge w/ itching/odor/irritation, headaches, visual changes, shortness of breath, chest pain, abdominal pain, severe nausea/vomiting, or problems with urination or bowel movements unless otherwise stated above. Pertinent History Reviewed:  Reviewed past medical,surgical, social, obstetrical and family history.  Reviewed problem list, medications and allergies. Physical Assessment:   Vitals:   06/07/20 1141  BP: 110/64  Pulse: 66  Weight: 293 lb 8 oz (133.1 kg)  Body mass index is 46.66 kg/m.        Physical Examination:   General appearance: Well appearing, and in no distress  Mental status: Alert, oriented to person, place, and time  Skin: Warm & dry  Cardiovascular: Normal heart rate noted  Respiratory: Normal respiratory effort, no distress  Abdomen: Soft, gravid, nontender  Pelvic: Cervical exam deferred          Extremities: Edema: Trace  Fetal Status:     Movement: Present    Chaperone: n/a    Results for orders placed or performed in visit on 06/07/20 (from the past 24 hour(s))  POC Urinalysis Dipstick OB   Collection Time: 06/07/20 11:42 AM  Result Value Ref Range   Color, UA     Clarity, UA     Glucose, UA Negative Negative   Bilirubin, UA     Ketones, UA neg    Spec Grav, UA     Blood, UA neg    pH, UA     POC,PROTEIN,UA Negative Negative, Trace, Small (1+), Moderate (2+), Large (3+), 4+   Urobilinogen, UA     Nitrite, UA neg    Leukocytes, UA Moderate (2+) (A) Negative   Appearance     Odor      Assessment & Plan:  1) Low-risk pregnancy G3P1011 at 109w4d with an Estimated Date of Delivery: 10/28/20   2) Normal sonogram,    Meds: No orders of the defined types were placed in this encounter.  Labs/procedures today:   Plan:  Continue routine obstetrical care  Next visit: prefers in person    Reviewed: Preterm labor symptoms and general obstetric precautions including but not limited to vaginal bleeding, contractions, leaking of fluid and fetal movement were reviewed in detail with the patient.  All questions were answered.  home bp cuff. Rx faxed to  Check bp weekly, let 06/09/20 know if >140/90.  Follow-up: No follow-ups on file.  Orders Placed This Encounter  Procedures  . GC/Chlamydia Probe Amp  . Flu Vaccine QUAD 36+ mos IM  . POC Urinalysis Dipstick OB    Lazaro Arms, MD 06/07/2020 12:23 PM

## 2020-06-09 LAB — GC/CHLAMYDIA PROBE AMP
Chlamydia trachomatis, NAA: NEGATIVE
Neisseria Gonorrhoeae by PCR: NEGATIVE

## 2020-06-24 DIAGNOSIS — Z029 Encounter for administrative examinations, unspecified: Secondary | ICD-10-CM

## 2020-07-05 ENCOUNTER — Encounter: Payer: Self-pay | Admitting: Obstetrics & Gynecology

## 2020-07-05 ENCOUNTER — Other Ambulatory Visit: Payer: Self-pay

## 2020-07-05 ENCOUNTER — Ambulatory Visit (INDEPENDENT_AMBULATORY_CARE_PROVIDER_SITE_OTHER): Payer: Medicaid Other | Admitting: Obstetrics & Gynecology

## 2020-07-05 VITALS — BP 125/75 | HR 88 | Wt 290.0 lb

## 2020-07-05 DIAGNOSIS — Z1389 Encounter for screening for other disorder: Secondary | ICD-10-CM

## 2020-07-05 DIAGNOSIS — Z331 Pregnant state, incidental: Secondary | ICD-10-CM

## 2020-07-05 DIAGNOSIS — Z3A23 23 weeks gestation of pregnancy: Secondary | ICD-10-CM

## 2020-07-05 DIAGNOSIS — Z3482 Encounter for supervision of other normal pregnancy, second trimester: Secondary | ICD-10-CM

## 2020-07-05 NOTE — Progress Notes (Signed)
   LOW-RISK PREGNANCY VISIT Patient name: Leah Kennedy MRN [id number]  Date of birth: 1996/04/15 Chief Complaint:   Routine Prenatal Visit  History of Present Illness:   Leah Kennedy is a 24 y.o. G22P1011 female at [redacted]w[redacted]d with an Estimated Date of Delivery: 10/28/20 being seen today for ongoing management of a low-risk pregnancy.  Depression screen [facility] 2/9 04/21/2020 11/19/2019 07/01/2018  Decreased Interest 0 2 1  Down, Depressed, Hopeless 0 0 0  PHQ - 2 Score 0 2 1  Altered sleeping 2 1 2   Tired, decreased energy 2 2 1   Change in appetite 2 2 1   Feeling bad or failure about yourself  0 1 0  Trouble concentrating 0 0 0  Moving slowly or fidgety/restless 0 0 0  Suicidal thoughts 0 0 0  PHQ-9 Score 6 8 5   Difficult doing work/chores - Not difficult at all -    Today she reports no complaints. Contractions: Not present. Vag. Bleeding: None.  Movement: Present. denies leaking of fluid. Review of Systems:   Pertinent items are noted in HPI Denies abnormal vaginal discharge w/ itching/odor/irritation, headaches, visual changes, shortness of breath, chest pain, abdominal pain, severe nausea/vomiting, or problems with urination or bowel movements unless otherwise stated above. Pertinent History Reviewed:  Reviewed past medical,surgical, social, obstetrical and family history.  Reviewed problem list, medications and allergies. Physical Assessment:   Vitals:   07/05/20 0834  BP: 125/75  Pulse: 88  Weight: 290 lb (131.5 kg)  Body mass index is 46.11 kg/m.        Physical Examination:   General appearance: Well appearing, and in no distress  Mental status: Alert, oriented to person, place, and time  Skin: Warm & dry  Cardiovascular: Normal heart rate noted  Respiratory: Normal respiratory effort, no distress  Abdomen: Soft, gravid, nontender  Pelvic: Cervical exam deferred         Extremities: Edema: Trace  Fetal Status: Fetal Heart Rate (bpm): 140 Fundal Height: 25 cm Movement:  Present    Chaperone: N/A   No results found for this or any previous visit (from the past 24 hour(s)).  Assessment & Plan:  1) Low-risk pregnancy G3P1011 at [redacted]w[redacted]d with an Estimated Date of Delivery: 10/28/20      Meds: No orders of the defined types were placed in this encounter.  Labs/procedures today:   Plan:  Continue routine obstetrical care  Next visit: prefers in person    Reviewed: Preterm labor symptoms and general obstetric precautions including but not limited to vaginal bleeding, contractions, leaking of fluid and fetal movement were reviewed in detail with the patient.  All questions were answered. Has home bp cuff. Rx faxed to . Check bp weekly, let know if >140/90.   Follow-up: Return in about 4 weeks (around 08/02/2020) for PN2, LROB.  No future appointments.  Orders Placed This Encounter  Procedures  . POC Urinalysis Dipstick OB   [redacted]w[redacted]d Darris Staiger  07/05/2020 9:05 AM

## 2020-07-21 IMAGING — US US MFM FETAL BPP WO NON STRESS
1 series · 15 of 28 positions shown · non-contrast
Comparison: none

[Series 1: us mfm fetal bpp wo non stress · 29 acquisitions, 15 frames shown]
[im 1/29]
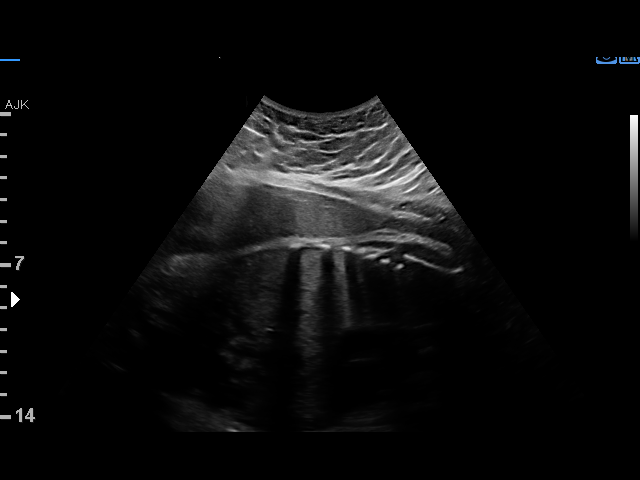
[im 3/29]
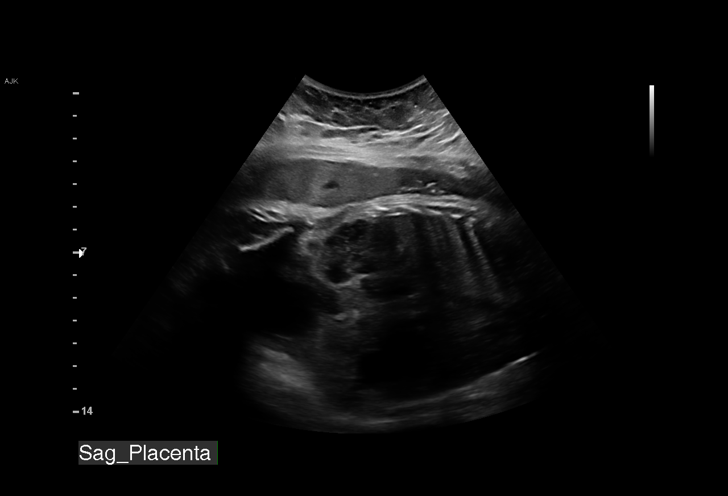
[im 5/29]
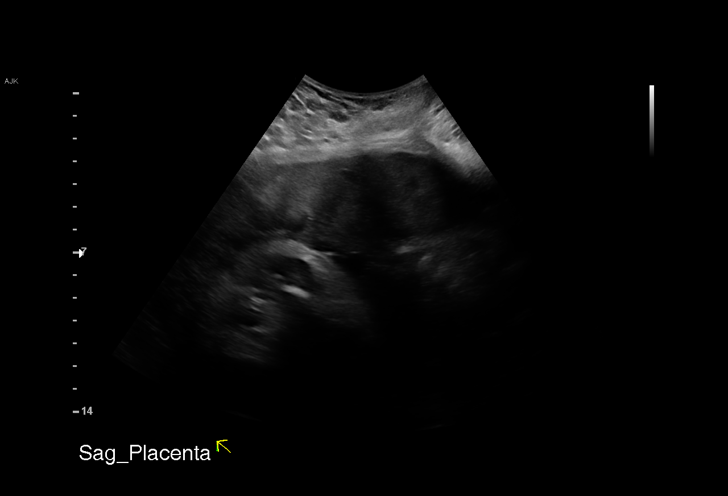
[im 7/29]
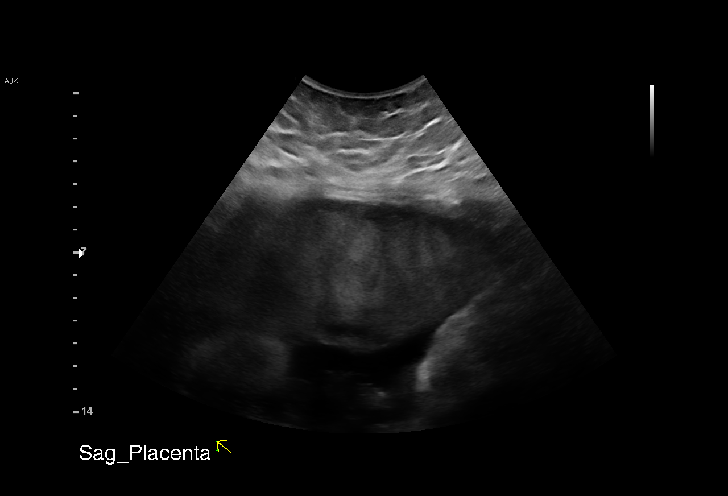
[im 9/29]
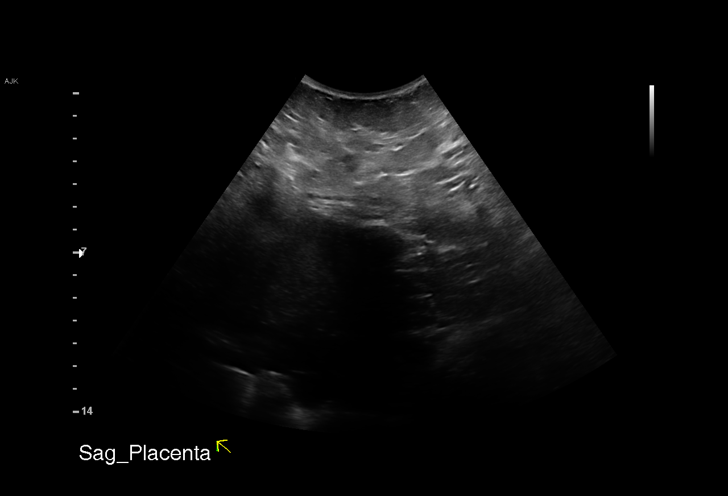
[im 11/29]
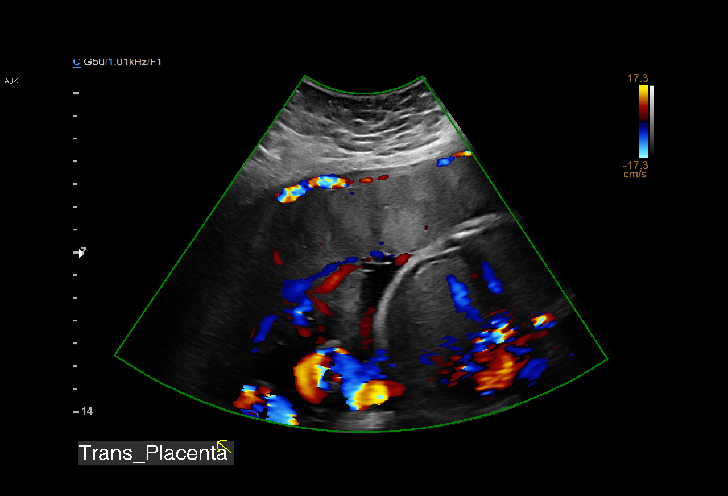
[im 13/29]
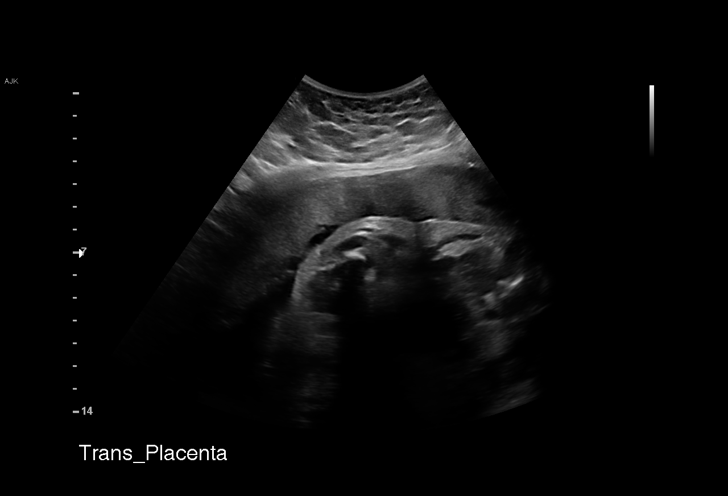
[im 15/29]
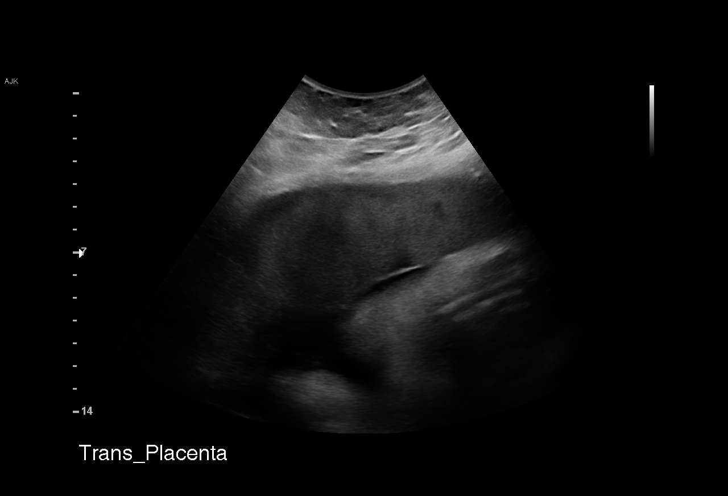
[im 16/29]
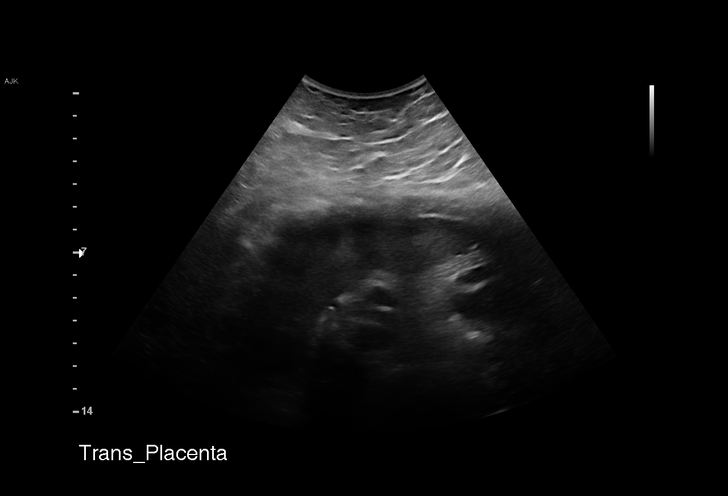
[im 18/29]
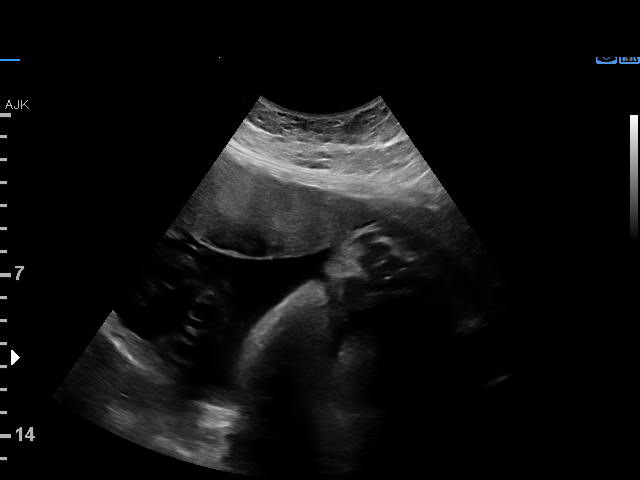
[im 20/29]
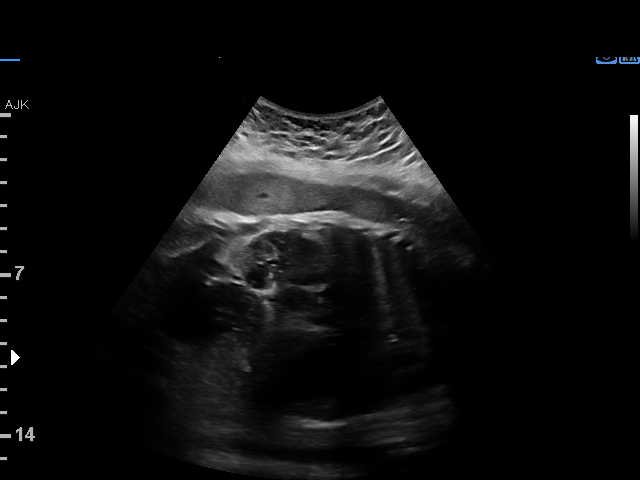
[im 22/29]
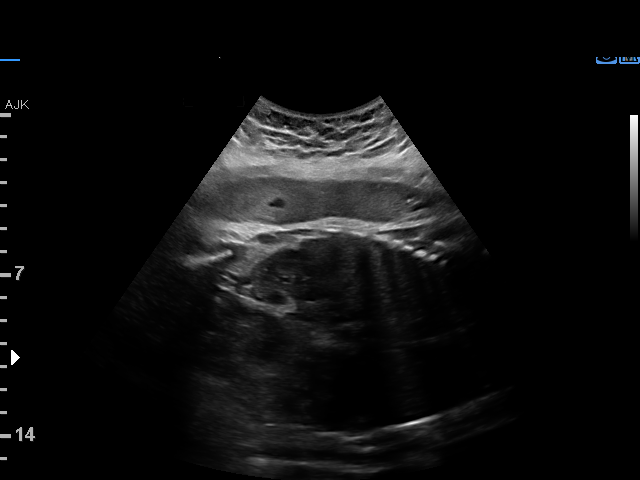
[im 24/29]
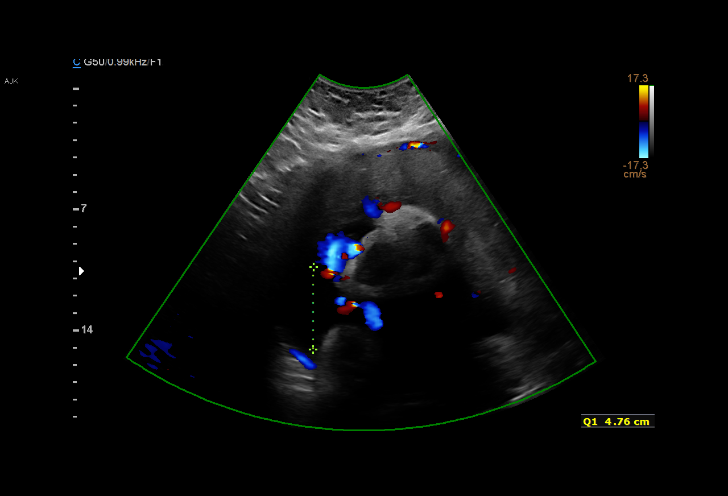
[im 26/29]
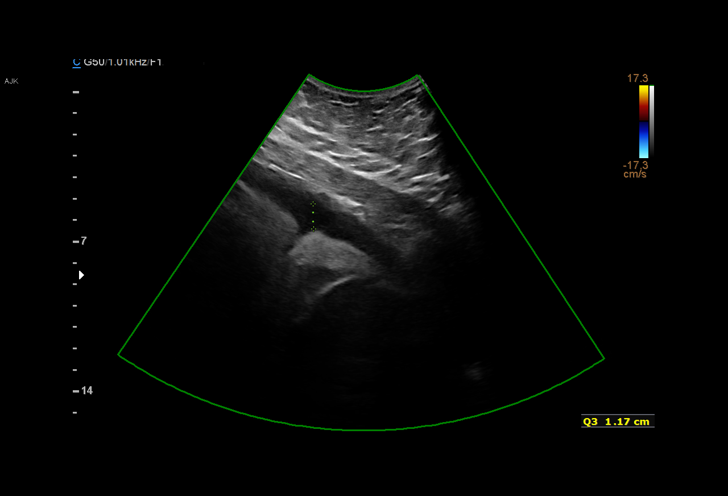
[im 29/29]
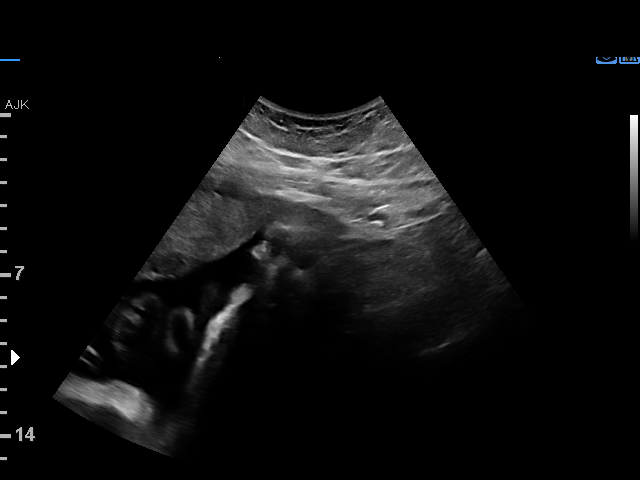

[15 of 28 positions shown; findings below may reference images not displayed]

----------------------------------------------------------------------

 ----------------------------------------------------------------------
Indications

  37 weeks gestation of pregnancy
  Decreased fetal movement
 ----------------------------------------------------------------------
Fetal Evaluation

 Num Of Fetuses:         1
 Cardiac Activity:       Observed
 Presentation:           Cephalic
 Placenta:               Anterior
 P. Cord Insertion:      Visualized

 Amniotic Fluid
 AFI FV:      Within normal limits

 AFI Sum(cm)     %Tile       Largest Pocket(cm)
 13.51           50

 RUQ(cm)       RLQ(cm)       LUQ(cm)        LLQ(cm)


 Comment:    Stomach and bladder noted.
Biophysical Evaluation

 Amniotic F.V:   Within normal limits       F. Tone:        Observed
 F. Movement:    Observed                   Score:          [DATE]
 F. Breathing:   Observed
OB History

 Gravidity:    2          SAB:   1
Gestational Age
 LMP:           39w 4d        Date:  03/29/18                 EDD:   01/03/19
 Best:          37w 0d     Det. By:  Previous Ultrasound      EDD:   01/21/19
                                     (06/07/18)
Impression

 Patient was evaluated for c/o decreased fetal movements.
 Amniotic fluid is normal and good fetal activity is seen.
 Antenatal testing is reassuring. BPP [DATE].
                 Kong, Ramos

## 2020-07-24 NOTE — L&D Delivery Note (Addendum)
OB/GYN Faculty Practice Delivery Note  Leah Kennedy is a 25 y.o. G3P1011 s/p VD at [redacted]w[redacted]d She was admitted for SOL.  ROM: 0h 152mith clear fluid GBS Status: positive Maximum Maternal Temperature: 97.7 F  Labor Progress: Admitted for active labor, SROM, and progressed to complete.  Delivery Date/Time: 10/18/20 at 1400 Delivery: Called to room and patient was complete and pushing. Head delivered ROA. Nuchal cord present x1. Shoulder and body delivered in usual fashion. Infant with spontaneous cry, placed on mother's abdomen, dried and stimulated. Cord clamped x 2 after 1-minute delay, and cut by FOB under my direct supervision. Cord blood drawn. Placenta delivered spontaneously with gentle cord traction. Fundus firm with massage and Pitocin. Labia, perineum, vagina, and cervix were inspected, lacerations noted below.   Placenta: Intact, 3 vessel cord Complications: Nuchal cord x1 Lacerations: Hemostatic left labial , superficial perineal skin-hemostatic EBL: 100 mL Analgesia: Epidural   Postpartum Planning #Rubella NI: MMR   Infant: Leah Kennedy (Circ OP)  APGARs TBD  TBD g  JaLawana ChambersD, PGY1   Midwife attestation: I was gloved and present for delivery in its entirety and I agree with the above resident's note.  MeJulianne HandlerCNM 3:13 PM

## 2020-08-06 ENCOUNTER — Other Ambulatory Visit: Payer: Self-pay

## 2020-08-06 ENCOUNTER — Encounter: Payer: Self-pay | Admitting: Medical

## 2020-08-06 ENCOUNTER — Other Ambulatory Visit: Payer: Medicaid Other

## 2020-08-06 ENCOUNTER — Ambulatory Visit (INDEPENDENT_AMBULATORY_CARE_PROVIDER_SITE_OTHER): Payer: Medicaid Other | Admitting: Medical

## 2020-08-06 VITALS — BP 126/89 | HR 86 | Wt 294.0 lb

## 2020-08-06 DIAGNOSIS — Z3A28 28 weeks gestation of pregnancy: Secondary | ICD-10-CM

## 2020-08-06 DIAGNOSIS — R12 Heartburn: Secondary | ICD-10-CM

## 2020-08-06 DIAGNOSIS — Z131 Encounter for screening for diabetes mellitus: Secondary | ICD-10-CM | POA: Diagnosis not present

## 2020-08-06 DIAGNOSIS — Z2839 Other underimmunization status: Secondary | ICD-10-CM

## 2020-08-06 DIAGNOSIS — Z3480 Encounter for supervision of other normal pregnancy, unspecified trimester: Secondary | ICD-10-CM

## 2020-08-06 DIAGNOSIS — Z23 Encounter for immunization: Secondary | ICD-10-CM

## 2020-08-06 DIAGNOSIS — O99891 Other specified diseases and conditions complicating pregnancy: Secondary | ICD-10-CM

## 2020-08-06 DIAGNOSIS — O26893 Other specified pregnancy related conditions, third trimester: Secondary | ICD-10-CM

## 2020-08-06 DIAGNOSIS — Z283 Underimmunization status: Secondary | ICD-10-CM

## 2020-08-06 NOTE — Progress Notes (Signed)
   PRENATAL VISIT NOTE  Subjective:  Leah Kennedy is a 25 y.o. G3P1011 at 51w1dbeing seen today for ongoing prenatal care.  She is currently monitored for the following issues for this low-risk pregnancy and has Chlamydia; Morbid [place] ([place]; Encounter for supervision of normal pregnancy, antepartum; Marijuana use; and Rubella non-immune status, antepartum on their problem list.  Patient reports heartburn.  Contractions: Not present. Vag. Bleeding: None.  Movement: Present. Denies leaking of fluid.   The following portions of the patient's history were reviewed and updated as appropriate: allergies, current medications, past family history, past medical history, past social history, past surgical history and problem list.   Objective:   Vitals:   08/06/20 0850  BP: 126/89  Pulse: 86  Weight: 294 lb (133.4 kg)    Fetal Status: Fetal Heart Rate (bpm): 135   Movement: Present     General:  Alert, oriented and cooperative. Patient is in no acute distress.  Skin: Skin is warm and dry. No rash noted.   Cardiovascular: Normal heart rate noted  Respiratory: Normal respiratory effort, no problems with respiration noted  Abdomen: Soft, gravid, appropriate for gestational age.  Pain/Pressure: Absent     Pelvic: Cervical exam deferred        Extremities: Normal range of motion.  Edema: Trace  Mental Status: Normal mood and affect. Normal behavior. Normal judgment and thought content.   Assessment and Plan:  Pregnancy: G3P1011 at 259w1d. Supervision of other normal pregnancy, antepartum - Doing well - 2 hour GTT, CBC, HIV, RPR today  - TDAP today  - Still unsure of MOC - Planning to breastfeed - Uses [facility]  2. Rubella non-immune status, antepartum - PP MMR  3. [place]. Heartburn during pregnancy, antepartum, third trimester - Taking TUMs which are helping - List of OTC medications safe in pregnancy given  5. [redacted] weeks gestation of  pregnancy  Preterm labor symptoms and general obstetric precautions including but not limited to vaginal bleeding, contractions, leaking of fluid and fetal movement were reviewed in detail with the patient. Please refer to After Visit Summary for other counseling recommendations.   Return in about 2 weeks (around 08/20/2020) for LOB, Virtual.  No future appointments.  JuKerry HoughPA-C

## 2020-08-06 NOTE — Patient Instructions (Addendum)
Fetal Movement Counts Patient Name: ________________________________________________ Patient Due Date: ____________________  What is a fetal movement count? A fetal movement count is the number of times that you feel your baby move during a certain amount of time. This may also be called a fetal kick count. A fetal movement count is recommended for every pregnant woman. You may be asked to start counting fetal movements as early as week 28 of your pregnancy. Pay attention to when your baby is most active. You may notice your baby's sleep and wake cycles. You may also notice things that make your baby move more. You should do a fetal movement count:  When your baby is normally most active.  At the same time each day. A good time to count movements is while you are resting, after having something to eat and drink. How do I count fetal movements? 1. Find a quiet, comfortable area. Sit, or lie down on your side. 2. Write down the date, the start time and stop time, and the number of movements that you felt between those two times. Take this information with you to your health care visits. 3. Write down your start time when you feel the first movement. 4. Count kicks, flutters, swishes, rolls, and jabs. You should feel at least 10 movements. 5. You may stop counting after you have felt 10 movements, or if you have been counting for 2 hours. Write down the stop time. 6. If you do not feel 10 movements in 2 hours, contact your health care provider for further instructions. Your health care provider may want to do additional tests to assess your baby's well-being. Contact a health care provider if:  You feel fewer than 10 movements in 2 hours.  Your baby is not moving like he or she usually does. Date: ____________ Start time: ____________ Stop time: ____________ Movements: ____________ Date: ____________ Start time: ____________ Stop time: ____________ Movements: ____________ Date: ____________  Start time: ____________ Stop time: ____________ Movements: ____________ Date: ____________ Start time: ____________ Stop time: ____________ Movements: ____________ Date: ____________ Start time: ____________ Stop time: ____________ Movements: ____________ Date: ____________ Start time: ____________ Stop time: ____________ Movements: ____________ Date: ____________ Start time: ____________ Stop time: ____________ Movements: ____________ Date: ____________ Start time: ____________ Stop time: ____________ Movements: ____________ Date: ____________ Start time: ____________ Stop time: ____________ Movements: ____________ This information is not intended to replace advice given to you by your health care provider. Make sure you discuss any questions you have with your health care provider. Document Revised: 02/27/2019 Document Reviewed: 02/27/2019 Elsevier Patient Education  2021 Elsevier Inc. Rosen's Emergency Medicine: Concepts and Clinical Practice (9th ed., pp. 2296- 2312). Elsevier.">  Braxton Hicks Contractions Contractions of the uterus can occur throughout pregnancy, but they are not always a sign that you are in labor. You may have practice contractions called Braxton Hicks contractions. These false labor contractions are sometimes confused with true labor. What are Braxton Hicks contractions? Braxton Hicks contractions are tightening movements that occur in the muscles of the uterus before labor. Unlike true labor contractions, these contractions do not result in opening (dilation) and thinning of the cervix. Toward the end of pregnancy (32-34 weeks), Braxton Hicks contractions can happen more often and may become stronger. These contractions are sometimes difficult to tell apart from true labor because they can be very uncomfortable. You should not feel embarrassed if you go to the hospital with false labor. Sometimes, the only way to tell if you are in true labor is for your   health care  provider to look for changes in the cervix. The health care provider will do a physical exam and may monitor your contractions. If you are not in true labor, the exam should show that your cervix is not dilating and your water has not broken. If there are no other health problems associated with your pregnancy, it is completely safe for you to be sent home with false labor. You may continue to have Braxton Hicks contractions until you go into true labor. How to tell the difference between true labor and false labor True labor  Contractions last 30-70 seconds.  Contractions become very regular.  Discomfort is usually felt in the top of the uterus, and it spreads to the lower abdomen and low back.  Contractions do not go away with walking.  Contractions usually become more intense and increase in frequency.  The cervix dilates and gets thinner. False labor  Contractions are usually shorter and not as strong as true labor contractions.  Contractions are usually irregular.  Contractions are often felt in the front of the lower abdomen and in the groin.  Contractions may go away when you walk around or change positions while lying down.  Contractions get weaker and are shorter-lasting as time goes on.  The cervix usually does not dilate or become thin. Follow these instructions at home:  Take over-the-counter and prescription medicines only as told by your health care provider.  Keep up with your usual exercises and follow other instructions from your health care provider.  Eat and drink lightly if you think you are going into labor.  If Braxton Hicks contractions are making you uncomfortable: ? Change your position from lying down or resting to walking, or change from walking to resting. ? Sit and rest in a tub of warm water. ? Drink enough fluid to keep your urine pale yellow. Dehydration may cause these contractions. ? Do slow and deep breathing several times an hour.  Keep  all follow-up prenatal visits as told by your health care provider. This is important.   Contact a health care provider if:  You have a fever.  You have continuous pain in your abdomen. Get help right away if:  Your contractions become stronger, more regular, and closer together.  You have fluid leaking or gushing from your vagina.  You pass blood-tinged mucus (bloody show).  You have bleeding from your vagina.  You have low back pain that you never had before.  You feel your baby's head pushing down and causing pelvic pressure.  Your baby is not moving inside you as much as it used to. Summary  Contractions that occur before labor are called Braxton Hicks contractions, false labor, or practice contractions.  Braxton Hicks contractions are usually shorter, weaker, farther apart, and less regular than true labor contractions. True labor contractions usually become progressively stronger and regular, and they become more frequent.  Manage discomfort from Braxton Hicks contractions by changing position, resting in a warm bath, drinking plenty of water, or practicing deep breathing. This information is not intended to replace advice given to you by your health care provider. Make sure you discuss any questions you have with your health care provider. Document Revised: 06/22/2017 Document Reviewed: 11/23/2016 Elsevier Patient Education  2021 Elsevier Inc.   Safe Medications in Pregnancy   Acne:  Benzoyl Peroxide  Salicylic Acid   Backache/Headache:  Tylenol: 2 regular strength every 4 hours OR        2 Extra strength   every 6 hours   Colds/Coughs/Allergies:  Benadryl (alcohol free) 25 mg every 6 hours as needed  Breath right strips  Claritin  Cepacol throat lozenges  Chloraseptic throat spray  Cold-Eeze- up to three times per day  Cough drops, alcohol free  Flonase (by prescription only)  Guaifenesin  Mucinex  Robitussin DM (plain only, alcohol free)  Saline  nasal spray/drops  Sudafed (pseudoephedrine) & Actifed * use only after [redacted] weeks gestation and if you do not have high blood pressure  Tylenol  Vicks Vaporub  Zinc lozenges  Zyrtec   Constipation:  Colace  Ducolax suppositories  Fleet enema  Glycerin suppositories  Metamucil  Milk of magnesia  Miralax  Senokot  Smooth move tea   Diarrhea:  Kaopectate  Imodium A-D   *NO pepto Bismol   Hemorrhoids:  Anusol  Anusol HC  Preparation H  Tucks   Indigestion:  Tums  Maalox  Mylanta  Zantac  Pepcid   Insomnia:  Benadryl (alcohol free) 25mg every 6 hours as needed  Tylenol PM  Unisom, no Gelcaps   Leg Cramps:  Tums  MagGel   Nausea/Vomiting:  Bonine  Dramamine  Emetrol  Ginger extract  Sea bands  Meclizine  Nausea medication to take during pregnancy:  Unisom (doxylamine succinate 25 mg tablets) Take one tablet daily at bedtime. If symptoms are not adequately controlled, the dose can be increased to a maximum recommended dose of two tablets daily (1/2 tablet in the morning, 1/2 tablet mid-afternoon and one at bedtime).  Vitamin B6 100mg tablets. Take one tablet twice a day (up to 200 mg per day).   Skin Rashes:  Aveeno products  Benadryl cream or 25mg every 6 hours as needed  Calamine Lotion  1% cortisone cream   Yeast infection:  Gyne-lotrimin 7  Monistat 7    **If taking multiple medications, please check labels to avoid duplicating the same active ingredients  **take medication as directed on the label  ** Do not exceed 4000 mg of tylenol in 24 hours  **Do not take medications that contain aspirin or ibuprofen           

## 2020-08-07 LAB — CBC
Hematocrit: 31.7 % — ABNORMAL LOW (ref 34.0–46.6)
Hemoglobin: 10.6 g/dL — ABNORMAL LOW (ref 11.1–15.9)
MCH: 28.4 pg (ref 26.6–33.0)
MCHC: 33.4 g/dL (ref 31.5–35.7)
MCV: 85 fL (ref 79–97)
Platelets: 282 10*3/uL (ref 150–450)
RBC: 3.73 x10E6/uL — ABNORMAL LOW (ref 3.77–5.28)
RDW: 13.3 % (ref 11.7–15.4)
WBC: 11.8 10*3/uL — ABNORMAL HIGH (ref 3.4–10.8)

## 2020-08-07 LAB — RPR: RPR Ser Ql: NONREACTIVE

## 2020-08-07 LAB — GLUCOSE TOLERANCE, 2 HOURS W/ 1HR
Glucose, 1 hour: 101 mg/dL (ref 65–179)
Glucose, 2 hour: 68 mg/dL (ref 65–152)
Glucose, Fasting: 75 mg/dL (ref 65–91)

## 2020-08-07 LAB — ANTIBODY SCREEN: Antibody Screen: NEGATIVE

## 2020-08-07 LAB — HIV ANTIBODY (ROUTINE TESTING W REFLEX): HIV Screen 4th Generation wRfx: NONREACTIVE

## 2020-08-20 ENCOUNTER — Telehealth (INDEPENDENT_AMBULATORY_CARE_PROVIDER_SITE_OTHER): Payer: Medicaid Other | Admitting: Medical

## 2020-08-20 ENCOUNTER — Encounter: Payer: Self-pay | Admitting: Medical

## 2020-08-20 VITALS — BP 124/78 | HR 73 | Wt 291.0 lb

## 2020-08-20 DIAGNOSIS — O99213 Obesity complicating pregnancy, third trimester: Secondary | ICD-10-CM

## 2020-08-20 DIAGNOSIS — Z8742 Personal history of other diseases of the female genital tract: Secondary | ICD-10-CM | POA: Diagnosis not present

## 2020-08-20 DIAGNOSIS — Z283 Underimmunization status: Secondary | ICD-10-CM | POA: Diagnosis not present

## 2020-08-20 DIAGNOSIS — Z3A30 30 weeks gestation of pregnancy: Secondary | ICD-10-CM

## 2020-08-20 DIAGNOSIS — Z3480 Encounter for supervision of other normal pregnancy, unspecified trimester: Secondary | ICD-10-CM

## 2020-08-20 DIAGNOSIS — Z2839 Other underimmunization status: Secondary | ICD-10-CM

## 2020-08-20 DIAGNOSIS — O99891 Other specified diseases and conditions complicating pregnancy: Secondary | ICD-10-CM

## 2020-08-20 NOTE — Progress Notes (Signed)
    TELEHEALTH OBSTETRICS VISIT ENCOUNTER NOTE  Provider location: [facility]   I connected with Leah Kennedy on 08/20/20 at 10:30 AM EST by telephone at home and verified that I am speaking with the correct person using two identifiers. Of note, unable to do video encounter due to technical difficulties.    I discussed the limitations, risks, security and privacy concerns of performing an evaluation and management service by telephone and the availability of in person appointments. I also discussed with the patient that there may be a patient responsible charge related to this service. The patient expressed understanding and agreed to proceed.  Subjective:  Leah Kennedy is a 25 y.o. G3P1011 at [redacted]w[redacted]d being followed for ongoing prenatal care.  She is currently monitored for the following issues for this low-risk pregnancy and has Chlamydia; Morbid obesity ([place]); Encounter for supervision of normal pregnancy, antepartum; Marijuana use; and Rubella non-immune status, antepartum on their problem list.  Patient reports no complaints. Reports fetal movement. Denies any contractions, bleeding or leaking of fluid.   The following portions of the patient's history were reviewed and updated as appropriate: allergies, current medications, past family history, past medical history, past social history, past surgical history and problem list.   Objective:  Blood pressure 124/78, pulse 73, weight 291 lb (132 kg), last menstrual period 01/08/2020, not currently breastfeeding. General:  Alert, oriented and cooperative.   Mental Status: Normal mood and affect perceived. Normal judgment and thought content.  Rest of physical exam deferred due to type of encounter  Assessment and Plan:  Pregnancy: G3P1011 at [redacted]w[redacted]d 1. Supervision of other normal pregnancy, antepartum - Doing well - Normal third trimester labs from last visit reviewed - Discussed MOC, planning condoms.  Discussed optimal birth spacing of at least 18 months  2. Rubella non-immune status, antepartum - PP MMR  3. Morbid obesity ([place])  4. [redacted] weeks gestation   Preterm labor symptoms and general obstetric precautions including but not limited to vaginal bleeding, contractions, leaking of fluid and fetal movement were reviewed in detail with the patient.  I discussed the assessment and treatment plan with the patient. The patient was provided an opportunity to ask questions and all were answered. The patient agreed with the plan and demonstrated an understanding of the instructions. The patient was advised to call back or seek an in-person office evaluation/go to [facility] at [facility] for any urgent or concerning symptoms. Please refer to After Visit Summary for other counseling recommendations.   I provided 15 minutes of non-face-to-face time during this encounter.  Return in about 2 weeks (around 09/03/2020) for LOB, Virtual, Midwife preferred.  No future appointments.  Kerry Hough, PA-C [facility], [place]

## 2020-08-20 NOTE — Patient Instructions (Addendum)
Fetal Movement Counts Patient Name: ________________________________________________ Patient Due Date: ____________________  What is a fetal movement count? A fetal movement count is the number of times that you feel your baby move during a certain amount of time. This may also be called a fetal kick count. A fetal movement count is recommended for every pregnant woman. You may be asked to start counting fetal movements as early as week 28 of your pregnancy. Pay attention to when your baby is most active. You may notice your baby's sleep and wake cycles. You may also notice things that make your baby move more. You should do a fetal movement count:  When your baby is normally most active.  At the same time each day. A good time to count movements is while you are resting, after having something to eat and drink. How do I count fetal movements? 1. Find a quiet, comfortable area. Sit, or lie down on your side. 2. Write down the date, the start time and stop time, and the number of movements that you felt between those two times. Take this information with you to your health care visits. 3. Write down your start time when you feel the first movement. 4. Count kicks, flutters, swishes, rolls, and jabs. You should feel at least 10 movements. 5. You may stop counting after you have felt 10 movements, or if you have been counting for 2 hours. Write down the stop time. 6. If you do not feel 10 movements in 2 hours, contact your health care provider for further instructions. Your health care provider may want to do additional tests to assess your baby's well-being. Contact a health care provider if:  You feel fewer than 10 movements in 2 hours.  Your baby is not moving like he or she usually does. Date: ____________ Start time: ____________ Stop time: ____________ Movements: ____________ Date: ____________ Start time: ____________ Stop time: ____________ Movements: ____________ Date: ____________  Start time: ____________ Stop time: ____________ Movements: ____________ Date: ____________ Start time: ____________ Stop time: ____________ Movements: ____________ Date: ____________ Start time: ____________ Stop time: ____________ Movements: ____________ Date: ____________ Start time: ____________ Stop time: ____________ Movements: ____________ Date: ____________ Start time: ____________ Stop time: ____________ Movements: ____________ Date: ____________ Start time: ____________ Stop time: ____________ Movements: ____________ Date: ____________ Start time: ____________ Stop time: ____________ Movements: ____________ This information is not intended to replace advice given to you by your health care provider. Make sure you discuss any questions you have with your health care provider. Document Revised: 02/27/2019 Document Reviewed: 02/27/2019 Elsevier Patient Education  2021 Elsevier Inc. Rosen's Emergency Medicine: Concepts and Clinical Practice (9th ed., pp. 2296- 2312). Elsevier.">  Braxton Hicks Contractions Contractions of the uterus can occur throughout pregnancy, but they are not always a sign that you are in labor. You may have practice contractions called Braxton Hicks contractions. These false labor contractions are sometimes confused with true labor. What are Braxton Hicks contractions? Braxton Hicks contractions are tightening movements that occur in the muscles of the uterus before labor. Unlike true labor contractions, these contractions do not result in opening (dilation) and thinning of the cervix. Toward the end of pregnancy (32-34 weeks), Braxton Hicks contractions can happen more often and may become stronger. These contractions are sometimes difficult to tell apart from true labor because they can be very uncomfortable. You should not feel embarrassed if you go to the hospital with false labor. Sometimes, the only way to tell if you are in true labor is for your   health care  provider to look for changes in the cervix. The health care provider will do a physical exam and may monitor your contractions. If you are not in true labor, the exam should show that your cervix is not dilating and your water has not broken. If there are no other health problems associated with your pregnancy, it is completely safe for you to be sent home with false labor. You may continue to have Braxton Hicks contractions until you go into true labor. How to tell the difference between true labor and false labor True labor  Contractions last 30-70 seconds.  Contractions become very regular.  Discomfort is usually felt in the top of the uterus, and it spreads to the lower abdomen and low back.  Contractions do not go away with walking.  Contractions usually become more intense and increase in frequency.  The cervix dilates and gets thinner. False labor  Contractions are usually shorter and not as strong as true labor contractions.  Contractions are usually irregular.  Contractions are often felt in the front of the lower abdomen and in the groin.  Contractions may go away when you walk around or change positions while lying down.  Contractions get weaker and are shorter-lasting as time goes on.  The cervix usually does not dilate or become thin. Follow these instructions at home:  Take over-the-counter and prescription medicines only as told by your health care provider.  Keep up with your usual exercises and follow other instructions from your health care provider.  Eat and drink lightly if you think you are going into labor.  If Braxton Hicks contractions are making you uncomfortable: ? Change your position from lying down or resting to walking, or change from walking to resting. ? Sit and rest in a tub of warm water. ? Drink enough fluid to keep your urine pale yellow. Dehydration may cause these contractions. ? Do slow and deep breathing several times an hour.  Keep  all follow-up prenatal visits as told by your health care provider. This is important.   Contact a health care provider if:  You have a fever.  You have continuous pain in your abdomen. Get help right away if:  Your contractions become stronger, more regular, and closer together.  You have fluid leaking or gushing from your vagina.  You pass blood-tinged mucus (bloody show).  You have bleeding from your vagina.  You have low back pain that you never had before.  You feel your baby's head pushing down and causing pelvic pressure.  Your baby is not moving inside you as much as it used to. Summary  Contractions that occur before labor are called Braxton Hicks contractions, false labor, or practice contractions.  Braxton Hicks contractions are usually shorter, weaker, farther apart, and less regular than true labor contractions. True labor contractions usually become progressively stronger and regular, and they become more frequent.  Manage discomfort from [facility] contractions by changing position, resting in a warm bath, drinking plenty of water, or practicing deep breathing. This information is not intended to replace advice given to you by your health care provider. Make sure you discuss any questions you have with your health care provider. Document Revised: 06/22/2017 Document Reviewed: 11/23/2016 Elsevier Patient Education  2021 [employer].  DOULA LIST   [facility] Doula  [place]  [phone]  [email or web address]  [email or web address]  Zula the [employer] Price [phone]  [email or web address]   Landscape architect, [employer]   Precious J.  Elige Radon   [email or web address]   ??THE MOTHERLY DOULA?? Zola Button   [phone]   [email or web address]     [facility] Doula  Olive Bass  [phone]    [email or web address]   [employer] [facility]  Angie Rosier      [phone]     [email or web address]   Renato Gails: Doula & Photographer   Renato Gails [phone]       [email or web address]  [email or web address]   [employer] [facility]oula [facility]  [place] [phone]   [email or web address] [place], [place]  Lolita Rieger  [phone]  [email or web address]   [email or web address]   [facility]   Kizzie Furnish   [phone]  [email or web address]   Dina Rich [place] Doula  Dina Rich  [phone] [email or web address]  [facility] Doulas  Cornelious Bryant         [facility]       Lora Reynolds     [phone] [email or web address]  [email or web address]   [facility]   [place] [phone] [email or web address]   [employer] [facility]  Camelia Eng     [phone]  [email or web address]  [facility]     [phone]  [email or web address]  Facebook and IG @[email or web address] .  [phone] [email or web address] .[phone] [email or web address]   [phone]  [phone] [email or web address]     [facility]  [phone] [email or web address]   IG @[email or web address]    [phone]    [phone] [email or web address]   Lurline Hare "Girard" Free  [phone] [email or web address]    Mtende Roll  [phone] [email or web address]   Susie Williams   [email or web address]    [phone]    [phone] [email or web address]     Denita Lung  [phone] [email or web address] .Lenard Galloway  [phone] [email or web address]    [place]    [phone]   [facility] Red wing  [phone] [email or web address]  Doula Mama [phone]  [email or web address]  [facility] Maryjean Ka  [phone] [email or web address]  [facility] [phone]  [email or web address]  [email or web address] Montgomery Harris-Jones  [phone] [email or web address]   Allen Kell [phone] [email or web address] .Ardine Eng   [phone] [email or web address]   Gilda Crease  [phone] [email or web address]  [employer]  Jonelle Sports  [phone] [email or web address]   Jackelyn Poling [phone] [email or web address]  MOOR Divine Myeka Dunn  [email or web address]   Cristina Gong [phone] [email or web address]   Glory Buff [phone] [email or web address]   Whitney Muse [phone] [email or web address]

## 2020-08-31 ENCOUNTER — Telehealth: Payer: Self-pay | Admitting: Obstetrics & Gynecology

## 2020-08-31 NOTE — Telephone Encounter (Signed)
Returned pt's call to get clarification on symptoms. Pt states that she is probably having Braxton Hicks contractions, but wanted to make sure. She stated that the pain was not severe, the baby was moving well, no bleeding or spotting. She was encouraged to lay on her left side and stay hydrated. If anything changed with baby movement or severity of symptoms increased, she should go get checked out at the hospital. Pt wanted to change her 2/11 appt to an in person visit instead of virtual. She was transferred to [facility] to reschedule.

## 2020-08-31 NOTE — Telephone Encounter (Signed)
Patient stated that she is experiencing some lower abdominal pain and some pain on her left side. Patient wants a call back from the nurse. Clinical staff will follow up with patient.

## 2020-09-03 ENCOUNTER — Other Ambulatory Visit: Payer: Self-pay

## 2020-09-03 ENCOUNTER — Ambulatory Visit (INDEPENDENT_AMBULATORY_CARE_PROVIDER_SITE_OTHER): Payer: Medicaid Other | Admitting: Medical

## 2020-09-03 VITALS — BP 129/84 | HR 104 | Wt 294.0 lb

## 2020-09-03 DIAGNOSIS — Z3480 Encounter for supervision of other normal pregnancy, unspecified trimester: Secondary | ICD-10-CM

## 2020-09-03 DIAGNOSIS — Z2839 Other underimmunization status: Secondary | ICD-10-CM

## 2020-09-03 DIAGNOSIS — Z283 Underimmunization status: Secondary | ICD-10-CM

## 2020-09-03 DIAGNOSIS — F1290 Cannabis use, unspecified, uncomplicated: Secondary | ICD-10-CM

## 2020-09-03 DIAGNOSIS — O99891 Other specified diseases and conditions complicating pregnancy: Secondary | ICD-10-CM

## 2020-09-03 DIAGNOSIS — Z3A32 32 weeks gestation of pregnancy: Secondary | ICD-10-CM

## 2020-09-03 NOTE — Patient Instructions (Signed)
Fetal Movement Counts Patient Name: ________________________________________________ Patient Due Date: ____________________  What is a fetal movement count? A fetal movement count is the number of times that you feel your baby move during a certain amount of time. This may also be called a fetal kick count. A fetal movement count is recommended for every pregnant woman. You may be asked to start counting fetal movements as early as week 28 of your pregnancy. Pay attention to when your baby is most active. You may notice your baby's sleep and wake cycles. You may also notice things that make your baby move more. You should do a fetal movement count:  When your baby is normally most active.  At the same time each day. A good time to count movements is while you are resting, after having something to eat and drink. How do I count fetal movements? 1. Find a quiet, comfortable area. Sit, or lie down on your side. 2. Write down the date, the start time and stop time, and the number of movements that you felt between those two times. Take this information with you to your health care visits. 3. Write down your start time when you feel the first movement. 4. Count kicks, flutters, swishes, rolls, and jabs. You should feel at least 10 movements. 5. You may stop counting after you have felt 10 movements, or if you have been counting for 2 hours. Write down the stop time. 6. If you do not feel 10 movements in 2 hours, contact your health care provider for further instructions. Your health care provider may want to do additional tests to assess your baby's well-being. Contact a health care provider if:  You feel fewer than 10 movements in 2 hours.  Your baby is not moving like he or she usually does. Date: ____________ Start time: ____________ Stop time: ____________ Movements: ____________ Date: ____________ Start time: ____________ Stop time: ____________ Movements: ____________ Date: ____________  Start time: ____________ Stop time: ____________ Movements: ____________ Date: ____________ Start time: ____________ Stop time: ____________ Movements: ____________ Date: ____________ Start time: ____________ Stop time: ____________ Movements: ____________ Date: ____________ Start time: ____________ Stop time: ____________ Movements: ____________ Date: ____________ Start time: ____________ Stop time: ____________ Movements: ____________ Date: ____________ Start time: ____________ Stop time: ____________ Movements: ____________ Date: ____________ Start time: ____________ Stop time: ____________ Movements: ____________ This information is not intended to replace advice given to you by your health care provider. Make sure you discuss any questions you have with your health care provider. Document Revised: 02/27/2019 Document Reviewed: 02/27/2019 Elsevier Patient Education  2021 Elsevier Inc. Rosen's Emergency Medicine: Concepts and Clinical Practice (9th ed., pp. 2296- 2312). Elsevier.">    Braxton Hicks Contractions Contractions of the uterus can occur throughout pregnancy, but they are not always a sign that you are in labor. You may have practice contractions called Braxton Hicks contractions. These false labor contractions are sometimes confused with true labor. What are Braxton Hicks contractions? Braxton Hicks contractions are tightening movements that occur in the muscles of the uterus before labor. Unlike true labor contractions, these contractions do not result in opening (dilation) and thinning of the cervix. Toward the end of pregnancy (32-34 weeks), Braxton Hicks contractions can happen more often and may become stronger. These contractions are sometimes difficult to tell apart from true labor because they can be very uncomfortable. You should not feel embarrassed if you go to the hospital with false labor. Sometimes, the only way to tell if you are in true labor is   for your health care  provider to look for changes in the cervix. The health care provider will do a physical exam and may monitor your contractions. If you are not in true labor, the exam should show that your cervix is not dilating and your water has not broken. If there are no other health problems associated with your pregnancy, it is completely safe for you to be sent home with false labor. You may continue to have Braxton Hicks contractions until you go into true labor. How to tell the difference between true labor and false labor True labor  Contractions last 30-70 seconds.  Contractions become very regular.  Discomfort is usually felt in the top of the uterus, and it spreads to the lower abdomen and low back.  Contractions do not go away with walking.  Contractions usually become more intense and increase in frequency.  The cervix dilates and gets thinner. False labor  Contractions are usually shorter and not as strong as true labor contractions.  Contractions are usually irregular.  Contractions are often felt in the front of the lower abdomen and in the groin.  Contractions may go away when you walk around or change positions while lying down.  Contractions get weaker and are shorter-lasting as time goes on.  The cervix usually does not dilate or become thin. Follow these instructions at home:  Take over-the-counter and prescription medicines only as told by your health care provider.  Keep up with your usual exercises and follow other instructions from your health care provider.  Eat and drink lightly if you think you are going into labor.  If Braxton Hicks contractions are making you uncomfortable: ? Change your position from lying down or resting to walking, or change from walking to resting. ? Sit and rest in a tub of warm water. ? Drink enough fluid to keep your urine pale yellow. Dehydration may cause these contractions. ? Do slow and deep breathing several times an hour.  Keep  all follow-up prenatal visits as told by your health care provider. This is important.   Contact a health care provider if:  You have a fever.  You have continuous pain in your abdomen. Get help right away if:  Your contractions become stronger, more regular, and closer together.  You have fluid leaking or gushing from your vagina.  You pass blood-tinged mucus (bloody show).  You have bleeding from your vagina.  You have low back pain that you never had before.  You feel your baby's head pushing down and causing pelvic pressure.  Your baby is not moving inside you as much as it used to. Summary  Contractions that occur before labor are called Braxton Hicks contractions, false labor, or practice contractions.  Braxton Hicks contractions are usually shorter, weaker, farther apart, and less regular than true labor contractions. True labor contractions usually become progressively stronger and regular, and they become more frequent.  Manage discomfort from Braxton Hicks contractions by changing position, resting in a warm bath, drinking plenty of water, or practicing deep breathing. This information is not intended to replace advice given to you by your health care provider. Make sure you discuss any questions you have with your health care provider. Document Revised: 06/22/2017 Document Reviewed: 11/23/2016 Elsevier Patient Education  2021 Elsevier Inc.  

## 2020-09-03 NOTE — Progress Notes (Signed)
   PRENATAL VISIT NOTE  Subjective:  Leah Kennedy is a 25 y.o. G3P1011 at [redacted]w[redacted]d being seen today for ongoing prenatal care.  She is currently monitored for the following issues for this low-risk pregnancy and has Chlamydia; Morbid obesity ([place]); Encounter for supervision of normal pregnancy, antepartum; Marijuana use; and Rubella non-immune status, antepartum on their problem list.  Patient reports occasional contractions.  Contractions: Irritability. Vag. Bleeding: None.  Movement: Present. Denies leaking of fluid.   The following portions of the patient's history were reviewed and updated as appropriate: allergies, current medications, past family history, past medical history, past social history, past surgical history and problem list.   Objective:   Vitals:   09/03/20 1042  BP: 129/84  Pulse: (!) 104  Weight: 294 lb (133.4 kg)    Fetal Status: Fetal Heart Rate (bpm): 148   Movement: Present     General:  Alert, oriented and cooperative. Patient is in no acute distress.  Skin: Skin is warm and dry. No rash noted.   Cardiovascular: Normal heart rate noted  Respiratory: Normal respiratory effort, no problems with respiration noted  Abdomen: Soft, gravid, appropriate for gestational age.  Pain/Pressure: Present     Pelvic: Cervical exam performed in the presence of a chaperone Dilation: Closed Effacement (%): Thick    Extremities: Normal range of motion.  Edema: Trace  Mental Status: Normal mood and affect. Normal behavior. Normal judgment and thought content.   Assessment and Plan:  Pregnancy: G3P1011 at [redacted]w[redacted]d 1. Supervision of other normal pregnancy, antepartum - Patient states few BH contractions and requesting cervix check today - Last pregnancy delivered before due date, so was concerned for early labor - Cervix closed, thick, firm  2. Morbid obesity ([place])  3. Marijuana use  4. Rubella non-immune status, antepartum - PP MMR  5. [redacted] weeks gestation of  pregnancy  Preterm labor symptoms and general obstetric precautions including but not limited to vaginal bleeding, contractions, leaking of fluid and fetal movement were reviewed in detail with the patient. Please refer to After Visit Summary for other counseling recommendations.   Return in about 2 weeks (around 09/17/2020) for LOB, Midwife preferred, In-Person.  Future Appointments  Date Time Provider [place]  09/17/2020 11:20 AM Myrtis Ser, CNM [facility]    Kerry Hough, PA-C

## 2020-09-10 ENCOUNTER — Ambulatory Visit (INDEPENDENT_AMBULATORY_CARE_PROVIDER_SITE_OTHER): Payer: Medicaid Other | Admitting: Women's Health

## 2020-09-10 ENCOUNTER — Other Ambulatory Visit: Payer: Self-pay

## 2020-09-10 ENCOUNTER — Other Ambulatory Visit (HOSPITAL_COMMUNITY)
Admission: RE | Admit: 2020-09-10 | Discharge: 2020-09-10 | Disposition: A | Discharge status: Home / Self Care | Payer: Medicaid Other | Source: Ambulatory Visit | Attending: Obstetrics & Gynecology | Admitting: Obstetrics & Gynecology

## 2020-09-10 ENCOUNTER — Telehealth: Payer: Self-pay | Admitting: *Deleted

## 2020-09-10 VITALS — BP 111/68 | HR 72 | Wt 298.0 lb

## 2020-09-10 DIAGNOSIS — N760 Acute vaginitis: Secondary | ICD-10-CM

## 2020-09-10 DIAGNOSIS — O26893 Other specified pregnancy related conditions, third trimester: Secondary | ICD-10-CM

## 2020-09-10 DIAGNOSIS — B9689 Other specified bacterial agents as the cause of diseases classified elsewhere: Secondary | ICD-10-CM

## 2020-09-10 DIAGNOSIS — N898 Other specified noninflammatory disorders of vagina: Secondary | ICD-10-CM | POA: Diagnosis present

## 2020-09-10 DIAGNOSIS — Z3480 Encounter for supervision of other normal pregnancy, unspecified trimester: Secondary | ICD-10-CM

## 2020-09-10 DIAGNOSIS — O368130 Decreased fetal movements, third trimester, not applicable or unspecified: Secondary | ICD-10-CM | POA: Diagnosis not present

## 2020-09-10 DIAGNOSIS — Z3483 Encounter for supervision of other normal pregnancy, third trimester: Secondary | ICD-10-CM

## 2020-09-10 DIAGNOSIS — R102 Pelvic and perineal pain: Secondary | ICD-10-CM

## 2020-09-10 LAB — POCT URINALYSIS DIPSTICK OB
Blood, UA: NEGATIVE
Glucose, UA: NEGATIVE
Ketones, UA: NEGATIVE
Leukocytes, UA: NEGATIVE
Nitrite, UA: NEGATIVE

## 2020-09-10 MED ORDER — METRONIDAZOLE 500 MG PO TABS: 500.0000 mg | ORAL_TABLET | Freq: Two times a day (BID) | ORAL | 0 refills | Status: DC

## 2020-09-10 NOTE — Patient Instructions (Signed)
Leah Kennedy, I greatly value your feedback.  If you receive a survey following your visit with [place] today, we appreciate you taking the time to fill it out.  Thanks, [name], CNM, WHNP-BC  [facility] ([address] [place], [place] [address]) Entrance C, located off of [address] valet parking   Go to [email or web address] to register for FREE online childbirth classes    Call the office [phone]) or go to [facility] if:  You begin to have strong, frequent contractions  Your water breaks.  Sometimes it is a big gush of fluid, sometimes it is just a trickle that keeps getting your panties wet or running down your legs  You have vaginal bleeding.  It is normal to have a small amount of spotting if your cervix was checked.   You don't feel your baby moving like normal.  If you don't, get you something to eat and drink and lay down and focus on feeling your baby move.  You should feel at least 10 movements in 2 hours.  If you don't, you should call the office or go to [facility].   Call the office [phone]) or go to [facility] hospital for these signs of pre-eclampsia:  Severe headache that does not go away with Tylenol  Visual changes- seeing spots, double, blurred vision  Pain under your right breast or upper abdomen that does not go away with Tums or heartburn medicine  Nausea and/or vomiting  Severe swelling in your hands, feet, and face    Home Blood Pressure Monitoring for Patients   Your provider has recommended that you check your blood pressure (BP) at least once a week at home. If you do not have a blood pressure cuff at home, one will be provided for you. Contact your provider if you have not received your monitor within 1 week.   Helpful Tips for Accurate Home Blood Pressure Checks  . Don't smoke, exercise, or drink caffeine 30 minutes before checking your BP . Use the restroom before checking your BP (a full bladder  can raise your pressure) . Relax in a comfortable upright chair . Feet on the ground . Left arm resting comfortably on a flat surface at the level of your heart . Legs uncrossed . Back supported . Sit quietly and don't talk . Place the cuff on your bare arm . Adjust snuggly, so that only two fingertips can fit between your skin and the top of the cuff . Check 2 readings separated by at least one minute . Keep a log of your BP readings . For a visual, please reference this diagram: http://ccnc.care/bpdiagram  Provider Name: [facility]     Phone: [phone]  Zone 1: ALL CLEAR  Continue to monitor your symptoms:  . BP reading is less than 140 (top number) or less than 90 (bottom number)  . No right upper stomach pain . No headaches or seeing spots . No feeling nauseated or throwing up . No swelling in face and hands  Zone 2: CAUTION Call your doctor's office for any of the following:  . BP reading is greater than 140 (top number) or greater than 90 (bottom number)  . Stomach pain under your ribs in the middle or right side . Headaches or seeing spots . Feeling nauseated or throwing up . Swelling in face and hands  Zone 3: EMERGENCY  Seek immediate medical care if you have any of the  following:  . BP reading is greater than160 (top number) or greater than 110 (bottom number) . Severe headaches not improving with Tylenol . Serious difficulty catching your breath . Any worsening symptoms from Zone 2  Preterm Labor and Birth Information  The normal length of a pregnancy is 39-41 weeks. Preterm labor is when labor starts before 37 completed weeks of pregnancy. What are the risk factors for preterm labor? Preterm labor is more likely to occur in women who:  Have certain infections during pregnancy such as a bladder infection, sexually transmitted infection, or infection inside the uterus (chorioamnionitis).  Have a shorter-than-normal cervix.  Have gone into preterm  labor before.  Have had surgery on their cervix.  Are younger than age 69 or older than age 11.  Are African American.  Are pregnant with twins or multiple babies (multiple gestation).  Take street drugs or smoke while pregnant.  Do not gain enough weight while pregnant.  Became pregnant shortly after having been pregnant. What are the symptoms of preterm labor? Symptoms of preterm labor include:  Cramps similar to those that can happen during a menstrual period. The cramps may happen with diarrhea.  Pain in the abdomen or lower back.  Regular uterine contractions that may feel like tightening of the abdomen.  A feeling of increased pressure in the pelvis.  Increased watery or bloody mucus discharge from the vagina.  Water breaking (ruptured amniotic sac). Why is it important to recognize signs of preterm labor? It is important to recognize signs of preterm labor because babies who are born prematurely may not be fully developed. This can put them at an increased risk for:  Long-term (chronic) heart and lung problems.  Difficulty immediately after birth with regulating body systems, including blood sugar, body temperature, heart rate, and breathing rate.  Bleeding in the brain.  Cerebral palsy.  Learning difficulties.  Death. These risks are highest for babies who are born before 41 weeks of pregnancy. How is preterm labor treated? Treatment depends on the length of your pregnancy, your condition, and the health of your baby. It may involve: 1. Having a stitch (suture) placed in your cervix to prevent your cervix from opening too early (cerclage). 2. Taking or being given medicines, such as: ? Hormone medicines. These may be given early in pregnancy to help support the pregnancy. ? Medicine to stop contractions. ? Medicines to help mature the baby's lungs. These may be prescribed if the risk of delivery is high. ? Medicines to prevent your baby from developing  cerebral palsy. If the labor happens before 34 weeks of pregnancy, you may need to stay in the hospital. What should I do if I think I am in preterm labor? If you think that you are going into preterm labor, call your health care provider right away. How can I prevent preterm labor in future pregnancies? To increase your chance of having a full-term pregnancy:  Do not use any tobacco products, such as cigarettes, chewing tobacco, and e-cigarettes. If you need help quitting, ask your health care provider.  Do not use street drugs or medicines that have not been prescribed to you during your pregnancy.  Talk with your health care provider before taking any herbal supplements, even if you have been taking them regularly.  Make sure you gain a healthy amount of weight during your pregnancy.  Watch for infection. If you think that you might have an infection, get it checked right away.  Make sure to tell  your health care provider if you have gone into preterm labor before. This information is not intended to replace advice given to you by your health care provider. Make sure you discuss any questions you have with your health care provider. Document Revised: 11/01/2018 Document Reviewed: 12/01/2015 Elsevier Patient Education  [place].

## 2020-09-10 NOTE — Progress Notes (Signed)
Work-in LOW-RISK PREGNANCY VISIT Patient name: Leah Kennedy MRN [id number]  Date of birth: 16-Nov-1995 Chief Complaint:   Non-stress Test  History of Present Illness:   Vienna Folden is a 25 y.o. G70P1011 female at [redacted]w[redacted]d with an Estimated Date of Delivery: 10/28/20 being seen today for ongoing management of a low-risk pregnancy.  Depression screen [facility] 2/9 04/21/2020 11/19/2019 07/01/2018  Decreased Interest 0 2 1  Down, Depressed, Hopeless 0 0 0  PHQ - 2 Score 0 2 1  Altered sleeping 2 1 2   Tired, decreased energy 2 2 1   Change in appetite 2 2 1   Feeling bad or failure about yourself  0 1 0  Trouble concentrating 0 0 0  Moving slowly or fidgety/restless 0 0 0  Suicidal thoughts 0 0 0  PHQ-9 Score 6 8 5   Difficult doing work/chores - Not difficult at all -    Today she is being seen as a work-in for report of pelvic pressure, increased discharge and decreased fm. Contractions: Not present.  .  Movement: (!) Decreased. denies leaking of fluid. Review of Systems:   Pertinent items are noted in HPI Denies abnormal vaginal discharge w/ itching/odor/irritation, headaches, visual changes, shortness of breath, chest pain, abdominal pain, severe nausea/vomiting, or problems with urination or bowel movements unless otherwise stated above. Pertinent History Reviewed:  Reviewed past medical,surgical, social, obstetrical and family history.  Reviewed problem list, medications and allergies. Physical Assessment:   Vitals:   09/10/20 0857  BP: 111/68  Pulse: 72  Weight: 298 lb (135.2 kg)  Body mass index is 47.38 kg/m.        Physical Examination:   General appearance: Well appearing, and in no distress  Mental status: Alert, oriented to person, place, and time  Skin: Warm & dry  Cardiovascular: Normal heart rate noted  Respiratory: Normal respiratory effort, no distress  Abdomen: Soft, gravid, nontender  Pelvic: spec exam: cx visually long/closed, small amt thin white d/c , SVE  deferred        Extremities: Edema: None  Chaperone: [name]    NST: FHR baseline 120 bpm, Variability: moderate, Accelerations:present, Decelerations:  Absent= Cat 1/Reactive Toco: none   Results for orders placed or performed in visit on 09/10/20 (from the past 24 hour(s))  POC Urinalysis Dipstick OB   Collection Time: 09/10/20 10:11 AM  Result Value Ref Range   Color, UA     Clarity, UA     Glucose, UA Negative Negative   Bilirubin, UA     Ketones, UA neg    Spec Grav, UA     Blood, UA neg    pH, UA     POC,PROTEIN,UA Trace Negative, Trace, Small (1+), Moderate (2+), Large (3+), 4+   Urobilinogen, UA     Nitrite, UA neg    Leukocytes, UA Negative Negative   Appearance     Odor      Assessment & Plan:  1) Low-risk pregnancy G3P1011 at [redacted]w[redacted]d with an Estimated Date of Delivery: 10/28/20   2) Pressure, cx long/closed on visual exam, reviewed ptl s/s, reasons to seek care  3) BV> on quick view of slide, CV swab sent, Rx metronidazole 500mg  BID x 7d for BV, no sex while taking   4) Decreased fm> reactive NST, reviewed [facility]   Meds:  Meds ordered this encounter  Medications  . metroNIDAZOLE (FLAGYL) 500 MG tablet    Sig: Take 1 tablet (500 mg total) by mouth 2 (two) times daily.  Dispense:  14 tablet    Refill:  0    Order Specific Question:   Supervising Provider    Answer:   Lazaro Arms [[id number]]   Labs/procedures today: spec exam and CV swab, nst  Plan:  Continue routine obstetrical care  Next visit: prefers in person    Reviewed: Preterm labor symptoms and general obstetric precautions including but not limited to vaginal bleeding, contractions, leaking of fluid and fetal movement were reviewed in detail with the patient.  All questions were answered. Has home bp cuff.  Check bp weekly, let us know if >140/90.   Follow-up: Return for As scheduled 1wk LROB.  Future Appointments  Date Time Provider Department Center  09/10/2020 11:10 AM Cheral Marker, CNM  [facility]  09/17/2020 11:20 AM [name], CNM [facility]    Orders Placed This Encounter  Procedures  . POC Urinalysis Dipstick OB   Cheral Marker CNM, [facility] 09/10/2020 10:24 AM

## 2020-09-10 NOTE — Telephone Encounter (Signed)
Patient states she has decreased fetal movement since 1pm yesterday.  States she has felt a little something last night but none since.  She is also having an increase in pressure.  Will have patient come in for NST.

## 2020-09-14 ENCOUNTER — Other Ambulatory Visit: Payer: Self-pay | Admitting: Women's Health

## 2020-09-14 LAB — CERVICOVAGINAL ANCILLARY ONLY
Bacterial Vaginitis (gardnerella): POSITIVE — AB
Candida Glabrata: NEGATIVE
Candida Vaginitis: NEGATIVE
Chlamydia: NEGATIVE
Comment: NEGATIVE
Comment: NEGATIVE
Comment: NEGATIVE
Comment: NEGATIVE
Comment: NEGATIVE
Comment: NORMAL
Neisseria Gonorrhea: NEGATIVE
Trichomonas: NEGATIVE

## 2020-09-17 ENCOUNTER — Ambulatory Visit (INDEPENDENT_AMBULATORY_CARE_PROVIDER_SITE_OTHER): Payer: Medicaid Other | Admitting: Advanced Practice Midwife

## 2020-09-17 ENCOUNTER — Other Ambulatory Visit: Payer: Self-pay

## 2020-09-17 VITALS — BP 112/74 | HR 72 | Wt 298.0 lb

## 2020-09-17 DIAGNOSIS — A749 Chlamydial infection, unspecified: Secondary | ICD-10-CM

## 2020-09-17 DIAGNOSIS — Z3480 Encounter for supervision of other normal pregnancy, unspecified trimester: Secondary | ICD-10-CM

## 2020-09-17 DIAGNOSIS — F1290 Cannabis use, unspecified, uncomplicated: Secondary | ICD-10-CM

## 2020-09-17 DIAGNOSIS — Z3A34 34 weeks gestation of pregnancy: Secondary | ICD-10-CM

## 2020-09-17 NOTE — Progress Notes (Signed)
   LOW-RISK PREGNANCY VISIT Patient name: Leah Kennedy MRN [id number]  Date of birth: 02/27/1996 Chief Complaint:   Routine Prenatal Visit  History of Present Illness:   Leah Kennedy is a 25 y.o. G45P1011 female at 97w1dwith an Estimated Date of Delivery: 10/28/20 being seen today for ongoing management of a low-risk pregnancy.  Today she reports doing well but tired; [place] to do hydrotherapy in labor but probably not deliver in tub. Contractions: Not present. Vag. Bleeding: None.  Movement: Present. denies leaking of fluid. Review of Systems:   Pertinent items are noted in HPI Denies abnormal vaginal discharge w/ itching/odor/irritation, headaches, visual changes, shortness of breath, chest pain, abdominal pain, severe nausea/vomiting, or problems with urination or bowel movements unless otherwise stated above. Pertinent History Reviewed:  Reviewed past medical,surgical, social, obstetrical and family history.  Reviewed problem list, medications and allergies. Physical Assessment:   Vitals:   09/17/20 1124  BP: 112/74  Pulse: 72  Weight: 298 lb (135.2 kg)  Body mass index is 47.38 kg/m.        Physical Examination:   General appearance: Well appearing, and in no distress  Mental status: Alert, oriented to person, place, and time  Skin: Warm & dry  Cardiovascular: Normal heart rate noted  Respiratory: Normal respiratory effort, no distress  Abdomen: Soft, gravid, nontender  Pelvic: Cervical exam deferred         Extremities: Edema: None  Fetal Status: Fetal Heart Rate (bpm): 158 Fundal Height: 34 cm Movement: Present    No results found for this or any previous visit (from the past 24 hour(s)).  Assessment & Plan:  1) Low-risk pregnancy G3P1011 at 387w1dith an Estimated Date of Delivery: 10/28/20   2) +THC at new ob, UDS today  3) Rubella nonimmune, recommend MMR vax PP  3) Interested in hydrotherapy, WB consent signed today (class  certificate under media tab)   Meds: No orders of the defined types were placed in this encounter.  Labs/procedures today: none  Plan:  Continue routine obstetrical care   Reviewed: Preterm labor symptoms and general obstetric precautions including but not limited to vaginal bleeding, contractions, leaking of fluid and fetal movement were reviewed in detail with the patient.  All questions were answered. Has home bp cuff.  Check bp weekly, let us[place]oreanow if >140/90.   Follow-up: Return in about 2 weeks (around 10/01/2020) [place] GBS & cultures.  Orders Placed This Encounter  Procedures  . Pain Management Screening Profile (10S)   KiMyrtis SerNAsc[facility]/25/2022 11:51 AM

## 2020-09-17 NOTE — Patient Instructions (Signed)
Leah Kennedy, I greatly value your feedback.  If you receive a survey following your visit with [place] today, we appreciate you taking the time to fill it out.  Thanks, Philipp Deputy, CNM   [facility] ([address] [place], [place] [address]) Entrance C, located off of [address] valet parking  Go to [email or web address] to register for FREE online childbirth classes   Call the office [phone]) or go to [facility] if:  You begin to have strong, frequent contractions  Your water breaks.  Sometimes it is a big gush of fluid, sometimes it is just a trickle that keeps getting your panties wet or running down your legs  You have vaginal bleeding.  It is normal to have a small amount of spotting if your cervix was checked.   You don't feel your baby moving like normal.  If you don't, get you something to eat and drink and lay down and focus on feeling your baby move.  You should feel at least 10 movements in 2 hours.  If you don't, you should call the office or go to [facility].    Tdap Vaccine  It is recommended that you get the Tdap vaccine during the third trimester of EACH pregnancy to help protect your baby from getting pertussis (whooping cough)  27-36 weeks is the BEST time to do this so that you can pass the protection on to your baby. During pregnancy is better than after pregnancy, but if you are unable to get it during pregnancy it will be offered at the hospital.   You can get this vaccine with [place], at the health department, your family doctor, or some local pharmacies  Everyone who will be around your baby should also be up-to-date on their vaccines before the baby comes. Adults (who are not pregnant) only need 1 dose of Tdap during adulthood.    Pediatricians/Family Doctors:  [facility] [phone]            [facility] [phone]                 [facility] [phone]  (usually not accepting new patients unless you have family there already, you are always welcome to call and ask)       [facility] Department [phone]       [facility] Pediatricians/Family Doctors:   [facility]: [phone]  [facility]: [phone]  [facility]: [phone]  [facility] Doctors:   [facility]: [phone]   [facility]: [phone]  [facility] Doctors:  [facility]: [phone]   Home Blood Pressure Monitoring for Patients   Your provider has recommended that you check your blood pressure (BP) at least once a week at home. If you do not have a blood pressure cuff at home, one will be provided for you. Contact your provider if you have not received your monitor within 1 week.   Helpful Tips for Accurate Home Blood Pressure Checks  . Don't smoke, exercise, or drink caffeine 30 minutes before checking your BP . Use the restroom before checking your BP (a full bladder can raise your pressure) . Relax in a comfortable upright chair . Feet on the ground . Left arm resting comfortably on a flat surface at the level of your heart . Legs uncrossed . Back supported . Sit quietly and don't talk . Place the cuff on your bare arm .  Adjust snuggly, so that only two fingertips can fit between your skin and the top of the cuff . Check 2 readings separated by at least one minute . Keep a log of your BP readings . For a visual, please reference this diagram: http://ccnc.care/bpdiagram  Provider Name: [facility]     Phone: [phone]  Zone 1: ALL CLEAR  Continue to monitor your symptoms:  . BP reading is less than 140 (top number) or less than 90 (bottom number)  . No right upper stomach pain . No headaches or seeing spots . No feeling nauseated or throwing up . No swelling in face and hands  Zone 2: CAUTION Call your doctor's office for  any of the following:  . BP reading is greater than 140 (top number) or greater than 90 (bottom number)  . Stomach pain under your ribs in the middle or right side . Headaches or seeing spots . Feeling nauseated or throwing up . Swelling in face and hands  Zone 3: EMERGENCY  Seek immediate medical care if you have any of the following:  . BP reading is greater than160 (top number) or greater than 110 (bottom number) . Severe headaches not improving with Tylenol . Serious difficulty catching your breath . Any worsening symptoms from Zone 2   Third Trimester of Pregnancy The third trimester is from week 29 through week 42, months 7 through 9. The third trimester is a time when the fetus is growing rapidly. At the end of the ninth month, the fetus is about 20 inches in length and weighs 6-10 pounds.  BODY CHANGES Your body goes through many changes during pregnancy. The changes vary from woman to woman.   Your weight will continue to increase. You can expect to gain 25-35 pounds (11-16 kg) by the end of the pregnancy.  You may begin to get stretch marks on your hips, abdomen, and breasts.  You may urinate more often because the fetus is moving lower into your pelvis and pressing on your bladder.  You may develop or continue to have heartburn as a result of your pregnancy.  You may develop constipation because certain hormones are causing the muscles that push waste through your intestines to slow down.  You may develop hemorrhoids or swollen, bulging veins (varicose veins).  You may have pelvic pain because of the weight gain and pregnancy hormones relaxing your joints between the bones in your pelvis. Backaches may result from overexertion of the muscles supporting your posture.  You may have changes in your hair. These can include thickening of your hair, rapid growth, and changes in texture. Some women also have hair loss during or after pregnancy, or hair that feels dry or thin.  Your hair will most likely return to normal after your baby is born.  Your breasts will continue to grow and be tender. A yellow discharge may leak from your breasts called colostrum.  Your belly button may stick out.  You may feel short of breath because of your expanding uterus.  You may notice the fetus "dropping," or moving lower in your abdomen.  You may have a bloody mucus discharge. This usually occurs a few days to a week before labor begins.  Your cervix becomes thin and soft (effaced) near your due date. WHAT TO EXPECT AT YOUR PRENATAL EXAMS  You will have prenatal exams every 2 weeks until week 36. Then, you will have weekly prenatal exams. During a routine prenatal visit:  You will be  weighed to make sure you and the fetus are growing normally.  Your blood pressure is taken.  Your abdomen will be measured to track your baby's growth.  The fetal heartbeat will be listened to.  Any test results from the previous visit will be discussed.  You may have a cervical check near your due date to see if you have effaced. At around 36 weeks, your caregiver will check your cervix. At the same time, your caregiver will also perform a test on the secretions of the vaginal tissue. This test is to determine if a type of bacteria, Group B streptococcus, is present. Your caregiver will explain this further. Your caregiver may ask you:  What your birth plan is.  How you are feeling.  If you are feeling the baby move.  If you have had any abnormal symptoms, such as leaking fluid, bleeding, severe headaches, or abdominal cramping.  If you have any questions. Other tests or screenings that may be performed during your third trimester include:  Blood tests that check for low iron levels (anemia).  Fetal testing to check the health, activity level, and growth of the fetus. Testing is done if you have certain medical conditions or if there are problems during the pregnancy. FALSE  LABOR You may feel small, irregular contractions that eventually go away. These are called Braxton Hicks contractions, or false labor. Contractions may last for hours, days, or even weeks before true labor sets in. If contractions come at regular intervals, intensify, or become painful, it is best to be seen by your caregiver.  SIGNS OF LABOR   Menstrual-like cramps.  Contractions that are 5 minutes apart or less.  Contractions that start on the top of the uterus and spread down to the lower abdomen and back.  A sense of increased pelvic pressure or back pain.  A watery or bloody mucus discharge that comes from the vagina. If you have any of these signs before the 37th week of pregnancy, call your caregiver right away. You need to go to the hospital to get checked immediately. HOME CARE INSTRUCTIONS   Avoid all smoking, herbs, alcohol, and unprescribed drugs. These chemicals affect the formation and growth of the baby.  Follow your caregiver's instructions regarding medicine use. There are medicines that are either safe or unsafe to take during pregnancy.  Exercise only as directed by your caregiver. Experiencing uterine cramps is a good sign to stop exercising.  Continue to eat regular, healthy meals.  Wear a good support bra for breast tenderness.  Do not use hot tubs, steam rooms, or saunas.  Wear your seat belt at all times when driving.  Avoid raw meat, uncooked cheese, cat litter boxes, and soil used by cats. These carry germs that can cause birth defects in the baby.  Take your prenatal vitamins.  Try taking a stool softener (if your caregiver approves) if you develop constipation. Eat more high-fiber foods, such as fresh vegetables or fruit and whole grains. Drink plenty of fluids to keep your urine clear or pale yellow.  Take warm sitz baths to soothe any pain or discomfort caused by hemorrhoids. Use hemorrhoid cream if your caregiver approves.  If you develop varicose  veins, wear support hose. Elevate your feet for 15 minutes, 3-4 times a day. Limit salt in your diet.  Avoid heavy lifting, wear low heal shoes, and practice good posture.  Rest a lot with your legs elevated if you have leg cramps or low back pain.  Visit your dentist if you have not gone during your pregnancy. Use a soft toothbrush to brush your teeth and be gentle when you floss.  A sexual relationship may be continued unless your caregiver directs you otherwise.  Do not travel far distances unless it is absolutely necessary and only with the approval of your caregiver.  Take prenatal classes to understand, practice, and ask questions about the labor and delivery.  Make a trial run to the hospital.  Pack your hospital bag.  Prepare the baby's nursery.  Continue to go to all your prenatal visits as directed by your caregiver. SEEK MEDICAL CARE IF:  You are unsure if you are in labor or if your water has broken.  You have dizziness.  You have mild pelvic cramps, pelvic pressure, or nagging pain in your abdominal area.  You have persistent nausea, vomiting, or diarrhea.  You have a bad smelling vaginal discharge.  You have pain with urination. SEEK IMMEDIATE MEDICAL CARE IF:   You have a fever.  You are leaking fluid from your vagina.  You have spotting or bleeding from your vagina.  You have severe abdominal cramping or pain.  You have rapid weight loss or gain.  You have shortness of breath with chest pain.  You notice sudden or extreme swelling of your face, hands, ankles, feet, or legs.  You have not felt your baby move in over an hour.  You have severe headaches that do not go away with medicine.  You have vision changes. Document Released: 07/04/2001 Document Revised: 07/15/2013 Document Reviewed: 09/10/2012 [facility] Patient Information 2015 [place], [place]. This information is not intended to replace advice given to you by your health care provider. Make  sure you discuss any questions you have with your health care provider.

## 2020-10-04 ENCOUNTER — Other Ambulatory Visit (HOSPITAL_COMMUNITY)
Admission: RE | Admit: 2020-10-04 | Discharge: 2020-10-04 | Disposition: A | Discharge status: Home / Self Care | Payer: Medicaid Other | Source: Ambulatory Visit | Attending: Obstetrics & Gynecology | Admitting: Obstetrics & Gynecology

## 2020-10-04 ENCOUNTER — Encounter: Payer: Self-pay | Admitting: Obstetrics & Gynecology

## 2020-10-04 ENCOUNTER — Ambulatory Visit: Payer: Medicaid Other | Admitting: Obstetrics & Gynecology

## 2020-10-04 ENCOUNTER — Other Ambulatory Visit: Payer: Self-pay

## 2020-10-04 VITALS — BP 134/88 | HR 103 | Wt 298.6 lb

## 2020-10-04 DIAGNOSIS — Z3483 Encounter for supervision of other normal pregnancy, third trimester: Secondary | ICD-10-CM | POA: Insufficient documentation

## 2020-10-04 DIAGNOSIS — Z3A36 36 weeks gestation of pregnancy: Secondary | ICD-10-CM

## 2020-10-04 NOTE — Progress Notes (Signed)
   LOW-RISK PREGNANCY VISIT Patient name: Leah Kennedy MRN [id number]  Date of birth: 16-Dec-1995 Chief Complaint:   Routine Prenatal Visit  History of Present Illness:   Leah Kennedy is a 25 y.o. G45P1011 female at [redacted]w[redacted]d with an Estimated Date of Delivery: 10/28/20 being seen today for ongoing management of a low-risk pregnancy.   Depression screen [facility] 2/9 04/21/2020 11/19/2019 07/01/2018  Decreased Interest 0 2 1  Down, Depressed, Hopeless 0 0 0  PHQ - 2 Score 0 2 1  Altered sleeping 2 1 2   Tired, decreased energy 2 2 1   Change in appetite 2 2 1   Feeling bad or failure about yourself  0 1 0  Trouble concentrating 0 0 0  Moving slowly or fidgety/restless 0 0 0  Suicidal thoughts 0 0 0  PHQ-9 Score 6 8 5   Difficult doing work/chores - Not difficult at all -    Today she reports occasional contractions. Contractions: only occasional and some pelvic pressure, denies regular painful contractions. Vag. Bleeding: None.  Movement: Present. denies leaking of fluid. Review of Systems:   Pertinent items are noted in HPI Denies abnormal vaginal discharge w/ itching/odor/irritation, headaches, visual changes, shortness of breath, chest pain, abdominal pain, severe nausea/vomiting, or problems with urination or bowel movements unless otherwise stated above. Pertinent History Reviewed:  Reviewed past medical,surgical, social, obstetrical and family history.  Reviewed problem list, medications and allergies.  Physical Assessment:   Vitals:   10/04/20 1140  BP: 134/88  Pulse: (!) 103  Weight: 298 lb 9.6 oz (135.4 kg)  Body mass index is 47.47 kg/m.        Physical Examination:   General appearance: Well appearing, and in no distress  Mental status: Alert, oriented to person, place, and time  Skin: Warm & dry  Respiratory: Normal respiratory effort, no distress  Abdomen: Soft, gravid, nontender  Pelvic: Cervical exam performed  Dilation: Closed Effacement (%): Thick Station:  Ballotable  Extremities: Edema: None  Psych:  mood and affect appropriate  Fetal Status: Fetal Heart Rate (bpm): 145 Fundal Height: 37 cm Movement: Present Presentation: Vertex  Chaperone: pt declined chaperone    No results found for this or any previous visit (from the past 24 hour(s)).   Assessment & Plan:  1) Low-risk pregnancy G3P1011 at [redacted]w[redacted]d with an Estimated Date of Delivery: 10/28/20   2) Morbid obesity- BMI- 47  Plan for waterbirth   Meds: No orders of the defined types were placed in this encounter.  Labs/procedures today: GBS, GC/C collected  Plan:  Continue routine obstetrical care Next visit: prefers in person    Reviewed: Term labor symptoms and general obstetric precautions including but not limited to vaginal bleeding, contractions, leaking of fluid and fetal movement were reviewed in detail with the patient.  All questions were answered. Pt has  home bp cuff. Check bp weekly, let know if >140/90.   Follow-up: Return in about 1 week (around 10/11/2020) for LROB visit.  Orders Placed This Encounter  Procedures  . Culture, beta strep (group b only)    [redacted]w[redacted]d, DO Attending Obstetrician & Gynecologist, [facility] for [place], [facility]

## 2020-10-05 LAB — CERVICOVAGINAL ANCILLARY ONLY
Chlamydia: NEGATIVE
Comment: NEGATIVE
Comment: NORMAL
Neisseria Gonorrhea: NEGATIVE

## 2020-10-07 ENCOUNTER — Telehealth: Payer: Self-pay | Admitting: Women's Health

## 2020-10-07 LAB — CULTURE, BETA STREP (GROUP B ONLY): Strep Gp B Culture: POSITIVE — AB

## 2020-10-07 NOTE — Telephone Encounter (Signed)
Patient called stating that she just got her results back and it is showing that she tested positive for strep group B and she would like to know if she could give birth Naturally and if we could call her in something for it. Patient states that she uses [facility] in [place] as her pharmacy. Please contact pt

## 2020-10-07 NOTE — Telephone Encounter (Signed)
Returned pt's call. Informed pt that having strep B would not affect her delivery plans and she would be given IV antibiotics at the hospital when she goes into labor. Pt was reassured that she didn't do anything "wrong" as she was thinking. Pt confirms understanding.

## 2020-10-13 ENCOUNTER — Ambulatory Visit: Payer: Medicaid Other | Admitting: Obstetrics & Gynecology

## 2020-10-13 ENCOUNTER — Other Ambulatory Visit: Payer: Self-pay

## 2020-10-13 ENCOUNTER — Encounter: Payer: Self-pay | Admitting: Obstetrics & Gynecology

## 2020-10-13 VITALS — BP 124/71 | HR 93 | Wt 303.0 lb

## 2020-10-13 DIAGNOSIS — Z3480 Encounter for supervision of other normal pregnancy, unspecified trimester: Secondary | ICD-10-CM

## 2020-10-13 NOTE — Progress Notes (Signed)
   LOW-RISK PREGNANCY VISIT Patient name: Leah Kennedy MRN [id number]  Date of birth: 1996-04-11 Chief Complaint:   Routine Prenatal Visit  History of Present Illness:   Leah Kennedy is a 25 y.o. G74P1011 female at [redacted]w[redacted]d with an Estimated Date of Delivery: 10/28/20 being seen today for ongoing management of a low-risk pregnancy.  Depression screen [facility] 2/9 04/21/2020 11/19/2019 07/01/2018  Decreased Interest 0 2 1  Down, Depressed, Hopeless 0 0 0  PHQ - 2 Score 0 2 1  Altered sleeping 2 1 2   Tired, decreased energy 2 2 1   Change in appetite 2 2 1   Feeling bad or failure about yourself  0 1 0  Trouble concentrating 0 0 0  Moving slowly or fidgety/restless 0 0 0  Suicidal thoughts 0 0 0  PHQ-9 Score 6 8 5   Difficult doing work/chores - Not difficult at all -    Today she reports no complaints. Contractions: Not present. Vag. Bleeding: None.  Movement: Present. denies leaking of fluid. Review of Systems:   Pertinent items are noted in HPI Denies abnormal vaginal discharge w/ itching/odor/irritation, headaches, visual changes, shortness of breath, chest pain, abdominal pain, severe nausea/vomiting, or problems with urination or bowel movements unless otherwise stated above. Pertinent History Reviewed:  Reviewed past medical,surgical, social, obstetrical and family history.  Reviewed problem list, medications and allergies.  Physical Assessment:   Vitals:   10/13/20 0917  BP: 124/71  Pulse: 93  Weight: (!) 303 lb (137.4 kg)  Body mass index is 48.17 kg/m.        Physical Examination:   General appearance: Well appearing, and in no distress  Mental status: Alert, oriented to person, place, and time  Skin: Warm & dry  Respiratory: Normal respiratory effort, no distress  Abdomen: Soft, gravid, nontender  Pelvic: Cervical exam deferred         Extremities: Edema: None  Psych:  mood and affect appropriate  Fetal Status: Fetal Heart Rate (bpm): 140 Fundal Height: 38 cm Movement:  Present    Chaperone: n/a    No results found for this or any previous visit (from the past 24 hour(s)).   Assessment & Plan:  1) Low-risk pregnancy G3P1011 at 102w6d with an Estimated Date of Delivery: 10/28/20  -morbid obesity: BMI 48  2) GBS pos- reviewed Abx in labor, discussed her concerns regarding arrival to hospital  Briefly discussed IOL @ 41wks if she has not gone into labor on her own   Meds: No orders of the defined types were placed in this encounter.  Labs/procedures today: none  Plan:  Continue routine obstetrical care Next visit: prefers in person    Reviewed: Term labor symptoms and general obstetric precautions including but not limited to vaginal bleeding, contractions, leaking of fluid and fetal movement were reviewed in detail with the patient.  All questions were answered. Pt has home bp cuff. Check bp weekly, let know if >140/90.   Follow-up: Return in about 1 week (around 10/20/2020) for LROB visit.  No orders of the defined types were placed in this encounter.   [redacted]w[redacted]d, DO Attending Obstetrician & Gynecologist, [facility] for [place], [facility]

## 2020-10-17 ENCOUNTER — Inpatient Hospital Stay (EMERGENCY_DEPARTMENT_HOSPITAL)
Admission: AD | Admit: 2020-10-17 | Discharge: 2020-10-17 | Disposition: A | Discharge status: Home / Self Care | Payer: Medicaid Other | Source: Home / Self Care | Attending: Obstetrics & Gynecology | Admitting: Obstetrics & Gynecology

## 2020-10-17 ENCOUNTER — Encounter (HOSPITAL_COMMUNITY): Payer: Self-pay | Admitting: Obstetrics & Gynecology

## 2020-10-17 ENCOUNTER — Other Ambulatory Visit: Payer: Self-pay

## 2020-10-17 DIAGNOSIS — O26893 Other specified pregnancy related conditions, third trimester: Secondary | ICD-10-CM

## 2020-10-17 DIAGNOSIS — O24113 Pre-existing diabetes mellitus, type 2, in pregnancy, third trimester: Secondary | ICD-10-CM | POA: Insufficient documentation

## 2020-10-17 DIAGNOSIS — O471 False labor at or after 37 completed weeks of gestation: Secondary | ICD-10-CM | POA: Insufficient documentation

## 2020-10-17 DIAGNOSIS — N898 Other specified noninflammatory disorders of vagina: Secondary | ICD-10-CM | POA: Insufficient documentation

## 2020-10-17 DIAGNOSIS — O479 False labor, unspecified: Secondary | ICD-10-CM

## 2020-10-17 DIAGNOSIS — O368130 Decreased fetal movements, third trimester, not applicable or unspecified: Secondary | ICD-10-CM

## 2020-10-17 DIAGNOSIS — Z87891 Personal history of nicotine dependence: Secondary | ICD-10-CM | POA: Insufficient documentation

## 2020-10-17 DIAGNOSIS — Z3A38 38 weeks gestation of pregnancy: Secondary | ICD-10-CM | POA: Insufficient documentation

## 2020-10-17 LAB — URINALYSIS, ROUTINE W REFLEX MICROSCOPIC
Bilirubin Urine: NEGATIVE
Glucose, UA: NEGATIVE mg/dL
Ketones, ur: NEGATIVE mg/dL
Nitrite: NEGATIVE
Protein, ur: NEGATIVE mg/dL
Specific Gravity, Urine: 1.018 (ref 1.005–1.030)
pH: 6 (ref 5.0–8.0)

## 2020-10-17 NOTE — MAU Provider Note (Signed)
Chief Complaint:  Rupture of Membranes and Decreased Fetal Movement   None     HPI: Leah Kennedy is a 25 y.o. G3P1011 at [redacted]w[redacted]d by early ultrasound who presents to maternity admissions reporting pink mucous discharge, decreased fetal movement, and irregular contractions today.  She reports seeing pink mucous multiple times when wiping today, requiring a pantyliner but not enough to soak a pad.  There is no bright red bleeding.  Cramping She reports good fetal movement, denies LOF, vaginal bleeding, vaginal itching/burning, urinary symptoms, h/a, dizziness, n/v, or fever/chills.     Location: lower abdomen Quality: cramping Severity: 5/10 on pain scale Duration: 1 day Timing: intermittent, irregular, 4-5 times per hour Modifying factors: none Associated signs and symptoms: decreased fetal movement and pink mucous discharge.  HPI  Past Medical History: Past Medical History:  Diagnosis Date  . Depression   . Diabetes mellitus without complication (HCC)    pre-diabetes    Past obstetric history: OB History  Gravida Para Term Preterm AB Living  3 1 1   1 1   SAB IAB Ectopic Multiple Live Births  1     0 1    # Outcome Date GA Lbr Len/2nd Weight Sex Delivery Anes PTL Lv  3 Current           2 Term 01/17/19 [redacted]w[redacted]d 10:35 / 00:18 3340 g M Vag-Spont EPI N LIV  1 SAB 05/2017            Past Surgical History: Past Surgical History:  Procedure Laterality Date  . TONSILLECTOMY  2017    Family History: Family History  Problem Relation Age of Onset  . Stroke Father   . Alcohol abuse Father   . Pulmonary disease Maternal Grandmother   . Drug abuse Maternal Grandmother   . Alcohol abuse Maternal Grandfather   . Heart murmur Paternal Grandmother   . Hypertension Paternal Grandmother   . Diabetes Paternal Grandfather   . Gout Paternal Grandfather   . Kidney disease Paternal Grandfather   . Glaucoma Paternal Grandfather     Social History: Social History   Tobacco Use  .  Smoking status: Former 2018  . Smokeless tobacco: Never Used  Vaping Use  . Vaping Use: Never used  Substance Use Topics  . Alcohol use: No  . Drug use: Not Currently    Types: Marijuana    Comment: before pregnancy    Allergies:  Allergies  Allergen Reactions  . Carrot [Daucus Carota] Itching    Meds:  No medications prior to admission.    ROS:  Review of Systems  Constitutional: Negative for chills, fatigue and fever.  Eyes: Negative for visual disturbance.  Respiratory: Negative for shortness of breath.   Cardiovascular: Negative for chest pain.  Gastrointestinal: Positive for abdominal pain. Negative for nausea and vomiting.  Genitourinary: Positive for vaginal bleeding and vaginal discharge. Negative for difficulty urinating, dysuria, flank pain, pelvic pain and vaginal pain.  Neurological: Negative for dizziness and headaches.  Psychiatric/Behavioral: Negative.      I have reviewed patient's Past Medical Hx, Surgical Hx, Family Hx, Social Hx, medications and allergies.   Physical Exam   Patient Vitals for the past 24 hrs:  BP Temp Temp src Pulse Resp SpO2 Height  10/17/20 1354 135/66 - - 84 - - -  10/17/20 1344 136/76 98 F (36.7 C) Oral 85 17 99 % -  10/17/20 1335 - - - - - 99 % -  10/17/20 1330 - - - - -  100 % -  10/17/20 1325 - - - - - 97 % -  10/17/20 1320 - - - - - 98 % -  10/17/20 1250 - - - - - 99 % -  10/17/20 1245 - - - - - 98 % -  10/17/20 1240 - - - - - 98 % -  10/17/20 1235 - - - - - 98 % -  10/17/20 1230 - - - - - 100 % -  10/17/20 1225 - - - - - 99 % -  10/17/20 1220 - - - - - 98 % -  10/17/20 1215 - - - - - 98 % -  10/17/20 1210 115/70 98.1 F (36.7 C) Oral 92 18 98 % -  10/17/20 1205 - - - - - 98 % -  10/17/20 1200 - - - - - 97 % -  10/17/20 1159 120/61 - - (!) 105 - - -  10/17/20 1139 (!) 147/90 98.3 F (36.8 C) Oral 95 18 98 % 5\' 7"  (1.702 m)   Constitutional: Well-developed, well-nourished female in no acute distress.   Cardiovascular: normal rate Respiratory: normal effort GI: Abd soft, non-tender, gravid appropriate for gestational age.  MS: Extremities nontender, no edema, normal ROM Neurologic: Alert and oriented x 4.  GU: Neg CVAT.  PELVIC EXAM: Negative pooling of fluid with valsalva, no bleeding noted on exam  Dilation: 3 Effacement (%): 50 Cervical Position: Posterior Station: -2 Presentation: Vertex Exam by:: [email or web address]  FHT:  Baseline 125 , moderate variability, accelerations present, no decelerations Contractions: None on toco or to palpation   Labs: Results for orders placed or performed during the hospital encounter of 10/17/20 (from the past 24 hour(s))  Urinalysis, Routine w reflex microscopic Urine, Clean Catch     Status: Abnormal   Collection Time: 10/17/20 12:11 PM  Result Value Ref Range   Color, Urine YELLOW YELLOW   APPearance CLOUDY (A) CLEAR   Specific Gravity, Urine 1.018 1.005 - 1.030   pH 6.0 5.0 - 8.0   Glucose, UA NEGATIVE NEGATIVE mg/dL   Hgb urine dipstick MODERATE (A) NEGATIVE   Bilirubin Urine NEGATIVE NEGATIVE   Ketones, ur NEGATIVE NEGATIVE mg/dL   Protein, ur NEGATIVE NEGATIVE mg/dL   Nitrite NEGATIVE NEGATIVE   Leukocytes,Ua LARGE (A) NEGATIVE   RBC / HPF 11-20 0 - 5 RBC/hpf   WBC, UA 21-50 0 - 5 WBC/hpf   Bacteria, UA RARE (A) NONE SEEN   Squamous Epithelial / LPF 21-50 0 - 5   Mucus PRESENT    AB/Positive/-- (10/26 1132)  Imaging:  No results found.  MAU Course/MDM: Orders Placed This Encounter  Procedures  . Culture, OB Urine  . Urinalysis, Routine w reflex microscopic  . Discharge patient    No orders of the defined types were placed in this encounter.    NST reviewed and reactive Pt reported feeling normal fetal movement while in MAU No evidence of ROM or of active labor with no cervical change in 1+ hour in MAU and irregular contractions Single elevated BP on intake in MAU, other BP wnl.  No s/sx of PEC. Precautions  given. Watch BP in office.  Assessment: 1. Vaginal discharge during pregnancy in third trimester   2. Threatened labor at term   3. Decreased fetal movements in third trimester, single or unspecified fetus   4. [redacted] weeks gestation of pregnancy     Plan: Discharge home Labor precautions and fetal kick counts  Follow-up Information  [facility] Follow up.   Why: As scheduled 10/21/20, return to MAU with signs of labor or emergencies Contact information: [address] [place] [address] [phone]             Allergies as of 10/17/2020      Reactions   Carrot [daucus Carota] Itching      Medication List    TAKE these medications   pantoprazole 20 MG tablet Commonly known as: Protonix Take 1 tablet (20 mg total) by mouth daily.   prenatal vitamin w/FE, FA 27-1 MG Tabs tablet Take 1 tablet by mouth daily at 12 noon.       Sharen Counter Certified Nurse-Midwife 10/17/2020 2:18 PM

## 2020-10-17 NOTE — Discharge Instructions (Signed)
Things to Try After 37 weeks to Encourage Labor/Get Ready for Labor:   1.  Try the Miles Circuit at www.milescircuit.com daily to improve baby's position and encourage the onset of labor.  2. Walk a little and rest a little every day.  Change positions often.  3. Cervical Ripening: May try one or both a. Red Raspberry Leaf capsules or tea:  two 300mg or 400mg tablets with each meal, 2-3 times a day, or 1-3 cups of tea daily  Potential Side Effects Of Raspberry Leaf:  Most women do not experience any side effects from drinking raspberry leaf tea. However, nausea and loose stools are possible   b. Evening Primrose Oil capsules: take 1 capsule by mouth and place one capsule in the vagina every night.    Some of the potential side effects:  Upset stomach  Loose stools or diarrhea  Headaches  Nausea  4. Sex can also help the cervix ripen and encourage labor onset.    Reasons to return to MAU at [facility]:  1.  Contractions are  5 minutes apart or less, each last 1 minute, these have been going on for 1-2 hours, and you cannot walk or talk during them 2.  You have a large gush of fluid, or a trickle of fluid that will not stop and you have to wear a pad 3.  You have bleeding that is bright red, heavier than spotting--like menstrual bleeding (spotting can be normal in early labor or after a check of your cervix) 4.  You do not feel the baby moving like he/she normally does 

## 2020-10-17 NOTE — MAU Note (Signed)
Pt reports to mau with c/o possible SROM around 0830 this morning.  Pt also reports that she lost her mucous plug.  Denies reg ctx.  States she has not felt baby move yet to day.  FHR 130 in triage.

## 2020-10-18 ENCOUNTER — Encounter (HOSPITAL_COMMUNITY): Payer: Self-pay | Admitting: Obstetrics & Gynecology

## 2020-10-18 ENCOUNTER — Inpatient Hospital Stay (HOSPITAL_COMMUNITY)
Admission: AD | Admit: 2020-10-18 | Discharge: 2020-10-20 | DRG: 806 | Disposition: A | Discharge status: Home / Self Care | Payer: Medicaid Other | Attending: Family Medicine | Admitting: Family Medicine

## 2020-10-18 ENCOUNTER — Inpatient Hospital Stay (HOSPITAL_COMMUNITY): Payer: Medicaid Other | Admitting: Anesthesiology

## 2020-10-18 ENCOUNTER — Other Ambulatory Visit: Payer: Self-pay

## 2020-10-18 DIAGNOSIS — O99214 Obesity complicating childbirth: Secondary | ICD-10-CM | POA: Diagnosis present

## 2020-10-18 DIAGNOSIS — Z23 Encounter for immunization: Secondary | ICD-10-CM

## 2020-10-18 DIAGNOSIS — O471 False labor at or after 37 completed weeks of gestation: Secondary | ICD-10-CM | POA: Diagnosis not present

## 2020-10-18 DIAGNOSIS — Z3A38 38 weeks gestation of pregnancy: Secondary | ICD-10-CM | POA: Diagnosis not present

## 2020-10-18 DIAGNOSIS — Z20822 Contact with and (suspected) exposure to covid-19: Secondary | ICD-10-CM | POA: Diagnosis present

## 2020-10-18 DIAGNOSIS — O99824 Streptococcus B carrier state complicating childbirth: Secondary | ICD-10-CM | POA: Diagnosis not present

## 2020-10-18 DIAGNOSIS — Z2839 Other underimmunization status: Secondary | ICD-10-CM

## 2020-10-18 DIAGNOSIS — F1290 Cannabis use, unspecified, uncomplicated: Secondary | ICD-10-CM | POA: Diagnosis present

## 2020-10-18 DIAGNOSIS — Z87891 Personal history of nicotine dependence: Secondary | ICD-10-CM

## 2020-10-18 DIAGNOSIS — A749 Chlamydial infection, unspecified: Secondary | ICD-10-CM | POA: Diagnosis present

## 2020-10-18 DIAGNOSIS — N898 Other specified noninflammatory disorders of vagina: Secondary | ICD-10-CM | POA: Diagnosis not present

## 2020-10-18 DIAGNOSIS — O26893 Other specified pregnancy related conditions, third trimester: Secondary | ICD-10-CM | POA: Diagnosis not present

## 2020-10-18 DIAGNOSIS — O9902 Anemia complicating childbirth: Secondary | ICD-10-CM | POA: Diagnosis not present

## 2020-10-18 DIAGNOSIS — O99324 Drug use complicating childbirth: Secondary | ICD-10-CM | POA: Diagnosis present

## 2020-10-18 DIAGNOSIS — O368130 Decreased fetal movements, third trimester, not applicable or unspecified: Secondary | ICD-10-CM | POA: Diagnosis not present

## 2020-10-18 DIAGNOSIS — O99891 Other specified diseases and conditions complicating pregnancy: Secondary | ICD-10-CM

## 2020-10-18 DIAGNOSIS — D649 Anemia, unspecified: Secondary | ICD-10-CM | POA: Diagnosis not present

## 2020-10-18 LAB — CBC
HCT: 35.5 % — ABNORMAL LOW (ref 36.0–46.0)
Hemoglobin: 11.8 g/dL — ABNORMAL LOW (ref 12.0–15.0)
MCH: 27.1 pg (ref 26.0–34.0)
MCHC: 33.2 g/dL (ref 30.0–36.0)
MCV: 81.4 fL (ref 80.0–100.0)
Platelets: 335 10*3/uL (ref 150–400)
RBC: 4.36 MIL/uL (ref 3.87–5.11)
RDW: 14.7 % (ref 11.5–15.5)
WBC: 10.7 10*3/uL — ABNORMAL HIGH (ref 4.0–10.5)
nRBC: 0 % (ref 0.0–0.2)

## 2020-10-18 LAB — RPR: RPR Ser Ql: NONREACTIVE

## 2020-10-18 LAB — TYPE AND SCREEN
ABO/RH(D): AB POS
Antibody Screen: NEGATIVE

## 2020-10-18 LAB — CULTURE, OB URINE: Culture: <10000 — AB

## 2020-10-18 LAB — RESP PANEL BY RT-PCR (FLU A&B, COVID) ARPGX2
Influenza A by PCR: NEGATIVE
Influenza B by PCR: NEGATIVE
SARS Coronavirus 2 by RT PCR: NEGATIVE

## 2020-10-18 MED ORDER — LIDOCAINE HCL (PF) 1 % IJ SOLN
INTRAMUSCULAR | Status: DC | PRN
  Administered 2020-10-18: 3 mL via EPIDURAL
  Administered 2020-10-18: 2 mL via EPIDURAL
  Administered 2020-10-18: 5 mL via EPIDURAL

## 2020-10-18 MED ORDER — WITCH HAZEL-GLYCERIN EX PADS: 1.0000 "application " | MEDICATED_PAD | CUTANEOUS | Status: DC | PRN

## 2020-10-18 MED ORDER — IBUPROFEN 600 MG PO TABS
600.0000 mg | ORAL_TABLET | Freq: Four times a day (QID) | ORAL | Status: DC
  Administered 2020-10-18 – 2020-10-20 (×8): 600 mg via ORAL
  Filled 2020-10-18 (×8): qty 1

## 2020-10-18 MED ORDER — DIPHENHYDRAMINE HCL 50 MG/ML IJ SOLN: 12.5000 mg | INTRAMUSCULAR | Status: DC | PRN

## 2020-10-18 MED ORDER — OXYTOCIN-SODIUM CHLORIDE 30-0.9 UT/500ML-% IV SOLN
2.5000 [IU]/h | INTRAVENOUS | Status: DC
  Filled 2020-10-18: qty 500

## 2020-10-18 MED ORDER — LACTATED RINGERS IV SOLN
500.0000 mL | Freq: Once | INTRAVENOUS | Status: AC
  Administered 2020-10-18: 500 mL via INTRAVENOUS

## 2020-10-18 MED ORDER — OXYTOCIN BOLUS FROM INFUSION
333.0000 mL | Freq: Once | INTRAVENOUS | Status: DC
  Administered 2020-10-18: 333 mL via INTRAVENOUS

## 2020-10-18 MED ORDER — ZOLPIDEM TARTRATE 5 MG PO TABS: 5.0000 mg | ORAL_TABLET | Freq: Every evening | ORAL | Status: DC | PRN

## 2020-10-18 MED ORDER — FENTANYL CITRATE (PF) 100 MCG/2ML IJ SOLN: 100.0000 ug | INTRAMUSCULAR | Status: DC | PRN

## 2020-10-18 MED ORDER — LACTATED RINGERS IV SOLN: INTRAVENOUS | Status: DC

## 2020-10-18 MED ORDER — SENNOSIDES-DOCUSATE SODIUM 8.6-50 MG PO TABS
2.0000 | ORAL_TABLET | Freq: Every day | ORAL | Status: DC
  Administered 2020-10-19: 2 via ORAL
  Filled 2020-10-18 (×2): qty 2

## 2020-10-18 MED ORDER — COCONUT OIL OIL
1.0000 "application " | TOPICAL_OIL | Status: DC | PRN
  Administered 2020-10-18: 1 via TOPICAL

## 2020-10-18 MED ORDER — LACTATED RINGERS IV SOLN: 500.0000 mL | Freq: Once | INTRAVENOUS | Status: DC

## 2020-10-18 MED ORDER — ONDANSETRON HCL 4 MG PO TABS: 4.0000 mg | ORAL_TABLET | ORAL | Status: DC | PRN

## 2020-10-18 MED ORDER — SOD CITRATE-CITRIC ACID 500-334 MG/5ML PO SOLN: 30.0000 mL | ORAL | Status: DC | PRN

## 2020-10-18 MED ORDER — LIDOCAINE HCL (PF) 1 % IJ SOLN: 30.0000 mL | INTRAMUSCULAR | Status: DC | PRN

## 2020-10-18 MED ORDER — OXYCODONE-ACETAMINOPHEN 5-325 MG PO TABS: 1.0000 | ORAL_TABLET | ORAL | Status: DC | PRN

## 2020-10-18 MED ORDER — DIBUCAINE (PERIANAL) 1 % EX OINT: 1.0000 "application " | TOPICAL_OINTMENT | CUTANEOUS | Status: DC | PRN

## 2020-10-18 MED ORDER — FENTANYL-BUPIVACAINE-NACL 0.5-0.125-0.9 MG/250ML-% EP SOLN
12.0000 mL/h | EPIDURAL | Status: DC | PRN
Start: 2020-10-18 — End: 2020-10-18
  Administered 2020-10-18: 12 mL/h via EPIDURAL
  Filled 2020-10-18: qty 250

## 2020-10-18 MED ORDER — FENTANYL CITRATE (PF) 100 MCG/2ML IJ SOLN
INTRAMUSCULAR | Status: AC
  Administered 2020-10-18: 100 ug via INTRAVENOUS
  Filled 2020-10-18: qty 2

## 2020-10-18 MED ORDER — BENZOCAINE-MENTHOL 20-0.5 % EX AERO
1.0000 "application " | INHALATION_SPRAY | CUTANEOUS | Status: DC | PRN
  Administered 2020-10-18: 1 via TOPICAL
  Filled 2020-10-18: qty 56

## 2020-10-18 MED ORDER — PHENYLEPHRINE 40 MCG/ML (10ML) SYRINGE FOR IV PUSH (FOR BLOOD PRESSURE SUPPORT)
80.0000 ug | PREFILLED_SYRINGE | INTRAVENOUS | Status: DC | PRN
  Administered 2020-10-18 (×2): 80 ug via INTRAVENOUS

## 2020-10-18 MED ORDER — ACETAMINOPHEN 325 MG PO TABS: 650.0000 mg | ORAL_TABLET | ORAL | Status: DC | PRN

## 2020-10-18 MED ORDER — LACTATED RINGERS IV SOLN: 500.0000 mL | INTRAVENOUS | Status: DC | PRN

## 2020-10-18 MED ORDER — ONDANSETRON 4 MG PO TBDP
8.0000 mg | ORAL_TABLET | Freq: Once | ORAL | Status: AC
  Administered 2020-10-18: 8 mg via ORAL
  Filled 2020-10-18: qty 2

## 2020-10-18 MED ORDER — PENICILLIN G POT IN DEXTROSE 60000 UNIT/ML IV SOLN
3.0000 10*6.[IU] | INTRAVENOUS | Status: DC
  Administered 2020-10-18: 3 10*6.[IU] via INTRAVENOUS
  Filled 2020-10-18: qty 50

## 2020-10-18 MED ORDER — EPHEDRINE 5 MG/ML INJ: 10.0000 mg | INTRAVENOUS | Status: DC | PRN

## 2020-10-18 MED ORDER — ONDANSETRON HCL 4 MG/2ML IJ SOLN: 4.0000 mg | INTRAMUSCULAR | Status: DC | PRN

## 2020-10-18 MED ORDER — TETANUS-DIPHTH-ACELL PERTUSSIS 5-2.5-18.5 LF-MCG/0.5 IM SUSY: 0.5000 mL | PREFILLED_SYRINGE | Freq: Once | INTRAMUSCULAR | Status: DC

## 2020-10-18 MED ORDER — DIPHENHYDRAMINE HCL 25 MG PO CAPS: 25.0000 mg | ORAL_CAPSULE | Freq: Four times a day (QID) | ORAL | Status: DC | PRN

## 2020-10-18 MED ORDER — OXYCODONE-ACETAMINOPHEN 5-325 MG PO TABS: 2.0000 | ORAL_TABLET | ORAL | Status: DC | PRN

## 2020-10-18 MED ORDER — PHENYLEPHRINE 40 MCG/ML (10ML) SYRINGE FOR IV PUSH (FOR BLOOD PRESSURE SUPPORT)
80.0000 ug | PREFILLED_SYRINGE | INTRAVENOUS | Status: DC | PRN
  Filled 2020-10-18: qty 10

## 2020-10-18 MED ORDER — ACETAMINOPHEN 325 MG PO TABS
650.0000 mg | ORAL_TABLET | ORAL | Status: DC | PRN
  Administered 2020-10-19 – 2020-10-20 (×3): 650 mg via ORAL
  Filled 2020-10-18 (×3): qty 2

## 2020-10-18 MED ORDER — SODIUM CHLORIDE 0.9 % IV SOLN
5.0000 10*6.[IU] | Freq: Once | INTRAVENOUS | Status: AC
  Administered 2020-10-18: 5 10*6.[IU] via INTRAVENOUS
  Filled 2020-10-18: qty 5

## 2020-10-18 MED ORDER — ONDANSETRON HCL 4 MG/2ML IJ SOLN: 4.0000 mg | Freq: Four times a day (QID) | INTRAMUSCULAR | Status: DC | PRN

## 2020-10-18 MED ORDER — SIMETHICONE 80 MG PO CHEW: 80.0000 mg | CHEWABLE_TABLET | ORAL | Status: DC | PRN

## 2020-10-18 MED ORDER — PRENATAL MULTIVITAMIN CH
1.0000 | ORAL_TABLET | Freq: Every day | ORAL | Status: DC
  Administered 2020-10-19 – 2020-10-20 (×2): 1 via ORAL
  Filled 2020-10-18 (×2): qty 1

## 2020-10-18 NOTE — Anesthesia Preprocedure Evaluation (Signed)
Anesthesia Evaluation  Patient identified by MRN, date of birth, ID band Patient awake    Reviewed: Allergy & Precautions, NPO status , Patient's Chart, lab work & pertinent test results  Airway Mallampati: III  TM Distance: >3 FB Neck ROM: Full    Dental  (+) Teeth Intact, Dental Advisory Given   Pulmonary neg pulmonary ROS, former smoker,    Pulmonary exam normal breath sounds clear to auscultation       Cardiovascular negative cardio ROS Normal cardiovascular exam Rhythm:Regular Rate:Normal     Neuro/Psych negative neurological ROS     GI/Hepatic Neg liver ROS, GERD  Medicated,  Endo/Other  diabetesMorbid obesity  Renal/GU negative Renal ROS     Musculoskeletal negative musculoskeletal ROS (+)   Abdominal   Peds  Hematology  (+) Blood dyscrasia, anemia , Plt 335k   Anesthesia Other Findings Day of surgery medications reviewed with the patient.  Reproductive/Obstetrics                             Anesthesia Physical Anesthesia Plan  ASA: III  Anesthesia Plan: Epidural   Post-op Pain Management:    Induction:   PONV Risk Score and Plan: 2 and Treatment may vary due to age or medical condition  Airway Management Planned: Natural Airway  Additional Equipment:   Intra-op Plan:   Post-operative Plan:   Informed Consent: I have reviewed the patients History and Physical, chart, labs and discussed the procedure including the risks, benefits and alternatives for the proposed anesthesia with the patient or authorized representative who has indicated his/her understanding and acceptance.     Dental advisory given  Plan Discussed with:   Anesthesia Plan Comments: (Patient identified. Risks/Benefits/Options discussed with patient including but not limited to bleeding, infection, nerve damage, paralysis, failed block, incomplete pain control, headache, blood pressure changes,  nausea, vomiting, reactions to medication both or allergic, itching and postpartum back pain. Confirmed with bedside nurse the patient's most recent platelet count. Confirmed with patient that they are not currently taking any anticoagulation, have any bleeding history or any family history of bleeding disorders. Patient expressed understanding and wished to proceed. All questions were answered. )        Anesthesia Quick Evaluation

## 2020-10-18 NOTE — Lactation Note (Signed)
This note was copied from a baby's chart. Lactation Consultation Note  Patient Name: [name] [id number] Date: 10/18/2020   Age:23 hours  LC talked to RN, [employer], states getting ready to do an Xray. Infant already latched in L & D, we can follow up with Mom once she is on the floor.   Maternal Data    Feeding    LATCH Score Latch: Grasps breast easily, tongue down, lips flanged, rhythmical sucking.  Audible Swallowing: A few with stimulation  Type of Nipple: Everted at rest and after stimulation  Comfort (Breast/Nipple): Soft / non-tender  Hold (Positioning): Assistance needed to correctly position infant at breast and maintain latch.  LATCH Score: 8   Lactation Tools Discussed/Used    Interventions    Discharge    Consult Status      Leah Kennedy  Leah Kennedy 10/18/2020, 3:03 PM

## 2020-10-18 NOTE — H&P (Signed)
OBSTETRIC ADMISSION HISTORY AND PHYSICAL  Leah Kennedy is a 25 y.o. female G3P1011 with IUP at [redacted]w[redacted]d presenting for labor. She reports +FMs. No LOF, VB, blurry vision, headaches, peripheral edema, or RUQ pain. She plans on breastfeeding. She requests IUD for birth control.  Dating: By 8wUS --->  Estimated Date of Delivery: 10/28/20  Sono:    @[redacted]w[redacted]d , normal anatomy, 299g, 43%ile  Prenatal History/Complications: - Chlamydia w/neg TOC - MJ use - Rubella non-immune - Obesity  Past Medical History: Past Medical History:  Diagnosis Date  . Depression   . Diabetes mellitus without complication (HCC)    pre-diabetes    Past Surgical History: Past Surgical History:  Procedure Laterality Date  . TONSILLECTOMY  2017    Obstetrical History: OB History    Gravida  3   Para  1   Term  1   Preterm      AB  1   Living  1     SAB  1   IAB      Ectopic      Multiple  0   Live Births  1           Social History: Social History   Socioeconomic History  . Marital status: Significant Other    Spouse name: 2018  . Number of children: 0  . Years of education: 54  . Highest education level: GED or equivalent  Occupational History  . Not on file  Tobacco Use  . Smoking status: Former 14  . Smokeless tobacco: Never Used  Vaping Use  . Vaping Use: Never used  Substance and Sexual Activity  . Alcohol use: No  . Drug use: Not Currently    Types: Marijuana    Comment: before pregnancy  . Sexual activity: Not Currently    Birth control/protection: None  Other Topics Concern  . Not on file  Social History Narrative  . Not on file   Social Determinants of Health   Financial Resource Strain: Low Risk   . Difficulty of Paying Living Expenses: Not hard at all  Food Insecurity: No Food Insecurity  . Worried About Games developer in the Last Year: Never true  . Ran Out of Food in the Last Year: Never true  Transportation Needs: No  Transportation Needs  . Lack of Transportation (Medical): No  . Lack of Transportation (Non-Medical): No  Physical Activity: Sufficiently Active  . Days of Exercise per Week: 3 days  . Minutes of Exercise per Session: 60 min  Stress: No Stress Concern Present  . Feeling of Stress : Only a little  Social Connections: Moderately Isolated  . Frequency of Communication with Friends and Family: More than three times a week  . Frequency of Social Gatherings with Friends and Family: Three times a week  . Attends Religious Services: Never  . Active Member of Clubs or Organizations: No  . Attends Programme researcher, broadcasting/film/video Meetings: Never  . Marital Status: Living with partner    Family History: Family History  Problem Relation Age of Onset  . Stroke Father   . Alcohol abuse Father   . Pulmonary disease Maternal Grandmother   . Drug abuse Maternal Grandmother   . Alcohol abuse Maternal Grandfather   . Heart murmur Paternal Grandmother   . Hypertension Paternal Grandmother   . Diabetes Paternal Grandfather   . Gout Paternal Grandfather   . Kidney disease Paternal Grandfather   . Glaucoma Paternal Grandfather     Allergies:  Allergies  Allergen Reactions  . Carrot [Daucus Carota] Itching    Medications Prior to Admission  Medication Sig Dispense Refill Last Dose  . pantoprazole (PROTONIX) 20 MG tablet Take 1 tablet (20 mg total) by mouth daily. 30 tablet 6   . prenatal vitamin w/FE, FA (PRENATAL 1 + 1) 27-1 MG TABS tablet Take 1 tablet by mouth daily at 12 noon. 30 tablet 12    Review of Systems:  All systems reviewed and negative except as stated in HPI  PE: Blood pressure (!) 85/54, pulse 74, temperature 98.4 F (36.9 C), temperature source Oral, resp. rate (!) 24, last menstrual period 01/08/2020, SpO2 100 %, not currently breastfeeding. General appearance: alert, cooperative and no distress Lungs: regular rate and effort Heart: regular rate  Abdomen: soft,  non-tender Extremities: Homans sign is negative, no sign of DVT Presentation: cephalic EFM: 120 bpm, mod variability, + accels, no decels Toco: 2-5 Dilation: 4.5 Effacement (%): 60 Station: 0 Exam by:: Imagene Sheller, RN  Prenatal labs: ABO, Rh: --/--/PENDING (03/28 4982) Antibody: PENDING (03/28 0823) Rubella: <0.90 (10/26 1132) RPR: Non Reactive (01/14 6415)  HBsAg: Negative (10/26 1132)  HIV: Non Reactive (01/14 8309)  GBS: Positive/-- (03/14 1504)  2 hr GTT normal  Prenatal Transfer Tool  Maternal Diabetes: No Genetic Screening: Normal Maternal Ultrasounds/Referrals: Normal Fetal Ultrasounds or other Referrals:  None Maternal Substance Abuse:  Yes:  Type: Marijuana Significant Maternal Medications:  None Significant Maternal Lab Results: Group B Strep positive  Results for orders placed or performed during the hospital encounter of 10/18/20 (from the past 24 hour(s))  Resp Panel by RT-PCR (Flu A&B, Covid) Nasopharyngeal Swab   Collection Time: 10/18/20  7:18 AM   Specimen: Nasopharyngeal Swab; Nasopharyngeal(NP) swabs in vial transport medium  Result Value Ref Range   SARS Coronavirus 2 by RT PCR NEGATIVE NEGATIVE   Influenza A by PCR NEGATIVE NEGATIVE   Influenza B by PCR NEGATIVE NEGATIVE  CBC   Collection Time: 10/18/20  7:52 AM  Result Value Ref Range   WBC 10.7 (H) 4.0 - 10.5 K/uL   RBC 4.36 3.87 - 5.11 MIL/uL   Hemoglobin 11.8 (L) 12.0 - 15.0 g/dL   HCT 40.7 (L) 68.0 - 88.1 %   MCV 81.4 80.0 - 100.0 fL   MCH 27.1 26.0 - 34.0 pg   MCHC 33.2 30.0 - 36.0 g/dL   RDW 10.3 15.9 - 45.8 %   Platelets 335 150 - 400 K/uL   nRBC 0.0 0.0 - 0.2 %  Type and screen [facility]   Collection Time: 10/18/20  8:23 AM  Result Value Ref Range   ABO/RH(D) PENDING    Antibody Screen PENDING    Sample Expiration      10/21/2020,2359 Performed at [facility], [address] [place], [place] [address]   Results for orders placed or performed during  the hospital encounter of 10/17/20 (from the past 24 hour(s))  Urinalysis, Routine w reflex microscopic Urine, Clean Catch   Collection Time: 10/17/20 12:11 PM  Result Value Ref Range   Color, Urine YELLOW YELLOW   APPearance CLOUDY (A) CLEAR   Specific Gravity, Urine 1.018 1.005 - 1.030   pH 6.0 5.0 - 8.0   Glucose, UA NEGATIVE NEGATIVE mg/dL   Hgb urine dipstick MODERATE (A) NEGATIVE   Bilirubin Urine NEGATIVE NEGATIVE   Ketones, ur NEGATIVE NEGATIVE mg/dL   Protein, ur NEGATIVE NEGATIVE mg/dL   Nitrite NEGATIVE NEGATIVE   Leukocytes,Ua LARGE (A)  NEGATIVE   RBC / HPF 11-20 0 - 5 RBC/hpf   WBC, UA 21-50 0 - 5 WBC/hpf   Bacteria, UA RARE (A) NONE SEEN   Squamous Epithelial / LPF 21-50 0 - 5   Mucus PRESENT     Patient Active Problem List   Diagnosis Date Noted  . Rubella non-immune status, antepartum 05/21/2020  . 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 Encounter for supervision of normal pregnancy, antepartum 04/21/2020  . Morbid obesity (HCC) 08/08/2018  . Chlamydia 07/15/2018    Assessment: Krysten Veronica is a 25 y.o. G3P1011 at [redacted]w[redacted]d here for labor  1. Labor: active 2. FWB: Cat I 3. Pain: initially desired water immersion, now requesting epidural 4. GBS: pos   Plan: Admit to LD Epidural Anticipate SVD BP elevated on intake, will monitor closely  Donette Larry, CNM  10/18/2020, 9:12 AM

## 2020-10-18 NOTE — MAU Provider Note (Signed)
S: Ms. Nohea Kras is a 25 y.o. G3P1011 at [redacted]w[redacted]d  who presents to MAU today complaining contractions q 5 minutes since 0300. She denies vaginal bleeding. She denies LOF. She reports normal fetal movement.    O: BP 128/87 (BP Location: Right Arm)   Pulse 91   Temp 98.4 F (36.9 C) (Oral)   Resp (!) 24   LMP 01/08/2020 (Approximate)   SpO2 100%  GENERAL: Well-developed, well-nourished female in no acute distress.  HEAD: Normocephalic, atraumatic.  CHEST: Normal effort of breathing, regular heart rate ABDOMEN: Soft, nontender, gravid  Cervical exam:  Dilation: 4 Effacement (%): 60,70 Station: 0 Presentation: Vertex Exam by:: Rhunette Croft   Fetal Monitoring: Baseline: 120 Variability: moderate Accelerations: 15x15 Decelerations: none Contractions: 3-4  Patient requested zofran for nausea. Zofran 8mg  ODT ordered.  Rechecked after 1 hour, now 5/70/0   A: SIUP at [redacted]w[redacted]d  Active labor  P: Care turned over to labor team  [redacted]w[redacted]d, CNM 10/18/2020 7:14 AM

## 2020-10-18 NOTE — Lactation Note (Signed)
This note was copied from a baby's chart. Lactation Consultation Note  Patient Name: Leah Kennedy [id number] Date: 10/18/2020 Reason for consult: Initial assessment;Early term 37-38.6wks Age:23 hours   Mom states infant has fed well.  Mom states she had a great milk supply with previous child not 21 months.  She is able to hand express.  She has a personal DEBP Medela.    She is doing STS with infant.  On oral assessment infant was tongue thrusting gloved finger so colostrum was expressed and placed on finger.  Infant began sucking and was moved to the breast.   LC provided pillows for added support. Laid back hold was attempted but mom felt more comfortable with football.   Infant fed for 5 minutes then feel asleep.    Reviewed BF basics with mom.  8-12 feeds in 24 hours/ feed with cues and STS encouraged.    Brochure provided for family.    Maternal Data Has patient been taught Hand Expression?: Yes Does the patient have breastfeeding experience prior to this delivery?: Yes How long did the patient breastfeed?: 6 months with her 10 month old  Feeding Mother's Current Feeding Choice: Breast Milk  LATCH Score Latch: Repeated attempts needed to sustain latch, nipple held in mouth throughout feeding, stimulation needed to elicit sucking reflex.  Audible Swallowing: A few with stimulation  Type of Nipple: Everted at rest and after stimulation  Comfort (Breast/Nipple): Soft / non-tender  Hold (Positioning): Assistance needed to correctly position infant at breast and maintain latch.  LATCH Score: 7   Lactation Tools Discussed/Used Tools:  (hand pump provided for mom; she has a personal DEBP)  Interventions Interventions: Breast feeding basics reviewed;Assisted with latch;Skin to skin;Support pillows;Adjust position;Hand pump  Discharge Pump: Personal (medela) WIC Program: Yes  Consult Status Consult Status: Follow-up Date: 10/19/20 Follow-up type:  In-patient    Maryruth Hancock [facility] 10/18/2020, 10:34 PM

## 2020-10-18 NOTE — MAU Note (Signed)
Pt presents to MAU c/o CTX increasing in intensity since 5 am this morning.  Pt thinks they are a few minutes apart now.  Pt denies LOF or vaginal bleeding, endorses +FM.

## 2020-10-18 NOTE — Anesthesia Procedure Notes (Signed)
Epidural Patient location during procedure: OB Start time: 10/18/2020 9:28 AM End time: 10/18/2020 9:36 AM  Staffing Anesthesiologist: Cecile Hearing, MD Performed: anesthesiologist   Preanesthetic Checklist Completed: patient identified, IV checked, risks and benefits discussed, monitors and equipment checked, pre-op evaluation and timeout performed  Epidural Patient position: sitting Prep: DuraPrep Patient monitoring: blood pressure and continuous pulse ox Approach: midline Location: L3-L4 Injection technique: LOR air  Needle:  Needle type: Tuohy  Needle gauge: 17 G Needle length: 9 cm Needle insertion depth: 9 cm Catheter size: 19 Gauge Catheter at skin depth: 14 cm Test dose: negative and Other (1% Lidocaine)  Additional Notes Patient identified.  Risk benefits discussed including failed block, incomplete pain control, headache, nerve damage, paralysis, blood pressure changes, nausea, vomiting, reactions to medication both toxic or allergic, and postpartum back pain.  Patient expressed understanding and wished to proceed.  All questions were answered.  Sterile technique used throughout procedure and epidural site dressed with sterile barrier dressing. No paresthesia or other complications noted. The patient did not experience any signs of intravascular injection such as tinnitus or metallic taste in mouth nor signs of intrathecal spread such as rapid motor block. Please see nursing notes for vital signs. Reason for block:procedure for pain

## 2020-10-19 MED ORDER — MEASLES, MUMPS & RUBELLA VAC IJ SOLR
0.5000 mL | Freq: Once | INTRAMUSCULAR | Status: AC
  Administered 2020-10-19: 0.5 mL via SUBCUTANEOUS
  Filled 2020-10-19: qty 0.5

## 2020-10-19 MED ORDER — COVID-19 MRNA VAC-TRIS(PFIZER) 30 MCG/0.3ML IM SUSP
0.3000 mL | Freq: Once | INTRAMUSCULAR | Status: AC
  Administered 2020-10-20: 0.3 mL via INTRAMUSCULAR
  Filled 2020-10-19: qty 0.3

## 2020-10-19 MED ORDER — IBUPROFEN 600 MG PO TABS: 600.0000 mg | ORAL_TABLET | Freq: Four times a day (QID) | ORAL | 0 refills | Status: DC

## 2020-10-19 MED ORDER — ACETAMINOPHEN 325 MG PO TABS: 650.0000 mg | ORAL_TABLET | Freq: Four times a day (QID) | ORAL | Status: DC | PRN

## 2020-10-19 MED ORDER — COCONUT OIL OIL: 1.0000 "application " | TOPICAL_OIL | 0 refills | Status: DC | PRN

## 2020-10-19 NOTE — Lactation Note (Signed)
This note was copied from a baby's chart. Lactation Consultation Note  Patient Name: Boy Ovella Manygoats YMEBR'A Date: 10/19/2020 Reason for consult: Follow-up assessment;Infant weight loss;Other (Comment);Early term 37-38.6wks (per mom the baby recently fed and breast feeding is comfortable. hearing swallows. According to the Pedis note there is a chest abnormality and increased respiration.Encouarged mom to call for [facility] if needed.) Age:23 hours  Maternal Data    Feeding Mother's Current Feeding Choice: Breast Milk  LATCH Score ( previous Latch score )  Latch: Grasps breast easily, tongue down, lips flanged, rhythmical sucking.  Audible Swallowing: Spontaneous and intermittent  Type of Nipple: Everted at rest and after stimulation  Comfort (Breast/Nipple): Soft / non-tender  Hold (Positioning): No assistance needed to correctly position infant at breast.  LATCH Score: 10   Lactation Tools Discussed/Used    Interventions    Discharge    Consult Status Consult Status: Follow-up Date: 10/20/20 Follow-up type: In-patient    Matilde Sprang Meghan Warshawsky 10/19/2020, 2:24 PM

## 2020-10-19 NOTE — Anesthesia Postprocedure Evaluation (Signed)
Anesthesia Post Note  Patient: Leah Kennedy  Procedure(s) Performed: AN AD HOC LABOR EPIDURAL     Patient location during evaluation: Mother Baby Anesthesia Type: Epidural Level of consciousness: awake and alert Pain management: pain level controlled Vital Signs Assessment: post-procedure vital signs reviewed and stable Respiratory status: spontaneous breathing Cardiovascular status: stable Postop Assessment: no headache, adequate PO intake, no backache, patient able to bend at knees, able to ambulate, epidural receding and no apparent nausea or vomiting Anesthetic complications: no   No complications documented.  Last Vitals:  Vitals:   10/19/20 0600 10/19/20 1448  BP: (!) 116/58 (!) 130/58  Pulse:  74  Resp: 18 16  Temp: 36.7 C 36.5 C  SpO2:      Last Pain:  Vitals:   10/19/20 1448  TempSrc: Oral  PainSc:    Pain Goal:                   Salome Arnt

## 2020-10-19 NOTE — Discharge Summary (Signed)
  Postpartum Discharge Summary     Patient Name: Leah Kennedy DOB: 10/31/1995 MRN: [id number]  Date of admission: 10/18/2020 Delivery date:10/18/2020  Delivering provider: BHAMBRI, MELANIE  Date of discharge: 10/19/2020  Admitting diagnosis: Supervision of low-risk pregnancy, third trimester [Z34.93] Intrauterine pregnancy: [redacted]w[redacted]d     Secondary diagnosis:  Active Problems:   Chlamydia   Marijuana use   Rubella non-immune status, antepartum   Vaginal delivery  Additional problems: as noted above  Discharge diagnosis: Term Pregnancy Delivered                                              Post partum procedures:none Augmentation: none Complications: None  Hospital course: Onset of Labor With Vaginal Delivery      25 y.o. yo G3P2012 at [redacted]w[redacted]d was admitted in Latent Labor on 10/18/2020. Patient had an uncomplicated labor course as follows:  Membrane Rupture Time/Date: 1:49 PM ,10/18/2020   Delivery Method:Vaginal, Spontaneous  Episiotomy: None  Lacerations:  Labial  Patient had an uncomplicated postpartum course.  She is ambulating, tolerating a regular diet, passing flatus, and urinating well. Patient is discharged home in stable condition on 10/19/20.  Newborn Data: Birth date:10/18/2020  Birth time:2:00 PM  Gender:Female  Living status:Living  Apgars:8 ,9  Weight:2980 g   Magnesium Sulfate received: No BMZ received: No Rhophylac:N/A MMR:offered prior to delivery T-DaP:Given prenatally Flu: Yes Transfusion:No  Physical exam  Vitals:   10/18/20 1612 10/18/20 1700 10/18/20 2308 10/19/20 0600  BP: 121/62 140/71 (!) 119/56 (!) 116/58  Pulse: (!) 54 (!) 58 (!) 56   Resp: 16 18 18 18  Temp: 97.7 F (36.5 C) 97.8 F (36.6 C) 98.6 F (37 C) 98.1 F (36.7 C)  TempSrc: Oral Oral Oral Oral  SpO2: 100% 100%     General: alert, cooperative and no distress Lochia: appropriate Uterine Fundus: firm Incision: N/A DVT Evaluation: No evidence of DVT seen on physical exam. No  cords or calf tenderness. No significant calf/ankle edema. Labs: Lab Results  Component Value Date   WBC 10.7 (H) 10/18/2020   HGB 11.8 (L) 10/18/2020   HCT 35.5 (L) 10/18/2020   MCV 81.4 10/18/2020   PLT 335 10/18/2020   CMP Latest Ref Rng & Units 01/10/2019  Glucose 65 - 99 mg/dL 99  BUN 6 - 20 mg/dL 6  Creatinine 0.57 - 1.00 mg/dL 0.67  Sodium 134 - 144 mmol/L 141  Potassium 3.5 - 5.2 mmol/L 4.0  Chloride 96 - 106 mmol/L 107(H)  CO2 20 - 29 mmol/L 19(L)  Calcium 8.7 - 10.2 mg/dL 8.7  Total Protein 6.0 - 8.5 g/dL 6.1  Total Bilirubin 0.0 - 1.2 mg/dL <0.2  Alkaline Phos 39 - 117 IU/L 116  AST 0 - 40 IU/L 17  ALT 0 - 32 IU/L 9   Edinburgh Score: Edinburgh Postnatal Depression Scale Screening Tool 10/19/2020  I have been able to laugh and see the funny side of things. 0  I have looked forward with enjoyment to things. 0  I have blamed myself unnecessarily when things went wrong. 0  I have been anxious or worried for no good reason. 2  I have felt scared or panicky for no good reason. 2  Things have been getting on top of me. 1  I have been so unhappy that I have had difficulty sleeping. 0  I have felt sad or   miserable. 0  I have been so unhappy that I have been crying. 0  The thought of harming myself has occurred to me. 0  Edinburgh Postnatal Depression Scale Total 5     After visit meds:  Allergies as of 10/19/2020      Reactions   Carrot [daucus Carota] Itching      Medication List    STOP taking these medications   pantoprazole 20 MG tablet Commonly known as: Protonix     TAKE these medications   acetaminophen 325 MG tablet Commonly known as: Tylenol Take 2 tablets (650 mg total) by mouth every 6 (six) hours as needed for mild pain, moderate pain, fever or headache (for pain scale < 4).   coconut oil Oil Apply 1 application topically as needed (nipple pain).   ibuprofen 600 MG tablet Commonly known as: ADVIL Take 1 tablet (600 mg total) by mouth every  6 (six) hours.   prenatal vitamin w/FE, FA 27-1 MG Tabs tablet Take 1 tablet by mouth daily at 12 noon.        Discharge home in stable condition Infant Feeding: Breast Infant Disposition:home with mother Discharge instruction: per After Visit Summary and Postpartum booklet. Activity: Advance as tolerated. Pelvic rest for 6 weeks.  Diet: routine diet Future Appointments: Future Appointments  Date Time Provider [place]  11/23/2020 11:50 AM Roma Schanz, CNM [facility]   Follow up Visit: Message sent to [facility] by Dr. Astrid Drafts.  Please schedule this patient for a In person postpartum visit in 6 weeks with the following provider: Any provider. Additional Postpartum F/U:Postpartum Depression checkup  Low risk pregnancy complicated by: Depression, Rubella non immune Delivery mode:  Vaginal, Spontaneous  Anticipated Birth Control:  plan for outpatient IUD  Cabella Kimm, Gildardo Cranker, MD OB Fellow, [facility] 10/19/2020 9:10 AM

## 2020-10-19 NOTE — Discharge Instructions (Signed)

## 2020-10-19 NOTE — Clinical Social Work Maternal (Signed)
CLINICAL SOCIAL WORK MATERNAL/CHILD NOTE  Patient Details  Name: Leah Kennedy MRN: [id number] Date of Birth: 07/21/1996  Date:  10/19/2020  Clinical Social Worker Initiating Note:  Suad Autrey, LCSW Date/Time: Initiated:  10/19/20/1124     Child's Name:  [name]   Biological Parents:  Mother,Father   Need for Interpreter:  None   Reason for Referral:  Current Substance Use/Substance Use During Pregnancy ,Behavioral Health Concerns   Address:  [address] [place] [address]    Phone number:  [phone] (home)     Additional phone number:  Household Members/Support Persons (HM/SP):   Household Member/Support Person 1,Household Member/Support Person 2   HM/SP Name Relationship DOB or Age  HM/SP -1 [name] Significant Other 10-06-1995  HM/SP -2 [name] Child 01-17-2019  HM/SP -3        HM/SP -4        HM/SP -5        HM/SP -6        HM/SP -7        HM/SP -8          Natural Supports (not living in the home):  Extended Family   Professional Supports:     Employment: Full-time   Type of Work: [employer]   Education:  Other (comment) (GED)   Homebound arranged:    Financial Resources:  Medicaid   Other Resources:  Food Stamps ,Child Support,WIC   Cultural/Religious Considerations Which May Impact Care:  None  Strengths:  Ability to meet basic needs ,Home prepared for child    Psychotropic Medications:         Pediatrician:       Pediatrician List:       [place]    [place]    [place]    [place]    [place]      Pediatrician Fax Number:    Risk Factors/Current Problems:  Substance Use ,Mental Health Concerns    Cognitive State:  Alert ,Insightful ,Able to Concentrate ,Linear Thinking    Mood/Affect:  Calm ,Relaxed    CSW Assessment:  CSW received consult for hx of Anxiety and substance exposed newborn.  CSW met with MOB to offer support and  complete assessment.    CSW met with the MOB at bedside. CSW introduced role and congratulated MOB. CSW observed MOB relaxing and infant sleeping in bassinet. FOB sleep at bedside. CSW asked MOB if this was a good time to talk and informed her of her right to privacy/HIPPA. MOB agreeable to proceed. CSW asked MOB how she is feeling emotionally. MOB reports, " I feel fine." CSW asked MOB about her pregnancy. MOB reports her pregnancy was fine, no complications. CSW asked MOB about her mental health history. MOB reports she was diagnosed with ADHD at the age of 12. PTSD, ADD, and anxiety as a preteen. MOB reports she was prescribed Lexapro at the time. MOB reports she weaned herself off Lexapro and has not taken the medication since prior to her last pregnancy. MOB reports the birth of her last child made it easier to maintain her mental her health.  MOB reports she has been managing her anxiety, ADHD, PTSD well. MOB reports she focuses on being a mom, keeps herself busy and talks through her issues with her grandmother and FOB. MOB reports they are her supports. CSW assessed for safety, MOB denies suicidal and homicidal thoughts.   CSW asked MOB if she had any postpartum   depression symptoms during her last pregnancy. MOB reports no symptoms of postpartum depression. CSW recommends self-evaluation during the postpartum time period using the New Mom Checklist from Postpartum Progress and encouraged MOB to contact a medical professional if symptoms are noted at any time.  MOB receptive and agreeable to use the check list. CSW provided education regarding the baby blues period vs. perinatal mood disorders. discussed treatment and gave resources for mental health follow up if concerns arise. MOB reports she is not interested in therapy at this time but acceptable to the resources.   CSW asked MOB if she used any substances during her pregnancy.  MOB reports she used marijuana twice, first, prior to learning she was  pregnant and then one time during her first trimester. MOB reports she used it only for nausea. MOB reports she stopped after the medical staff at the OBGYN office told her not to self-medicate and prescribed her nausea medications. CSW explain the [place] drug screen policy and informed MOB the infant's urine will be screened, the umbilical cord will be screened and followed until the results return. If the infant test positive for any substance, a report will be made to CPS. MOB reports, "yes, that's fine, I understand. " CSW asked if MOB has a CPS history or an open case. MOB denies any CPS involvement.   CSW provided review of Sudden Infant Death Syndrome (SIDS) precautions and informed MOB of no co-sleeping with the infant. CSW asked MOB if she has items for the infant. MOB reports the infant has items including a car seat, crib and WIC/FS. MOB reports she will reach out to WIC tomorrow to add the infant. MOB reports she is hoping to breastfeed the infant for longer than six months, which was the length of time she breast fed her last child. CSW assessed MOB for additional needs. MOB reports no additional needs.     CSW identifies no further need for intervention and no barriers to discharge at this time.  CSW Plan/Description:  No Further Intervention Required/No Barriers to Discharge,Sudden Infant Death Syndrome (SIDS) Education,CSW Will Continue to Monitor Umbilical Cord Tissue Drug Screen Results and Make Report if Warranted,Perinatal Mood and Anxiety Disorder (PMADs) Education,Hospital Drug Screen Policy Information    Keisuke Hollabaugh A Dyshaun Bonzo, LCSW 10/19/2020, 11:31 AM 

## 2020-10-20 NOTE — Discharge Summary (Addendum)
Postpartum Discharge Summary    Patient Name: Leah Kennedy DOB: 1996-02-15 MRN: [id number]  Date of admission: 10/18/2020 Delivery date:10/18/2020  Delivering provider: Julianne Handler  Date of discharge: 10/20/2020  Admitting diagnosis: Supervision of low-risk pregnancy, third trimester [Z34.93] Intrauterine pregnancy: [redacted]w[redacted]d    Secondary diagnosis:  Active Problems:   Chlamydia   Marijuana use   Rubella non-immune status, antepartum   Vaginal delivery  Additional problems: as noted above  Discharge diagnosis: Term Pregnancy Delivered                                              Post partum procedures:none Augmentation: none Complications: None  Hospital course: Onset of Labor With Vaginal Delivery      25y.o. [id number] admitted in Latent Labor on 10/18/2020. Patient had an uncomplicated labor course as follows:  Membrane Rupture Time/Date: 1:49 PM ,10/18/2020   Delivery Method:Vaginal, Spontaneous  Episiotomy: None  Lacerations:  Labial  Patient had an uncomplicated postpartum course.  She is ambulating, tolerating a regular diet, passing flatus, and urinating well. Patient is discharged home in stable condition on 10/20/20.  Newborn Data: Birth date:10/18/2020  Birth time:2:00 PM  Gender:Female  Living status:Living  Apgars:8 ,9  Weight:2980 g   Magnesium Sulfate received: No BMZ received: No Rhophylac:N/A MMR: given prior to delivery T-DaP:Given prenatally Flu: Yes Transfusion:No  Physical exam  Vitals:   10/19/20 0600 10/19/20 1448 10/19/20 2323 10/20/20 0550  BP: (!) 116/58 (!) 130/58 131/62 124/66  Pulse:  74 79 81  Resp: '18 16 16 18  ' Temp: 98.1 F (36.7 C) 97.7 F (36.5 C) 97.9 F (36.6 C) 98 F (36.7 C)  TempSrc: Oral Oral Oral Oral  SpO2:   100% 100%   General: alert, cooperative and no distress Lochia: appropriate Uterine Fundus: firm Incision: N/A DVT Evaluation: No evidence of DVT seen on physical exam. No cords or calf  tenderness. No significant calf/ankle edema. Labs: Lab Results  Component Value Date   WBC 10.7 (H) 10/18/2020   HGB 11.8 (L) 10/18/2020   HCT 35.5 (L) 10/18/2020   MCV 81.4 10/18/2020   PLT 335 10/18/2020   CMP Latest Ref Rng & Units 01/10/2019  Glucose 65 - 99 mg/dL 99  BUN 6 - 20 mg/dL 6  Creatinine 0.57 - 1.00 mg/dL 0.67  Sodium 134 - 144 mmol/L 141  Potassium 3.5 - 5.2 mmol/L 4.0  Chloride 96 - 106 mmol/L 107(H)  CO2 20 - 29 mmol/L 19(L)  Calcium 8.7 - 10.2 mg/dL 8.7  Total Protein 6.0 - 8.5 g/dL 6.1  Total Bilirubin 0.0 - 1.2 mg/dL <0.2  Alkaline Phos 39 - 117 IU/L 116  AST 0 - 40 IU/L 17  ALT 0 - 32 IU/L 9   Edinburgh Score: Edinburgh Postnatal Depression Scale Screening Tool 10/19/2020  I have been able to laugh and see the funny side of things. 0  I have looked forward with enjoyment to things. 0  I have blamed myself unnecessarily when things went wrong. 0  I have been anxious or worried for no good reason. 2  I have felt scared or panicky for no good reason. 2  Things have been getting on top of me. 1  I have been so unhappy that I have had difficulty sleeping. 0  I have felt sad or miserable. 0  I  have been so unhappy that I have been crying. 0  The thought of harming myself has occurred to me. 0  Edinburgh Postnatal Depression Scale Total 5     After visit meds:  Allergies as of 10/20/2020      Reactions   Carrot [daucus Carota] Itching      Medication List    STOP taking these medications   pantoprazole 20 MG tablet Commonly known as: Protonix     TAKE these medications   acetaminophen 325 MG tablet Commonly known as: Tylenol Take 2 tablets (650 mg total) by mouth every 6 (six) hours as needed for mild pain, moderate pain, fever or headache (for pain scale < 4).   coconut oil Oil Apply 1 application topically as needed (nipple pain).   ibuprofen 600 MG tablet Commonly known as: ADVIL Take 1 tablet (600 mg total) by mouth every 6 (six)  hours.   prenatal vitamin w/FE, FA 27-1 MG Tabs tablet Take 1 tablet by mouth daily at 12 noon.        Discharge home in stable condition Infant Feeding: Breast Infant Disposition:home with mother Discharge instruction: per After Visit Summary and Postpartum booklet. Activity: Advance as tolerated. Pelvic rest for 6 weeks.  Diet: routine diet Future Appointments: Future Appointments  Date Time Provider [place]  11/23/2020 11:50 AM Roma Schanz, CNM [facility]   Follow up Visit: Message sent to [facility] by Dr. Astrid Drafts.  Please schedule this patient for a In person postpartum visit in 6 weeks with the following provider: Any provider. Additional Postpartum F/U:Postpartum Depression checkup  Low risk pregnancy complicated by: Depression, Rubella non immune Delivery mode:  Vaginal, Spontaneous  Anticipated Birth Control:  plan for outpatient IUD   Alcus Dad, MD PGY-1 [facility] 10/20/2020 7:41 AM   GME ATTESTATION:  I saw and evaluated the patient. I agree with the findings and the plan of care as documented in the resident's note.  Arrie Senate, MD OB Fellow, [facility] 10/20/2020 8:24 AM

## 2020-10-20 NOTE — Lactation Note (Signed)
This note was copied from a baby's chart. Lactation Consultation Note  Patient Name: Leah Kennedy EUMPN'T Date: 10/20/2020 Reason for consult: Follow-up assessment;Early term 37-38.6wks;Infant weight loss Age:23 hours , 5 % weight loss,  Baby latched when LC entered the room on the left breast with depth and per mom comfortable. Baby fed for 15 mins and released and was content.  Mom encouraged to offer breast 8-12 times a day. Also since milk is coming in , to prevent encouragement.  See below for education.   Maternal Data Has patient been taught Hand Expression?: Yes  Feeding Mother's Current Feeding Choice: Breast Milk and Formula  LATCH Score Latch:  (latched with depth)  Audible Swallowing:  (increased swallows with breast compressions)  Type of Nipple:  (nipple well rounded when baby released)  Comfort (Breast/Nipple):  (per mom comfortable)  Hold (Positioning):  (per mom latched baby)      Lactation Tools Discussed/Used    Interventions Interventions: Breast feeding basics reviewed;Hand pump;Education  Discharge Discharge Education: Engorgement and breast care;Warning signs for feeding baby Pump: Personal;Manual;DEBP  Consult Status Consult Status: Complete Date: 10/20/20    Kathrin Greathouse 10/20/2020, 2:09 PM

## 2020-10-21 ENCOUNTER — Encounter: Payer: Medicaid Other | Admitting: Obstetrics & Gynecology

## 2020-10-21 ENCOUNTER — Telehealth: Payer: Self-pay | Admitting: *Deleted

## 2020-10-21 NOTE — Telephone Encounter (Signed)
Transition Care Management Unsuccessful Follow-up Telephone Call  Date of discharge and from where:  10/20/2020 - [facility]  Attempts:  1st Attempt  Reason for unsuccessful TCM follow-up call:  Left voice message

## 2020-10-22 ENCOUNTER — Telehealth: Payer: Self-pay | Admitting: Women's Health

## 2020-10-22 ENCOUNTER — Encounter (HOSPITAL_COMMUNITY): Payer: Self-pay | Admitting: *Deleted

## 2020-10-22 ENCOUNTER — Emergency Department (HOSPITAL_COMMUNITY)
Admission: EM | Admit: 2020-10-22 | Discharge: 2020-10-22 | Disposition: A | Discharge status: Home / Self Care | Payer: Medicaid Other | Attending: Emergency Medicine | Admitting: Emergency Medicine

## 2020-10-22 ENCOUNTER — Other Ambulatory Visit: Payer: Self-pay

## 2020-10-22 DIAGNOSIS — R609 Edema, unspecified: Secondary | ICD-10-CM

## 2020-10-22 DIAGNOSIS — R600 Localized edema: Secondary | ICD-10-CM | POA: Insufficient documentation

## 2020-10-22 DIAGNOSIS — R102 Pelvic and perineal pain: Secondary | ICD-10-CM | POA: Diagnosis not present

## 2020-10-22 DIAGNOSIS — R519 Headache, unspecified: Secondary | ICD-10-CM | POA: Diagnosis not present

## 2020-10-22 DIAGNOSIS — Z87891 Personal history of nicotine dependence: Secondary | ICD-10-CM | POA: Diagnosis not present

## 2020-10-22 DIAGNOSIS — O9089 Other complications of the puerperium, not elsewhere classified: Secondary | ICD-10-CM | POA: Diagnosis present

## 2020-10-22 DIAGNOSIS — O26899 Other specified pregnancy related conditions, unspecified trimester: Secondary | ICD-10-CM

## 2020-10-22 DIAGNOSIS — R9431 Abnormal electrocardiogram [ECG] [EKG]: Secondary | ICD-10-CM | POA: Diagnosis not present

## 2020-10-22 DIAGNOSIS — O2493 Unspecified diabetes mellitus in the puerperium: Secondary | ICD-10-CM | POA: Insufficient documentation

## 2020-10-22 LAB — URINALYSIS, ROUTINE W REFLEX MICROSCOPIC
Bilirubin Urine: NEGATIVE
Glucose, UA: NEGATIVE mg/dL
Ketones, ur: NEGATIVE mg/dL
Leukocytes,Ua: NEGATIVE
Nitrite: NEGATIVE
Protein, ur: 30 mg/dL — AB
Specific Gravity, Urine: 1.016 (ref 1.005–1.030)
pH: 6 (ref 5.0–8.0)

## 2020-10-22 LAB — CBC WITH DIFFERENTIAL/PLATELET
Abs Immature Granulocytes: 0.02 10*3/uL (ref 0.00–0.07)
Basophils Absolute: 0 10*3/uL (ref 0.0–0.1)
Basophils Relative: 0 %
Eosinophils Absolute: 0.3 10*3/uL (ref 0.0–0.5)
Eosinophils Relative: 3 %
HCT: 33.1 % — ABNORMAL LOW (ref 36.0–46.0)
Hemoglobin: 10.4 g/dL — ABNORMAL LOW (ref 12.0–15.0)
Immature Granulocytes: 0 %
Lymphocytes Relative: 31 %
Lymphs Abs: 2.3 10*3/uL (ref 0.7–4.0)
MCH: 27 pg (ref 26.0–34.0)
MCHC: 31.4 g/dL (ref 30.0–36.0)
MCV: 86 fL (ref 80.0–100.0)
Monocytes Absolute: 0.6 10*3/uL (ref 0.1–1.0)
Monocytes Relative: 9 %
Neutro Abs: 4.2 10*3/uL (ref 1.7–7.7)
Neutrophils Relative %: 57 %
Platelets: 293 10*3/uL (ref 150–400)
RBC: 3.85 MIL/uL — ABNORMAL LOW (ref 3.87–5.11)
RDW: 15.1 % (ref 11.5–15.5)
WBC: 7.5 10*3/uL (ref 4.0–10.5)
nRBC: 0 % (ref 0.0–0.2)

## 2020-10-22 LAB — COMPREHENSIVE METABOLIC PANEL
ALT: 19 U/L (ref 0–44)
AST: 28 U/L (ref 15–41)
Albumin: 2.5 g/dL — ABNORMAL LOW (ref 3.5–5.0)
Alkaline Phosphatase: 83 U/L (ref 38–126)
Anion gap: 7 (ref 5–15)
BUN: 6 mg/dL (ref 6–20)
CO2: 25 mmol/L (ref 22–32)
Calcium: 7.8 mg/dL — ABNORMAL LOW (ref 8.9–10.3)
Chloride: 107 mmol/L (ref 98–111)
Creatinine, Ser: 0.6 mg/dL (ref 0.44–1.00)
GFR, Estimated: >60 mL/min (ref >60)
Glucose, Bld: 84 mg/dL (ref 70–99)
Potassium: 3.5 mmol/L (ref 3.5–5.1)
Sodium: 139 mmol/L (ref 135–145)
Total Bilirubin: 0.5 mg/dL (ref 0.3–1.2)
Total Protein: 5.9 g/dL — ABNORMAL LOW (ref 6.5–8.1)

## 2020-10-22 NOTE — ED Triage Notes (Signed)
States she delivered on 10/18/20. C/o swelling in legs and facial area

## 2020-10-22 NOTE — Telephone Encounter (Signed)
Transition Care Management Follow-up Telephone Call  Date of discharge and from where: 10/20/2020 from [facility].  How have you been since you were released from the hospital? Pt stated that she is having swelling and blurred vision. Pt stated that the OB called in recommended that she go to the ED.

## 2020-10-22 NOTE — ED Provider Notes (Signed)
[facility] EMERGENCY DEPARTMENT Provider Note   CSN: [id number] Arrival date & time: 10/22/20  1227     History Chief Complaint  Patient presents with  . Edema    Leah Kennedy is a 25 y.o. female presenting for evaluation of swelling, HA, low back pain and lower abd pain.   Pt states she gave birth 4 days ago (3/28) at [facility]. She was d/c yesterday. Over the past 24 hours, she has had worsening swelling of her legs, now progressed to her face and arms. She reports a HA today, and elevated BPs at home. Her OB recommended evaluation. She took tylenol without improvement of sxs. She denies fevers, cp, cough, sob, n/v, upper abd pain. She has lower abd pain and minimal vaginal bleeding. She has not had a BM since d/c from the hospital. She state she has no medical problems, takes no medications daily.   HPI     Past Medical History:  Diagnosis Date  . Depression   . Diabetes mellitus without complication (HCC)    pre-diabetes    Patient Active Problem List   Diagnosis Date Noted  . Vaginal delivery 10/18/2020  . Rubella non-immune status, antepartum 05/21/2020  . 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 Encounter for supervision of normal pregnancy, antepartum 04/21/2020  . Morbid obesity (HCC) 08/08/2018  . Chlamydia 07/15/2018    Past Surgical History:  Procedure Laterality Date  . TONSILLECTOMY  2017     OB History    Gravida  3   Para  2   Term  2   Preterm      AB  1   Living  2     SAB  1   IAB      Ectopic      Multiple  0   Live Births  2           Family History  Problem Relation Age of Onset  . Stroke Father   . Alcohol abuse Father   . Pulmonary disease Maternal Grandmother   . Drug abuse Maternal Grandmother   . Alcohol abuse Maternal Grandfather   . Heart murmur Paternal Grandmother   . Hypertension Paternal Grandmother   . Diabetes Paternal Grandfather   . Gout Paternal Grandfather   . Kidney disease Paternal Grandfather   . Glaucoma  Paternal Grandfather     Social History   Tobacco Use  . Smoking status: Former Games developer  . Smokeless tobacco: Never Used  Vaping Use  . Vaping Use: Never used  Substance Use Topics  . Alcohol use: No  . Drug use: Not Currently    Types: Marijuana    Comment: before pregnancy    Home Medications Prior to Admission medications   Medication Sig Start Date End Date Taking? Authorizing Provider  acetaminophen (TYLENOL) 325 MG tablet Take 2 tablets (650 mg total) by mouth every 6 (six) hours as needed for mild pain, moderate pain, fever or headache (for pain scale < 4). 10/19/20  Yes Goswick, Skipper Cliche, MD  ibuprofen (ADVIL) 600 MG tablet Take 1 tablet (600 mg total) by mouth every 6 (six) hours. 10/19/20  Yes Sheila Oats, MD  prenatal vitamin w/FE, FA (PRENATAL 1 + 1) 27-1 MG TABS tablet Take 1 tablet by mouth daily at 12 noon. 04/01/20  Yes Cyril Mourning A, NP  coconut oil OIL Apply 1 application topically as needed (nipple pain). Patient not taking: No sig reported 10/19/20   Sheila Oats, MD  Allergies    Carrot [daucus carota]  Review of Systems   Review of Systems  Cardiovascular: Positive for leg swelling.  Genitourinary: Positive for pelvic pain and vaginal bleeding.  Neurological: Positive for headaches.  All other systems reviewed and are negative.   Physical Exam Updated Vital Signs BP 119/83   Pulse 83   Temp 98.2 F (36.8 C) (Oral)   Resp 17   SpO2 100%   Physical Exam Vitals and nursing note reviewed.  Constitutional:      General: She is not in acute distress.    Appearance: She is well-developed.     Comments: Resting in the bed in NAD  HENT:     Head: Normocephalic and atraumatic.  Eyes:     Extraocular Movements: Extraocular movements intact.     Conjunctiva/sclera: Conjunctivae normal.     Pupils: Pupils are equal, round, and reactive to light.  Cardiovascular:     Rate and Rhythm: Normal rate and regular rhythm.     Pulses: Normal pulses.   Pulmonary:     Effort: Pulmonary effort is normal. No respiratory distress.     Breath sounds: Normal breath sounds. No wheezing.  Abdominal:     General: There is no distension.     Palpations: Abdomen is soft. There is no mass.     Tenderness: There is no abdominal tenderness. There is no guarding or rebound.     Comments: No ttp on my evaluation. No rigidity, guarding, distention. Negative rebound.   Musculoskeletal:        General: Normal range of motion.     Cervical back: Normal range of motion and neck supple.     Comments: No edema noted on my exam of upper or lower ext. Radial and pedal pulses 2+ bilaterally  Skin:    General: Skin is warm and dry.     Capillary Refill: Capillary refill takes less than 2 seconds.  Neurological:     Mental Status: She is alert and oriented to person, place, and time.     ED Results / Procedures / Treatments   Labs (all labs ordered are listed, but only abnormal results are displayed) Labs Reviewed  CBC WITH DIFFERENTIAL/PLATELET - Abnormal; Notable for the following components:      Result Value   RBC 3.85 (*)    Hemoglobin 10.4 (*)    HCT 33.1 (*)    All other components within normal limits  COMPREHENSIVE METABOLIC PANEL - Abnormal; Notable for the following components:   Calcium 7.8 (*)    Total Protein 5.9 (*)    Albumin 2.5 (*)    All other components within normal limits  URINALYSIS, ROUTINE W REFLEX MICROSCOPIC - Abnormal; Notable for the following components:   Hgb urine dipstick LARGE (*)    Protein, ur 30 (*)    Bacteria, UA RARE (*)    All other components within normal limits    EKG EKG Interpretation  Date/Time:  Friday October 22 2020 12:55:48 EDT Ventricular Rate:  73 PR Interval:  158 QRS Duration: 77 QT Interval:  390 QTC Calculation: 430 R Axis:   76 Text Interpretation: Sinus rhythm No old tracing to compare Confirmed by Jacalyn Lefevre [phone]) on 10/22/2020 12:58:43 PM   Radiology No results  found.  Procedures Procedures   Medications Ordered in ED Medications - No data to display  ED Course  I have reviewed the triage vital signs and the nursing notes.  Pertinent labs & imaging results that  were available during my care of the patient were reviewed by me and considered in my medical decision making (see chart for details).    MDM Rules/Calculators/A&P                          Patient presented for concerns for peripheral edema, headache, elevated blood pressure.  On my evaluation, patient is nontoxic.  She does not have any notable peripheral edema that I can visualize.  Blood pressure is not elevated in the department.  However considering her symptoms and recent vaginal birth, will obtain labs including LFTs and urine.  She is already taking Tylenol for her headache, will continue to monitor.  Labs interpreted by me, overall reassuring.  LFTs normal.  Urine shows trace protein, she has had this in the past.  On reassessment, headache has resolved and blood pressure remained stable.  Doubt preeclampsia or hellp at this time.  Discussed with patient that her low pelvic pain is likely due to recent vaginal delivery.  She is having some mild low back pain as well, which could be from the same.  Also consider constipation as patient states she has not had a bowel movement recently.  Encouraged hydration close follow-up with OB/GYN.  At this time, patient appears safe for discharge.  Return precautions given.  Patient states she understands and agrees to plan.  Final Clinical Impression(s) / ED Diagnoses Final diagnoses:  Peripheral edema  Nonintractable headache, unspecified chronicity pattern, unspecified headache type  Peripartum pelvic pain    Rx / DC Orders ED Discharge Orders    None       Alveria Apley, PA-C 10/22/20 1457    Jacalyn Lefevre, MD 10/22/20 1505

## 2020-10-22 NOTE — Discharge Instructions (Signed)
Continue monitoring symptoms. Take Tylenol as needed for headache or pain. You may try heating pad to help with pelvic pain. Monitor vaginal bleeding, if it worsens significantly follow-up with OB/GYN. Schedule appoint with your OB/GYN for further evaluation of your symptoms and for recheck of your lower abdominal and back pain. Return to the emergency room if you develop any new, worsening, concerning symptoms

## 2020-10-22 NOTE — Telephone Encounter (Signed)
Pt had her son on 3/28, feeling rough, feeling swollen all over-face, arms, feet Has a headache - feeling this way since discharge yesterday from [facility]  Please advise & call pt

## 2020-10-22 NOTE — Telephone Encounter (Signed)
Patient states she has a headache, swelling in her face, hands and does not feel well.  BP 139/94.  Advised to take Tylenol and would speak with her again in an hour.    10:40:  Spoke to patient.  States she took Tylenol but head is still hurting.  She is seeing spots now.  BP 139/93.  Discussed with K[facility] and advised patient to go to [facility] for eval. Pt verbalized understanding and agreeable to do so.

## 2020-10-25 ENCOUNTER — Telehealth: Payer: Self-pay

## 2020-10-25 NOTE — Telephone Encounter (Signed)
Transition Care Management Unsuccessful Follow-up Telephone Call  Date of discharge and from where:  10/22/2020 from [facility]  Attempts:  1st Attempt  Reason for unsuccessful TCM follow-up call:  Left voice message

## 2020-10-26 NOTE — Telephone Encounter (Signed)
Transition Care Management Unsuccessful Follow-up Telephone Call  Date of discharge and from where:  10/22/2020 from [place]  Attempts:  2nd Attempt  Reason for unsuccessful TCM follow-up call:  Left voice message     

## 2020-10-28 NOTE — Telephone Encounter (Signed)
Transition Care Management Follow-up Telephone Call  Date of discharge and from where: 10/22/2020 from [facility]  How have you been since you were released from the hospital? Pt states that she is feeling well and has no questions or concerns at this time.   Any questions or concerns? No  Items Reviewed:  Did the pt receive and understand the discharge instructions provided? Yes   Medications obtained and verified? Yes   Other? No   Any new allergies since your discharge? No   Dietary orders reviewed? n/a  Do you have support at home? Yes   Functional Questionnaire: (I = Independent and D = Dependent) ADLs: I  Bathing/Dressing- I  Meal Prep- I  Eating- I  Maintaining continence- I  Transferring/Ambulation- I  Managing Meds- I   Follow up appointments reviewed:   PCP Hospital f/u appt confirmed? No    Specialist Hospital f/u appt confirmed? Yes  Scheduled to see Shawna Clamp, CNM on 11/23/2020.  Are transportation arrangements needed? No    If their condition worsens, is the pt aware to call PCP or go to the Emergency Dept.? Yes  Was the patient provided with contact information for the PCP's office or ED? Yes  Was to pt encouraged to call back with questions or concerns? Yes

## 2020-11-11 ENCOUNTER — Ambulatory Visit: Payer: Medicaid Other | Admitting: Advanced Practice Midwife

## 2020-11-23 ENCOUNTER — Ambulatory Visit: Payer: Medicaid Other | Admitting: Women's Health

## 2020-11-25 ENCOUNTER — Ambulatory Visit: Payer: Medicaid Other | Admitting: Nurse Practitioner

## 2020-12-30 ENCOUNTER — Telehealth: Payer: Self-pay | Admitting: Women's Health

## 2020-12-30 NOTE — Telephone Encounter (Signed)
Pt took 2 home preg tests 12/27/20, both were faint positive Took 2 home preg tests 12/29/20 that were positive & darker This morning home preg tests were negative Not sure if brand matters or may be testing too early Wonders if she should come in for UPT or HCG  Please advise & notify pt

## 2021-01-03 ENCOUNTER — Other Ambulatory Visit: Payer: Self-pay

## 2021-01-03 ENCOUNTER — Ambulatory Visit (INDEPENDENT_AMBULATORY_CARE_PROVIDER_SITE_OTHER): Payer: Medicaid Other

## 2021-01-03 VITALS — BP 126/83 | HR 83 | Ht 66.0 in | Wt 290.5 lb

## 2021-01-03 DIAGNOSIS — Z3201 Encounter for pregnancy test, result positive: Secondary | ICD-10-CM | POA: Diagnosis not present

## 2021-01-03 DIAGNOSIS — K029 Dental caries, unspecified: Secondary | ICD-10-CM | POA: Diagnosis not present

## 2021-01-03 DIAGNOSIS — Z3200 Encounter for pregnancy test, result unknown: Secondary | ICD-10-CM

## 2021-01-03 LAB — POCT URINE PREGNANCY: Preg Test, Ur: POSITIVE — AB

## 2021-01-03 NOTE — Progress Notes (Addendum)
   NURSE VISIT- PREGNANCY CONFIRMATION   SUBJECTIVE:  Leah Kennedy is a 25 y.o. [phone] female at [redacted]w[redacted]d by certain LMP of Patient's last menstrual period was 12/04/2020 (exact date). Here for pregnancy confirmation.  Home pregnancy test: positive x 8   She reports no complaints.  She is taking prenatal vitamins.    OBJECTIVE:  BP 126/83 (BP Location: Right Arm, Patient Position: Sitting, Cuff Size: Large)   Pulse 83   Ht 5\' 6"  (1.676 m)   Wt 290 lb 8 oz (131.8 kg)   LMP 12/04/2020 (Exact Date)   Breastfeeding No   BMI 46.89 kg/m   Appears well, in no apparent distress OB History  Gravida Para Term Preterm AB Living  4 2 2   1 2   SAB IAB Ectopic Multiple Live Births  1     0 2    # Outcome Date GA Lbr Len/2nd Weight Sex Delivery Anes PTL Lv  4 Current           3 Term 10/18/20 [redacted]w[redacted]d / 00:11 6 lb 9.1 oz (2.98 kg) M Vag-Spont EPI  LIV  2 Term 01/17/19 [redacted]w[redacted]d 10:35 / 00:18 7 lb 5.8 oz (3.34 kg) M Vag-Spont EPI N LIV  1 SAB 05/2017            Results for orders placed or performed in visit on 01/03/21 (from the past 24 hour(s))  POCT urine pregnancy   Collection Time: 01/03/21 10:41 AM  Result Value Ref Range   Preg Test, Ur Positive (A) Negative    ASSESSMENT: Positive pregnancy test, [redacted]w[redacted]d by LMP    PLAN: Schedule for dating ultrasound in 3-4 weeks Prenatal vitamins: continue   Nausea medicines: not currently needed   OB packet given: Yes  Kariyah Baugh A Shyquan Stallbaumer  01/03/2021 10:43 AM   Chart reviewed for nurse visit. Agree with plan of care.  [redacted]w[redacted]d, 01/05/2021 01/03/2021 11:33 AM

## 2021-01-21 HISTORY — PX: TOOTH EXTRACTION: SHX859

## 2021-01-28 ENCOUNTER — Other Ambulatory Visit: Payer: Self-pay | Admitting: Obstetrics & Gynecology

## 2021-01-28 DIAGNOSIS — Z363 Encounter for antenatal screening for malformations: Secondary | ICD-10-CM

## 2021-01-31 ENCOUNTER — Ambulatory Visit (INDEPENDENT_AMBULATORY_CARE_PROVIDER_SITE_OTHER): Payer: Medicaid Other

## 2021-01-31 ENCOUNTER — Other Ambulatory Visit: Payer: Self-pay

## 2021-01-31 DIAGNOSIS — Z3A08 8 weeks gestation of pregnancy: Secondary | ICD-10-CM | POA: Diagnosis not present

## 2021-01-31 DIAGNOSIS — Z363 Encounter for antenatal screening for malformations: Secondary | ICD-10-CM | POA: Diagnosis not present

## 2021-01-31 NOTE — Progress Notes (Signed)
[place] 8+2 wks,single IUP with YS,CRL 18.23 cm,fhr 158 BPM,normal ovaries,simple right adnexal cyst 2 x 1.2 x 1.5 cm

## 2021-02-24 DIAGNOSIS — Z3490 Encounter for supervision of normal pregnancy, unspecified, unspecified trimester: Secondary | ICD-10-CM | POA: Insufficient documentation

## 2021-02-24 DIAGNOSIS — O0990 Supervision of high risk pregnancy, unspecified, unspecified trimester: Secondary | ICD-10-CM | POA: Insufficient documentation

## 2021-02-24 DIAGNOSIS — O09899 Supervision of other high risk pregnancies, unspecified trimester: Secondary | ICD-10-CM | POA: Insufficient documentation

## 2021-02-28 ENCOUNTER — Other Ambulatory Visit: Payer: Self-pay | Admitting: Obstetrics & Gynecology

## 2021-02-28 DIAGNOSIS — Z3682 Encounter for antenatal screening for nuchal translucency: Secondary | ICD-10-CM

## 2021-03-01 ENCOUNTER — Ambulatory Visit (INDEPENDENT_AMBULATORY_CARE_PROVIDER_SITE_OTHER): Payer: Medicaid Other | Admitting: Women's Health

## 2021-03-01 ENCOUNTER — Encounter: Payer: Self-pay | Admitting: Women's Health

## 2021-03-01 ENCOUNTER — Ambulatory Visit (INDEPENDENT_AMBULATORY_CARE_PROVIDER_SITE_OTHER): Payer: Medicaid Other

## 2021-03-01 ENCOUNTER — Ambulatory Visit: Payer: Medicaid Other | Admitting: *Deleted

## 2021-03-01 ENCOUNTER — Other Ambulatory Visit: Payer: Self-pay

## 2021-03-01 VITALS — BP 126/76 | HR 87 | Wt 283.2 lb

## 2021-03-01 DIAGNOSIS — F418 Other specified anxiety disorders: Secondary | ICD-10-CM

## 2021-03-01 DIAGNOSIS — Z3682 Encounter for antenatal screening for nuchal translucency: Secondary | ICD-10-CM

## 2021-03-01 DIAGNOSIS — Z1379 Encounter for other screening for genetic and chromosomal anomalies: Secondary | ICD-10-CM | POA: Diagnosis not present

## 2021-03-01 DIAGNOSIS — O09899 Supervision of other high risk pregnancies, unspecified trimester: Secondary | ICD-10-CM

## 2021-03-01 DIAGNOSIS — Z3A12 12 weeks gestation of pregnancy: Secondary | ICD-10-CM

## 2021-03-01 DIAGNOSIS — Z3480 Encounter for supervision of other normal pregnancy, unspecified trimester: Secondary | ICD-10-CM | POA: Diagnosis not present

## 2021-03-01 DIAGNOSIS — Z3481 Encounter for supervision of other normal pregnancy, first trimester: Secondary | ICD-10-CM | POA: Diagnosis not present

## 2021-03-01 HISTORY — DX: Other specified anxiety disorders: F41.8

## 2021-03-01 LAB — POCT URINALYSIS DIPSTICK OB
Blood, UA: NEGATIVE
Glucose, UA: NEGATIVE
Ketones, UA: NEGATIVE
Leukocytes, UA: NEGATIVE
Nitrite, UA: NEGATIVE

## 2021-03-01 MED ORDER — DOXYLAMINE-PYRIDOXINE 10-10 MG PO TBEC: DELAYED_RELEASE_TABLET | ORAL | 6 refills | Status: DC

## 2021-03-01 MED ORDER — ASPIRIN 81 MG PO TBEC: 81.0000 mg | DELAYED_RELEASE_TABLET | Freq: Every day | ORAL | 3 refills | Status: DC

## 2021-03-01 NOTE — Progress Notes (Signed)
INITIAL OBSTETRICAL VISIT Patient name: Leah Kennedy MRN [id number]  Date of birth: 05-12-1996 Chief Complaint:   Initial Prenatal Visit and Pregnancy Ultrasound  History of Present Illness:   Leah Kennedy is a 24 y.o. [phone] African-American female at [redacted]w[redacted]d by LMP c/w u/s at 8 weeks with an Estimated Date of Delivery: 09/10/21 being seen today for her initial obstetrical visit.   Patient's last menstrual period was 12/04/2020. Her obstetrical history is significant for  term uncomplicated SVB x 2, SAB x1 .   Today she reports  decreased appetite and nausea. Did smoke THC, quit w/ +PT. Some dep/anx, no meds since HS. Declines referral to [facility] or meds now. Denies SI/HI.  Last pap 11/19/19. Results were: NILM w/ HRHPV not done  Depression screen [facility] 2/9 03/01/2021 04/21/2020 11/19/2019 07/01/2018  Decreased Interest 2 0 2 1  Down, Depressed, Hopeless 2 0 0 0  PHQ - 2 Score 4 0 2 1  Altered sleeping 2 2 1 2   Tired, decreased energy 2 2 2 1   Change in appetite 2 2 2 1   Feeling bad or failure about yourself  2 0 1 0  Trouble concentrating 2 0 0 0  Moving slowly or fidgety/restless 0 0 0 0  Suicidal thoughts 0 0 0 0  PHQ-9 Score 14 6 8 5   Difficult doing work/chores - - Not difficult at all -     GAD 7 : Generalized Anxiety Score 03/01/2021 04/21/2020 11/19/2019  Nervous, Anxious, on Edge 2 0 0  Control/stop worrying 2 0 0  Worry too much - different things 2 0 0  Trouble relaxing 2 1 1   Restless 2 0 0  Easily annoyed or irritable 2 0 0  Afraid - awful might happen 2 0 0  Total GAD 7 Score 14 1 1   Anxiety Difficulty - - Not difficult at all     Review of Systems:   Pertinent items are noted in HPI Denies cramping/contractions, leakage of fluid, vaginal bleeding, abnormal vaginal discharge w/ itching/odor/irritation, headaches, visual changes, shortness of breath, chest pain, abdominal pain, severe nausea/vomiting, or problems with urination or bowel movements unless otherwise stated  above.  Pertinent History Reviewed:  Reviewed past medical,surgical, social, obstetrical and family history.  Reviewed problem list, medications and allergies. OB History  Gravida Para Term Preterm AB Living  4 2 2   1 2   SAB IAB Ectopic Multiple Live Births  1     0 2    # Outcome Date GA Lbr Len/2nd Weight Sex Delivery Anes PTL Lv  4 Current           3 Term 10/18/20 [redacted]w[redacted]d / 00:11 6 lb 9.1 oz (2.98 kg) M Vag-Spont EPI  LIV  2 Term 01/17/19 [redacted]w[redacted]d 10:35 / 00:18 7 lb 5.8 oz (3.34 kg) M Vag-Spont EPI N LIV  1 SAB 05/2017           Physical Assessment:   Vitals:   03/01/21 0907  BP: 126/76  Pulse: 87  Weight: 283 lb 3.2 oz (128.5 kg)  Body mass index is 45.71 kg/m.       Physical Examination:  General appearance - well appearing, and in no distress  Mental status - alert, oriented to person, place, and time  Psych:  She has a normal mood and affect  Skin - warm and dry, normal color, no suspicious lesions noted  Chest - effort normal, all lung fields clear to auscultation bilaterally  Heart - normal  rate and regular rhythm  Abdomen - soft, nontender  Extremities:  No swelling or varicosities noted  Thin prep pap is not done   Chaperone: N/A    TODAY'S NT  [place] 12+3 wks,measurements c/w dates,crl 61.54 mm,normal ovaries,NB present,NT 1.5 mm,fhr 147 bpm,posterior placenta,limited view of cord insertion because of fetal position.    Results for orders placed or performed in visit on 03/01/21 (from the past 24 hour(s))  POC Urinalysis Dipstick OB   Collection Time: 03/01/21  9:39 AM  Result Value Ref Range   Color, UA     Clarity, UA     Glucose, UA Negative Negative   Bilirubin, UA     Ketones, UA neg    Spec Grav, UA     Blood, UA neg    pH, UA     POC,PROTEIN,UA Trace Negative, Trace, Small (1+), Moderate (2+), Large (3+), 4+   Urobilinogen, UA     Nitrite, UA neg    Leukocytes, UA Negative Negative   Appearance     Odor      Assessment & Plan:  1) Low-Risk  Pregnancy [id number] at [redacted]w[redacted]d with an Estimated Date of Delivery: 09/10/21   2) Initial OB visit  3) Nausea/decreased appetite> rx diclegis, discussed decreased appetite may be from nausea, dep/anx, and stopping THC. Will see how diclegis does  4) Dep/anx> declines meds/IBH referral, denies SI/HI  Meds:  Meds ordered this encounter  Medications   Doxylamine-Pyridoxine (DICLEGIS) 10-10 MG TBEC    Sig: 2 tabs q hs, if sx persist add 1 tab q am on day 3, if sx persist add 1 tab q afternoon on day 4    Dispense:  100 tablet    Refill:  6    Order Specific Question:   Supervising Provider    Answer:   Lazaro Arms [[id number]]   aspirin 81 MG EC tablet    Sig: Take 1 tablet (81 mg total) by mouth daily. Swallow whole.    Dispense:  90 tablet    Refill:  3    Order Specific Question:   Supervising Provider    Answer:   Duane Lope H [[id number]]     Initial labs obtained Continue prenatal vitamins Reviewed n/v relief measures and warning s/s to report Reviewed recommended weight gain based on pre-gravid BMI Encouraged well-balanced diet Genetic & carrier screening discussed: requests Panorama and NT/IT, Horizon done last pregnancy  Ultrasound discussed; fetal survey: requested CCNC completed> form faxed if has or is planning to apply for medicaid The nature of [facility] [employer] with multiple MDs and other Advanced Practice Providers was explained to patient; also emphasized that fellows, residents, and students are part of our team. Does have home bp cuff. Office bp cuff given: no. Rx sent: n/a. Check bp weekly, let us know if consistently >140/90.   Indications for ASA therapy (per uptodate) OR Two or more of the following: Obesity (BMI>30 kg/m2) Yes Sociodemographic characteristics (African American race, low socioeconomic level) Yes  Follow-up: Return in about 4 weeks (around 03/29/2021) for LROB, 2nd IT, CNM, in person.   Orders Placed This Encounter  Procedures    Urine Culture   GC/Chlamydia Probe Amp   CBC/D/Plt+RPR+Rh+ABO+RubIgG...   Pain Management Screening Profile (10S)   Integrated 1   POC Urinalysis Dipstick OB    Cheral Marker CNM, [facility] 03/01/2021 10:29 AM

## 2021-03-01 NOTE — Progress Notes (Signed)
[place] 12+3 wks,measurements c/w dates,crl 61.54 mm,normal ovaries,NB present,NT 1.5 mm,fhr 147 bpm,posterior placenta

## 2021-03-01 NOTE — Patient Instructions (Signed)
Wendall Stade, thank you for choosing our office today! We appreciate the opportunity to meet your healthcare needs. You may receive a short survey by mail, e-mail, or through [employer]. If you are happy with your care we would appreciate if you could take just a few minutes to complete the survey questions. We read all of your comments and take your feedback very seriously. Thank you again for choosing our office.  [facility] [employer] [facility] at [address]  [facility] at [facility] ([address] [place], [place] [address]) Entrance C, located off of [address] Free 24/7 valet parking   Nausea & Vomiting Have saltine crackers or pretzels by your bed and eat a few bites before you raise your head out of bed in the morning Eat small frequent meals throughout the day instead of large meals Drink plenty of fluids throughout the day to stay hydrated, just don't drink a lot of fluids with your meals.  This can make your stomach fill up faster making you feel sick Do not brush your teeth right after you eat Products with real ginger are good for nausea, like ginger ale and ginger hard candy Make sure it says made with real ginger! Sucking on sour candy like lemon heads is also good for nausea If your prenatal vitamins make you nauseated, take them at night so you will sleep through the nausea Sea Bands If you feel like you need medicine for the nausea & vomiting please let us know If you are unable to keep any fluids or food down please let us know   Constipation Drink plenty of fluid, preferably water, throughout the day Eat foods high in fiber such as fruits, vegetables, and grains Exercise, such as walking, is a good way to keep your bowels regular Drink warm fluids, especially warm prune juice, or decaf coffee Eat a 1/2 cup of real oatmeal (not instant), 1/2 cup applesauce, and 1/2-1 cup warm prune juice every day If needed, you may take Colace (docusate sodium) stool softener  once or twice a day to help keep the stool soft.  If you still are having problems with constipation, you may take Miralax once daily as needed to help keep your bowels regular.   Home Blood Pressure Monitoring for Patients   Your provider has recommended that you check your blood pressure (BP) at least once a week at home. If you do not have a blood pressure cuff at home, one will be provided for you. Contact your provider if you have not received your monitor within 1 week.   Helpful Tips for Accurate Home Blood Pressure Checks  Don't smoke, exercise, or drink caffeine 30 minutes before checking your BP Use the restroom before checking your BP (a full bladder can raise your pressure) Relax in a comfortable upright chair Feet on the ground Left arm resting comfortably on a flat surface at the level of your heart Legs uncrossed Back supported Sit quietly and don't talk Place the cuff on your bare arm Adjust snuggly, so that only two fingertips can fit between your skin and the top of the cuff Check 2 readings separated by at least one minute Keep a log of your BP readings For a visual, please reference this diagram: http://ccnc.care/bpdiagram  Provider Name: [facility]     Phone: [phone]  Zone 1: ALL CLEAR  Continue to monitor your symptoms:  BP reading is less than 140 (top number) or less than 90 (bottom  number)  No right upper stomach pain No headaches or seeing spots No feeling nauseated or throwing up No swelling in face and hands  Zone 2: CAUTION Call your doctor's office for any of the following:  BP reading is greater than 140 (top number) or greater than 90 (bottom number)  Stomach pain under your ribs in the middle or right side Headaches or seeing spots Feeling nauseated or throwing up Swelling in face and hands  Zone 3: EMERGENCY  Seek immediate medical care if you have any of the following:  BP reading is greater than160 (top number) or greater than  110 (bottom number) Severe headaches not improving with Tylenol Serious difficulty catching your breath Any worsening symptoms from Zone 2    First Trimester of Pregnancy The first trimester of pregnancy is from week 1 until the end of week 12 (months 1 through 3). A week after a sperm fertilizes an egg, the egg will implant on the wall of the uterus. This embryo will begin to develop into a baby. Genes from you and your partner are forming the baby. The female genes determine whether the baby is a boy or a girl. At 6-8 weeks, the eyes and face are formed, and the heartbeat can be seen on ultrasound. At the end of 12 weeks, all the baby's organs are formed.  Now that you are pregnant, you will want to do everything you can to have a healthy baby. Two of the most important things are to get good prenatal care and to follow your health care provider's instructions. Prenatal care is all the medical care you receive before the baby's birth. This care will help prevent, find, and treat any problems during the pregnancy and childbirth. BODY CHANGES Your body goes through many changes during pregnancy. The changes vary from woman to woman.  You may gain or lose a couple of pounds at first. You may feel sick to your stomach (nauseous) and throw up (vomit). If the vomiting is uncontrollable, call your health care provider. You may tire easily. You may develop headaches that can be relieved by medicines approved by your health care provider. You may urinate more often. Painful urination may mean you have a bladder infection. You may develop heartburn as a result of your pregnancy. You may develop constipation because certain hormones are causing the muscles that push waste through your intestines to slow down. You may develop hemorrhoids or swollen, bulging veins (varicose veins). Your breasts may begin to grow larger and become tender. Your nipples may stick out more, and the tissue that surrounds them  (areola) may become darker. Your gums may bleed and may be sensitive to brushing and flossing. Dark spots or blotches (chloasma, mask of pregnancy) may develop on your face. This will likely fade after the baby is born. Your menstrual periods will stop. You may have a loss of appetite. You may develop cravings for certain kinds of food. You may have changes in your emotions from day to day, such as being excited to be pregnant or being concerned that something may go wrong with the pregnancy and baby. You may have more vivid and strange dreams. You may have changes in your hair. These can include thickening of your hair, rapid growth, and changes in texture. Some women also have hair loss during or after pregnancy, or hair that feels dry or thin. Your hair will most likely return to normal after your baby is born. WHAT TO EXPECT AT YOUR PRENATAL  VISITS During a routine prenatal visit: You will be weighed to make sure you and the baby are growing normally. Your blood pressure will be taken. Your abdomen will be measured to track your baby's growth. The fetal heartbeat will be listened to starting around week 10 or 12 of your pregnancy. Test results from any previous visits will be discussed. Your health care provider may ask you: How you are feeling. If you are feeling the baby move. If you have had any abnormal symptoms, such as leaking fluid, bleeding, severe headaches, or abdominal cramping. If you have any questions. Other tests that may be performed during your first trimester include: Blood tests to find your blood type and to check for the presence of any previous infections. They will also be used to check for low iron levels (anemia) and Rh antibodies. Later in the pregnancy, blood tests for diabetes will be done along with other tests if problems develop. Urine tests to check for infections, diabetes, or protein in the urine. An ultrasound to confirm the proper growth and development  of the baby. An amniocentesis to check for possible genetic problems. Fetal screens for spina bifida and Down syndrome. You may need other tests to make sure you and the baby are doing well. HOME CARE INSTRUCTIONS  Medicines Follow your health care provider's instructions regarding medicine use. Specific medicines may be either safe or unsafe to take during pregnancy. Take your prenatal vitamins as directed. If you develop constipation, try taking a stool softener if your health care provider approves. Diet Eat regular, well-balanced meals. Choose a variety of foods, such as meat or vegetable-based protein, fish, milk and low-fat dairy products, vegetables, fruits, and whole grain breads and cereals. Your health care provider will help you determine the amount of weight gain that is right for you. Avoid raw meat and uncooked cheese. These carry germs that can cause birth defects in the baby. Eating four or five small meals rather than three large meals a day may help relieve nausea and vomiting. If you start to feel nauseous, eating a few soda crackers can be helpful. Drinking liquids between meals instead of during meals also seems to help nausea and vomiting. If you develop constipation, eat more high-fiber foods, such as fresh vegetables or fruit and whole grains. Drink enough fluids to keep your urine clear or pale yellow. Activity and Exercise Exercise only as directed by your health care provider. Exercising will help you: Control your weight. Stay in shape. Be prepared for labor and delivery. Experiencing pain or cramping in the lower abdomen or low back is a good sign that you should stop exercising. Check with your health care provider before continuing normal exercises. Try to avoid standing for long periods of time. Move your legs often if you must stand in one place for a long time. Avoid heavy lifting. Wear low-heeled shoes, and practice good posture. You may continue to have sex  unless your health care provider directs you otherwise. Relief of Pain or Discomfort Wear a good support bra for breast tenderness.   Take warm sitz baths to soothe any pain or discomfort caused by hemorrhoids. Use hemorrhoid cream if your health care provider approves.   Rest with your legs elevated if you have leg cramps or low back pain. If you develop varicose veins in your legs, wear support hose. Elevate your feet for 15 minutes, 3-4 times a day. Limit salt in your diet. Prenatal Care Schedule your prenatal visits by the  twelfth week of pregnancy. They are usually scheduled monthly at first, then more often in the last 2 months before delivery. Write down your questions. Take them to your prenatal visits. Keep all your prenatal visits as directed by your health care provider. Safety Wear your seat belt at all times when driving. Make a list of emergency phone numbers, including numbers for family, friends, the hospital, and police and fire departments. General Tips Ask your health care provider for a referral to a local prenatal education class. Begin classes no later than at the beginning of month 6 of your pregnancy. Ask for help if you have counseling or nutritional needs during pregnancy. Your health care provider can offer advice or refer you to specialists for help with various needs. Do not use hot tubs, steam rooms, or saunas. Do not douche or use tampons or scented sanitary pads. Do not cross your legs for long periods of time. Avoid cat litter boxes and soil used by cats. These carry germs that can cause birth defects in the baby and possibly loss of the fetus by miscarriage or stillbirth. Avoid all smoking, herbs, alcohol, and medicines not prescribed by your health care provider. Chemicals in these affect the formation and growth of the baby. Schedule a dentist appointment. At home, brush your teeth with a soft toothbrush and be gentle when you floss. SEEK MEDICAL CARE IF:   You have dizziness. You have mild pelvic cramps, pelvic pressure, or nagging pain in the abdominal area. You have persistent nausea, vomiting, or diarrhea. You have a bad smelling vaginal discharge. You have pain with urination. You notice increased swelling in your face, hands, legs, or ankles. SEEK IMMEDIATE MEDICAL CARE IF:  You have a fever. You are leaking fluid from your vagina. You have spotting or bleeding from your vagina. You have severe abdominal cramping or pain. You have rapid weight gain or loss. You vomit blood or material that looks like coffee grounds. You are exposed to [place] measles and have never had them. You are exposed to fifth disease or chickenpox. You develop a severe headache. You have shortness of breath. You have any kind of trauma, such as from a fall or a car accident. Document Released: 07/04/2001 Document Revised: 11/24/2013 Document Reviewed: 05/20/2013 [facility] Patient Information 2015 [place], [place]. This information is not intended to replace advice given to you by your health care provider. Make sure you discuss any questions you have with your health care provider.

## 2021-03-02 LAB — PMP SCREEN PROFILE (10S), URINE
Amphetamine Scrn, Ur: NEGATIVE ng/mL
BARBITURATE SCREEN URINE: NEGATIVE ng/mL
BENZODIAZEPINE SCREEN, URINE: NEGATIVE ng/mL
CANNABINOIDS UR QL SCN: POSITIVE ng/mL — AB
Cocaine (Metab) Scrn, Ur: NEGATIVE ng/mL
Creatinine(Crt), U: 376.3 mg/dL — ABNORMAL HIGH (ref 20.0–300.0)
Methadone Screen, Urine: NEGATIVE ng/mL
OXYCODONE+OXYMORPHONE UR QL SCN: NEGATIVE ng/mL
Opiate Scrn, Ur: NEGATIVE ng/mL
Ph of Urine: 6.1 (ref 4.5–8.9)
Phencyclidine Qn, Ur: NEGATIVE ng/mL
Propoxyphene Scrn, Ur: NEGATIVE ng/mL

## 2021-03-03 LAB — CBC/D/PLT+RPR+RH+ABO+RUBIGG...
Antibody Screen: NEGATIVE
Basophils Absolute: 0 10*3/uL (ref 0.0–0.2)
Basos: 0 %
EOS (ABSOLUTE): 0.1 10*3/uL (ref 0.0–0.4)
Eos: 2 %
HCV Ab: 0.1 s/co ratio (ref 0.0–0.9)
HIV Screen 4th Generation wRfx: NONREACTIVE
Hematocrit: 35.2 % (ref 34.0–46.6)
Hemoglobin: 11.3 g/dL (ref 11.1–15.9)
Hepatitis B Surface Ag: NEGATIVE
Immature Grans (Abs): 0 10*3/uL (ref 0.0–0.1)
Immature Granulocytes: 0 %
Lymphocytes Absolute: 2.2 10*3/uL (ref 0.7–3.1)
Lymphs: 26 %
MCH: 27.2 pg (ref 26.6–33.0)
MCHC: 32.1 g/dL (ref 31.5–35.7)
MCV: 85 fL (ref 79–97)
Monocytes Absolute: 0.6 10*3/uL (ref 0.1–0.9)
Monocytes: 7 %
Neutrophils Absolute: 5.5 10*3/uL (ref 1.4–7.0)
Neutrophils: 65 %
Platelets: 264 10*3/uL (ref 150–450)
RBC: 4.16 x10E6/uL (ref 3.77–5.28)
RDW: 14.1 % (ref 11.7–15.4)
RPR Ser Ql: NONREACTIVE
Rh Factor: POSITIVE
Rubella Antibodies, IGG: 22.8 index (ref >0.99)
WBC: 8.4 10*3/uL (ref 3.4–10.8)

## 2021-03-03 LAB — INTEGRATED 1
Crown Rump Length: 61.5 mm
Gest. Age on Collection Date: 12.4 weeks
Maternal Age at EDD: 23.5 yr
Nuchal Translucency (NT): 1.5 mm
Number of Fetuses: 1
PAPP-A Value: 388.9 ng/mL
Weight: 283 [lb_av]

## 2021-03-03 LAB — URINE CULTURE

## 2021-03-03 LAB — HCV INTERPRETATION

## 2021-03-21 ENCOUNTER — Telehealth: Payer: Self-pay | Admitting: Women's Health

## 2021-03-21 NOTE — Telephone Encounter (Signed)
Leah Kennedy called wanting her lab results.

## 2021-03-29 ENCOUNTER — Ambulatory Visit (INDEPENDENT_AMBULATORY_CARE_PROVIDER_SITE_OTHER): Payer: Medicaid Other | Admitting: Obstetrics & Gynecology

## 2021-03-29 ENCOUNTER — Encounter: Payer: Self-pay | Admitting: Obstetrics & Gynecology

## 2021-03-29 ENCOUNTER — Other Ambulatory Visit: Payer: Self-pay

## 2021-03-29 VITALS — BP 128/81 | HR 100 | Wt 282.0 lb

## 2021-03-29 DIAGNOSIS — Z3A16 16 weeks gestation of pregnancy: Secondary | ICD-10-CM | POA: Diagnosis not present

## 2021-03-29 DIAGNOSIS — Z3480 Encounter for supervision of other normal pregnancy, unspecified trimester: Secondary | ICD-10-CM | POA: Diagnosis not present

## 2021-03-29 NOTE — Progress Notes (Signed)
   LOW-RISK PREGNANCY VISIT Patient name: Leah Kennedy MRN [id number]  Date of birth: [birth year] Chief Complaint:   Routine Prenatal Visit  History of Present Illness:   Camilla Skeen is a 25 y.o. [id number] female at [redacted]w[redacted]d with an Estimated Date of Delivery: 09/10/21 being seen today for ongoing management of a low-risk pregnancy.  Depression screen [facility] 2/9 03/01/2021 04/21/2020 11/19/2019 07/01/2018  Decreased Interest 2 0 2 1  Down, Depressed, Hopeless 2 0 0 0  PHQ - 2 Score 4 0 2 1  Altered sleeping 2 2 1 2   Tired, decreased energy 2 2 2 1   Change in appetite 2 2 2 1   Feeling bad or failure about yourself  2 0 1 0  Trouble concentrating 2 0 0 0  Moving slowly or fidgety/restless 0 0 0 0  Suicidal thoughts 0 0 0 0  PHQ-9 Score 14 6 8 5   Difficult doing work/chores - - Not difficult at all -    Today she reports no complaints. Contractions: Not present. Vag. Bleeding: None.  Movement: Present. denies leaking of fluid. Review of Systems:   Pertinent items are noted in HPI Denies abnormal vaginal discharge w/ itching/odor/irritation, headaches, visual changes, shortness of breath, chest pain, abdominal pain, severe nausea/vomiting, or problems with urination or bowel movements unless otherwise stated above. Pertinent History Reviewed:  Reviewed past medical,surgical, social, obstetrical and family history.  Reviewed problem list, medications and allergies. Physical Assessment:   Vitals:   03/29/21 1417  BP: 128/81  Pulse: 100  Weight: 282 lb (127.9 kg)  Body mass index is 45.52 kg/m.        Physical Examination:   General appearance: Well appearing, and in no distress  Mental status: Alert, oriented to person, place, and time  Skin: Warm & dry  Cardiovascular: Normal heart rate noted  Respiratory: Normal respiratory effort, no distress  Abdomen: Soft, gravid, nontender  Pelvic: Cervical exam deferred         Extremities: Edema: None  Fetal Status: Fetal Heart Rate (bpm): 150    Movement: Present    Chaperone: n/a    No results found for this or any previous visit (from the past 24 hour(s)).  Assessment & Plan:  1) Low-risk pregnancy at [redacted]w[redacted]d with an Estimated Date of Delivery: 09/10/21   2) ,    Meds: No orders of the defined types were placed in this encounter.  Labs/procedures today:   Plan:  Continue routine obstetrical care  Next visit: prefers in person    Reviewed: Preterm labor symptoms and general obstetric precautions including but not limited to vaginal bleeding, contractions, leaking of fluid and fetal movement were reviewed in detail with the patient.  All questions were answered. Has home bp cuff. Rx faxed to . Check bp weekly, let 05/29/21 know if >140/90.   Follow-up: Return in about 4 weeks (around 04/26/2021) for 20 week sono, LROB.  Orders Placed This Encounter  Procedures   [redacted]w[redacted]d OB Comp + 14 Wk   INTEGRATED 2    09/12/21, MD 03/29/2021 2:29 PM

## 2021-03-31 ENCOUNTER — Encounter: Payer: Self-pay | Admitting: Women's Health

## 2021-03-31 LAB — INTEGRATED 2
AFP MoM: 0.51
Alpha-Fetoprotein: 15.8 ng/mL
Crown Rump Length: 61.5 mm
DIA MoM: 1.03
DIA Value: 135.8 pg/mL
Estriol, Unconjugated: 1.17 ng/mL
Gest. Age on Collection Date: 12.4 weeks
Gestational Age: 16.4 weeks
Maternal Age at EDD: 23.5 yr
Nuchal Translucency (NT): 1.5 mm
Nuchal Translucency MoM: 1.09
Number of Fetuses: 1
PAPP-A MoM: 0.91
PAPP-A Value: 388.9 ng/mL
Test Results:: NEGATIVE
Weight: 200 [lb_av]
Weight: 283 [lb_av]
hCG MoM: 0.72
hCG Value: 20.9 IU/mL
uE3 MoM: 1.22

## 2021-04-01 ENCOUNTER — Other Ambulatory Visit: Payer: Self-pay | Admitting: Obstetrics & Gynecology

## 2021-04-01 LAB — GC/CHLAMYDIA PROBE AMP
Chlamydia trachomatis, NAA: POSITIVE — AB
Neisseria Gonorrhoeae by PCR: NEGATIVE

## 2021-04-01 MED ORDER — AZITHROMYCIN 500 MG PO TABS: 1000.0000 mg | ORAL_TABLET | Freq: Once | ORAL | 1 refills | Status: AC

## 2021-04-05 ENCOUNTER — Telehealth: Payer: Self-pay | Admitting: Obstetrics & Gynecology

## 2021-04-05 NOTE — Telephone Encounter (Signed)
Patient states she did see Dr Forestine Chute message regarding chlamydia test positive and wanted to make sure medication was sent in.  I informed her it was sent to [facility] in [place] and refill was available for partner.  Verbalized understanding with no further questions.

## 2021-04-05 NOTE — Telephone Encounter (Signed)
Carnella called to see if her medicines had been sent to [facility] in [place]. Upon verifying, one medicine that Dr Despina Hidden had told her he was sending wasn't on the list. Could you get this sent over for her?

## 2021-04-26 ENCOUNTER — Encounter: Payer: Medicaid Other | Admitting: Obstetrics & Gynecology

## 2021-04-26 ENCOUNTER — Other Ambulatory Visit: Payer: Medicaid Other

## 2021-04-30 ENCOUNTER — Emergency Department (HOSPITAL_COMMUNITY): Payer: Medicaid Other

## 2021-04-30 ENCOUNTER — Emergency Department (HOSPITAL_COMMUNITY)
Admission: EM | Admit: 2021-04-30 | Discharge: 2021-04-30 | Disposition: A | Discharge status: Home / Self Care | Payer: Medicaid Other | Attending: Emergency Medicine | Admitting: Emergency Medicine

## 2021-04-30 ENCOUNTER — Encounter (HOSPITAL_COMMUNITY): Payer: Self-pay | Admitting: Emergency Medicine

## 2021-04-30 ENCOUNTER — Other Ambulatory Visit: Payer: Self-pay

## 2021-04-30 DIAGNOSIS — Z79899 Other long term (current) drug therapy: Secondary | ICD-10-CM | POA: Insufficient documentation

## 2021-04-30 DIAGNOSIS — Z87891 Personal history of nicotine dependence: Secondary | ICD-10-CM | POA: Diagnosis not present

## 2021-04-30 DIAGNOSIS — R0781 Pleurodynia: Secondary | ICD-10-CM | POA: Diagnosis not present

## 2021-04-30 DIAGNOSIS — R0789 Other chest pain: Secondary | ICD-10-CM | POA: Insufficient documentation

## 2021-04-30 LAB — COMPREHENSIVE METABOLIC PANEL
ALT: 10 U/L (ref 0–44)
AST: 14 U/L — ABNORMAL LOW (ref 15–41)
Albumin: 3 g/dL — ABNORMAL LOW (ref 3.5–5.0)
Alkaline Phosphatase: 55 U/L (ref 38–126)
Anion gap: 5 (ref 5–15)
BUN: 5 mg/dL — ABNORMAL LOW (ref 6–20)
CO2: 22 mmol/L (ref 22–32)
Calcium: 8 mg/dL — ABNORMAL LOW (ref 8.9–10.3)
Chloride: 108 mmol/L (ref 98–111)
Creatinine, Ser: 0.51 mg/dL (ref 0.44–1.00)
GFR, Estimated: >60 mL/min (ref >60)
Glucose, Bld: 75 mg/dL (ref 70–99)
Potassium: 3.3 mmol/L — ABNORMAL LOW (ref 3.5–5.1)
Sodium: 135 mmol/L (ref 135–145)
Total Bilirubin: 0.2 mg/dL — ABNORMAL LOW (ref 0.3–1.2)
Total Protein: 6.5 g/dL (ref 6.5–8.1)

## 2021-04-30 LAB — CBC WITH DIFFERENTIAL/PLATELET
Abs Immature Granulocytes: 0.03 10*3/uL (ref 0.00–0.07)
Basophils Absolute: 0 10*3/uL (ref 0.0–0.1)
Basophils Relative: 0 %
Eosinophils Absolute: 0.4 10*3/uL (ref 0.0–0.5)
Eosinophils Relative: 4 %
HCT: 31.1 % — ABNORMAL LOW (ref 36.0–46.0)
Hemoglobin: 10.3 g/dL — ABNORMAL LOW (ref 12.0–15.0)
Immature Granulocytes: 0 %
Lymphocytes Relative: 28 %
Lymphs Abs: 3 10*3/uL (ref 0.7–4.0)
MCH: 29.1 pg (ref 26.0–34.0)
MCHC: 33.1 g/dL (ref 30.0–36.0)
MCV: 87.9 fL (ref 80.0–100.0)
Monocytes Absolute: 0.7 10*3/uL (ref 0.1–1.0)
Monocytes Relative: 6 %
Neutro Abs: 6.8 10*3/uL (ref 1.7–7.7)
Neutrophils Relative %: 62 %
Platelets: 262 10*3/uL (ref 150–400)
RBC: 3.54 MIL/uL — ABNORMAL LOW (ref 3.87–5.11)
RDW: 14.3 % (ref 11.5–15.5)
WBC: 10.9 10*3/uL — ABNORMAL HIGH (ref 4.0–10.5)
nRBC: 0 % (ref 0.0–0.2)

## 2021-04-30 LAB — D-DIMER, QUANTITATIVE: D-Dimer, Quant: 0.46 ug/mL-FEU (ref 0.00–0.50)

## 2021-04-30 MED ORDER — ACETAMINOPHEN 325 MG PO TABS
650.0000 mg | ORAL_TABLET | Freq: Once | ORAL | Status: AC
  Administered 2021-04-30: 650 mg via ORAL
  Filled 2021-04-30: qty 2

## 2021-04-30 NOTE — ED Triage Notes (Signed)
Pt to the ED with complaints of left rib pain that began at 1400 today.  Pt called the after hours OB and was advised to be evaluated.  Pt is [redacted] weeks pregnant.

## 2021-04-30 NOTE — ED Provider Notes (Signed)
[facility] EMERGENCY DEPARTMENT Provider Note   CSN: [id number] Arrival date & time: 04/30/21  1831     History Chief Complaint  Patient presents with   Chest Pain    Silas Sedam is a 25 y.o. female.  Patient complains of left-sided chest pain with movement.  No pain with inspiration  The history is provided by the patient and medical records.  Chest Pain Pain location:  L chest Pain quality: aching   Pain radiates to:  Does not radiate Pain severity:  Mild Onset quality:  Sudden Timing:  Intermittent Chronicity:  New Context: not breathing   Relieved by:  Nothing Exacerbated by: Movement. Associated symptoms: no abdominal pain, no back pain, no cough, no fatigue and no headache       Past Medical History:  Diagnosis Date   Depression    Diabetes mellitus without complication (HCC)    pre-diabetes    Patient Active Problem List   Diagnosis Date Noted   Depression with anxiety 03/01/2021   Short interval between pregnancies affecting pregnancy, antepartum 02/24/2021   Encounter for supervision of normal pregnancy, antepartum 02/24/2021   Rubella non-immune status, antepartum 05/21/2020   Marijuana use 04/27/2020   Morbid obesity (HCC) 08/08/2018    Past Surgical History:  Procedure Laterality Date   TONSILLECTOMY  2017   TOOTH EXTRACTION Right 01/2021   WISDOM TOOTH EXTRACTION Bilateral 2020     OB History     Gravida  4   Para  2   Term  2   Preterm      AB  1   Living  2      SAB  1   IAB      Ectopic      Multiple  0   Live Births  2           Family History  Problem Relation Age of Onset   Stroke Father    Alcohol abuse Father    Pulmonary disease Maternal Grandmother    Drug abuse Maternal Grandmother    Alcohol abuse Maternal Grandfather    Heart murmur Paternal Grandmother    Hypertension Paternal Grandmother    Irritable bowel syndrome Paternal Grandmother    Glaucoma Paternal Grandmother    Hypertension  Paternal Grandfather    Diabetes Paternal Grandfather    Gout Paternal Grandfather    Kidney disease Paternal Grandfather    Glaucoma Paternal Grandfather     Social History   Tobacco Use   Smoking status: Former   Smokeless tobacco: Never  Building services engineer Use: Never used  Substance Use Topics   Alcohol use: Not Currently   Drug use: Not Currently    Types: Marijuana    Comment: before pregnancy    Home Medications Prior to Admission medications   Medication Sig Start Date End Date Taking? Authorizing Provider  acetaminophen (TYLENOL) 325 MG tablet Take 2 tablets (650 mg total) by mouth every 6 (six) hours as needed for mild pain, moderate pain, fever or headache (for pain scale < 4). 10/19/20  Yes Goswick, Skipper Cliche, MD  prenatal vitamin w/FE, FA (PRENATAL 1 + 1) 27-1 MG TABS tablet Take 1 tablet by mouth daily at 12 noon. 04/01/20  Yes Adline Potter, NP  aspirin 81 MG EC tablet Take 1 tablet (81 mg total) by mouth daily. Swallow whole. Patient not taking: No sig reported 03/01/21   Cheral Marker, CNM  azithromycin (ZITHROMAX) 500 MG tablet Take 1,000  mg by mouth once. Patient not taking: No sig reported 04/21/21   [provider]  coconut oil OIL Apply 1 application topically as needed (nipple pain). Patient not taking: No sig reported 10/19/20   Sheila Oats, MD  Doxylamine-Pyridoxine (DICLEGIS) 10-10 MG TBEC 2 tabs q hs, if sx persist add 1 tab q am on day 3, if sx persist add 1 tab q afternoon on day 4 Patient not taking: No sig reported 03/01/21   Cheral Marker, CNM    Allergies    Carrot [daucus carota] and Kiwi extract  Review of Systems   Review of Systems  Constitutional:  Negative for appetite change and fatigue.  HENT:  Negative for congestion, ear discharge and sinus pressure.   Eyes:  Negative for discharge.  Respiratory:  Negative for cough.   Cardiovascular:  Positive for chest pain.  Gastrointestinal:  Negative for abdominal pain and  diarrhea.  Genitourinary:  Negative for frequency and hematuria.  Musculoskeletal:  Negative for back pain.  Skin:  Negative for rash.  Neurological:  Negative for seizures and headaches.  Psychiatric/Behavioral:  Negative for hallucinations.    Physical Exam Updated Vital Signs BP (!) 108/59   Pulse 72   Temp 97.7 F (36.5 C)   Resp 19   Ht 5\' 6"  (1.676 m)   Wt 128.4 kg   LMP 12/04/2020   SpO2 99%   BMI 45.68 kg/m   Physical Exam Vitals and nursing note reviewed.  Constitutional:      Appearance: She is well-developed.  HENT:     Head: Normocephalic.     Nose: Nose normal.  Eyes:     General: No scleral icterus.    Conjunctiva/sclera: Conjunctivae normal.  Neck:     Thyroid: No thyromegaly.  Cardiovascular:     Rate and Rhythm: Normal rate and regular rhythm.     Heart sounds: No murmur heard.   No friction rub. No gallop.  Pulmonary:     Breath sounds: No stridor. No wheezing or rales.  Chest:     Chest wall: Tenderness present.  Abdominal:     General: There is no distension.     Tenderness: There is no abdominal tenderness. There is no rebound.  Musculoskeletal:        General: Normal range of motion.     Cervical back: Neck supple.  Lymphadenopathy:     Cervical: No cervical adenopathy.  Skin:    Findings: No erythema or rash.  Neurological:     Mental Status: She is oriented to person, place, and time.     Motor: No abnormal muscle tone.     Coordination: Coordination normal.  Psychiatric:        Behavior: Behavior normal.    ED Results / Procedures / Treatments   Labs (all labs ordered are listed, but only abnormal results are displayed) Labs Reviewed  CBC WITH DIFFERENTIAL/PLATELET - Abnormal; Notable for the following components:      Result Value   WBC 10.9 (*)    RBC 3.54 (*)    Hemoglobin 10.3 (*)    HCT 31.1 (*)    All other components within normal limits  COMPREHENSIVE METABOLIC PANEL - Abnormal; Notable for the following  components:   Potassium 3.3 (*)    BUN 5 (*)    Calcium 8.0 (*)    Albumin 3.0 (*)    AST 14 (*)    Total Bilirubin 0.2 (*)    All other components within  normal limits  D-DIMER, QUANTITATIVE    EKG None  Radiology DG Chest Port 1 View  Result Date: 04/30/2021 CLINICAL DATA:  LEFT rib pain since 1400 hours today, history of diabetes mellitus, former smoker EXAM: PORTABLE CHEST 1 VIEW COMPARISON:  Portable exam 1856 hours without priors for comparison FINDINGS: Upper normal size of cardiac silhouette. Mediastinal contours and pulmonary vascularity normal. Lungs clear. No pulmonary infiltrate, pleural effusion, or pneumothorax. Osseous structures unremarkable. IMPRESSION: No acute abnormalities. Electronically Signed   By: Ulyses Southward M.D.   On: 04/30/2021 19:13    Procedures Procedures   Medications Ordered in ED Medications  acetaminophen (TYLENOL) tablet 650 mg (650 mg Oral Given 04/30/21 1901)    ED Course  I have reviewed the triage vital signs and the nursing notes.  Pertinent labs & imaging results that were available during my care of the patient were reviewed by me and considered in my medical decision making (see chart for details).    MDM Rules/Calculators/A&P                           CBC chemistries D-dimer unremarkable.  Patient with chest wall tenderness she will take Tylenol follow-up with PCP Final Clinical Impression(s) / ED Diagnoses Final diagnoses:  Atypical chest pain  Chest wall pain    Rx / DC Orders ED Discharge Orders     None        Bethann Berkshire, MD 04/30/21 1954

## 2021-04-30 NOTE — Discharge Instructions (Addendum)
Take Tylenol for pain and follow-up with your doctor if any problem ?

## 2021-05-02 ENCOUNTER — Telehealth: Payer: Self-pay

## 2021-05-02 NOTE — Telephone Encounter (Signed)
Transition Care Management Unsuccessful Follow-up Telephone Call  Date of discharge and from where:  04/30/2021-[place]   Attempts:  1st Attempt  Reason for unsuccessful TCM follow-up call:  Left voice message

## 2021-05-03 NOTE — Telephone Encounter (Signed)
Transition Care Management Unsuccessful Follow-up Telephone Call  Date of discharge and from where:  04/30/2021 from [place]  Attempts:  2nd Attempt  Reason for unsuccessful TCM follow-up call:  Left voice message    

## 2021-05-04 NOTE — Telephone Encounter (Signed)
Transition Care Management Follow-up Telephone Call Date of discharge and from where: 04/30/2021 from [facility] How have you been since you were released from the hospital? Pt stated that she is feeling some better. Pt states that the chest pain comes and goes.  Any questions or concerns? No  Items Reviewed: Did the pt receive and understand the discharge instructions provided? Yes  Medications obtained and verified? Yes  Other? No  Any new allergies since your discharge? No  Dietary orders reviewed? No Do you have support at home? Yes   Functional Questionnaire: (I = Independent and D = Dependent)  ADLs: I Bathing/Dressing- I Meal Prep- I Eating- I Maintaining continence- I Transferring/Ambulation- I Managing Meds- I  Follow up appointments reviewed:  PCP Hospital f/u appt confirmed? No   Specialist Hospital f/u appt confirmed? Yes  Scheduled to see Duane Lope, MD on 05/23/2021 @ 11:50am. Are transportation arrangements needed? No  If their condition worsens, is the pt aware to call PCP or go to the Emergency Dept.? Yes Was the patient provided with contact information for the PCP's office or ED? Yes Was to pt encouraged to call back with questions or concerns? Yes

## 2021-05-17 ENCOUNTER — Telehealth: Payer: Self-pay | Admitting: Obstetrics & Gynecology

## 2021-05-17 NOTE — Telephone Encounter (Signed)
Patient called wanting to see what she could take for a cold.

## 2021-05-17 NOTE — Telephone Encounter (Signed)
Returned pt's call. Two identifiers used. Read off list of pregnancy safe meds to take for cough/cold. Pt confirmed understanding.

## 2021-05-20 ENCOUNTER — Other Ambulatory Visit: Payer: Self-pay | Admitting: Obstetrics & Gynecology

## 2021-05-20 DIAGNOSIS — Z3480 Encounter for supervision of other normal pregnancy, unspecified trimester: Secondary | ICD-10-CM

## 2021-05-20 DIAGNOSIS — Z363 Encounter for antenatal screening for malformations: Secondary | ICD-10-CM

## 2021-05-23 ENCOUNTER — Other Ambulatory Visit: Payer: Self-pay

## 2021-05-23 ENCOUNTER — Encounter: Payer: Self-pay | Admitting: Obstetrics & Gynecology

## 2021-05-23 ENCOUNTER — Ambulatory Visit (INDEPENDENT_AMBULATORY_CARE_PROVIDER_SITE_OTHER): Payer: Medicaid Other | Admitting: Obstetrics & Gynecology

## 2021-05-23 ENCOUNTER — Ambulatory Visit (INDEPENDENT_AMBULATORY_CARE_PROVIDER_SITE_OTHER): Payer: Medicaid Other

## 2021-05-23 VITALS — BP 110/71 | HR 77 | Wt 290.0 lb

## 2021-05-23 DIAGNOSIS — Z3480 Encounter for supervision of other normal pregnancy, unspecified trimester: Secondary | ICD-10-CM | POA: Diagnosis not present

## 2021-05-23 DIAGNOSIS — O09899 Supervision of other high risk pregnancies, unspecified trimester: Secondary | ICD-10-CM

## 2021-05-23 DIAGNOSIS — Z363 Encounter for antenatal screening for malformations: Secondary | ICD-10-CM

## 2021-05-23 DIAGNOSIS — Z3A24 24 weeks gestation of pregnancy: Secondary | ICD-10-CM | POA: Diagnosis not present

## 2021-05-23 NOTE — Progress Notes (Signed)
   LOW-RISK PREGNANCY VISIT Patient name: Leah Kennedy MRN [id number]  Date of birth: 1996/02/14 Chief Complaint:   Routine Prenatal Visit and Pregnancy Ultrasound (Anatomy scan)  History of Present Illness:   Leah Kennedy is a 25 y.o. K2H0623 female at 104w2d with an Estimated Date of Delivery: 09/10/21 being seen today for ongoing management of a low-risk pregnancy.  Depression screen [facility] 2/9 03/01/2021 04/21/2020 11/19/2019 07/01/2018  Decreased Interest 2 0 2 1  Down, Depressed, Hopeless 2 0 0 0  PHQ - 2 Score 4 0 2 1  Altered sleeping 2 2 1 2   Tired, decreased energy 2 2 2 1   Change in appetite 2 2 2 1   Feeling bad or failure about yourself  2 0 1 0  Trouble concentrating 2 0 0 0  Moving slowly or fidgety/restless 0 0 0 0  Suicidal thoughts 0 0 0 0  PHQ-9 Score 14 6 8 5   Difficult doing work/chores - - Not difficult at all -    Today she reports no complaints. Contractions: Not present. Vag. Bleeding: None.  Movement: Present. denies leaking of fluid. Review of Systems:   Pertinent items are noted in HPI Denies abnormal vaginal discharge w/ itching/odor/irritation, headaches, visual changes, shortness of breath, chest pain, abdominal pain, severe nausea/vomiting, or problems with urination or bowel movements unless otherwise stated above. Pertinent History Reviewed:  Reviewed past medical,surgical, social, obstetrical and family history.  Reviewed problem list, medications and allergies. Physical Assessment:   Vitals:   05/23/21 1129  BP: 110/71  Pulse: 77  Weight: 290 lb (131.5 kg)  Body mass index is 46.81 kg/m.        Physical Examination:   General appearance: Well appearing, and in no distress  Mental status: Alert, oriented to person, place, and time  Skin: Warm & dry  Cardiovascular: Normal heart rate noted  Respiratory: Normal respiratory effort, no distress  Abdomen: Soft, gravid, nontender  Pelvic: Cervical exam deferred         Extremities:    Fetal  Status:     Movement: Present    Chaperone: n/a    No results found for this or any previous visit (from the past 24 hour(s)).  Assessment & Plan:  1) Low-risk pregnancy at [redacted]w[redacted]d with an Estimated Date of Delivery: 09/10/21   2) , sonogram today is normal   Meds: No orders of the defined types were placed in this encounter.  Labs/procedures today: sonogram  Plan:  Continue routine obstetrical care  Next visit: prefers in person    Reviewed: Preterm labor symptoms and general obstetric precautions including but not limited to vaginal bleeding, contractions, leaking of fluid and fetal movement were reviewed in detail with the patient.  All questions were answered.  home bp cuff. Rx faxed to . Check bp weekly, let 05/25/21 know if >140/90.   Follow-up: Return in about 4 weeks (around 06/20/2021) for PN2, LROB.  No orders of the defined types were placed in this encounter.   [redacted]w[redacted]d, MD 05/23/2021 11:56 AM

## 2021-05-23 NOTE — Progress Notes (Signed)
[place] 24+2 wks,cephalic,cx 4.3 cm,posterior placenta gr 0,normal ovaries,SVP of fluid 4.8 cm,FHR 138 bpm,EFW 675 g 38%,anatomy complete,no obvious abnormalities

## 2021-06-20 ENCOUNTER — Ambulatory Visit (INDEPENDENT_AMBULATORY_CARE_PROVIDER_SITE_OTHER): Payer: Medicaid Other | Admitting: Women's Health

## 2021-06-20 ENCOUNTER — Other Ambulatory Visit: Payer: Medicaid Other

## 2021-06-20 ENCOUNTER — Encounter: Payer: Self-pay | Admitting: Women's Health

## 2021-06-20 ENCOUNTER — Other Ambulatory Visit: Payer: Self-pay

## 2021-06-20 VITALS — BP 123/81 | HR 87 | Wt 292.0 lb

## 2021-06-20 DIAGNOSIS — Z3A28 28 weeks gestation of pregnancy: Secondary | ICD-10-CM | POA: Diagnosis not present

## 2021-06-20 DIAGNOSIS — Z3483 Encounter for supervision of other normal pregnancy, third trimester: Secondary | ICD-10-CM | POA: Diagnosis not present

## 2021-06-20 DIAGNOSIS — Z3480 Encounter for supervision of other normal pregnancy, unspecified trimester: Secondary | ICD-10-CM

## 2021-06-20 DIAGNOSIS — Z23 Encounter for immunization: Secondary | ICD-10-CM

## 2021-06-20 DIAGNOSIS — Z3482 Encounter for supervision of other normal pregnancy, second trimester: Secondary | ICD-10-CM | POA: Diagnosis not present

## 2021-06-20 DIAGNOSIS — A749 Chlamydial infection, unspecified: Secondary | ICD-10-CM | POA: Diagnosis not present

## 2021-06-20 DIAGNOSIS — O98812 Other maternal infectious and parasitic diseases complicating pregnancy, second trimester: Secondary | ICD-10-CM | POA: Diagnosis not present

## 2021-06-20 DIAGNOSIS — Z131 Encounter for screening for diabetes mellitus: Secondary | ICD-10-CM | POA: Diagnosis not present

## 2021-06-20 NOTE — Patient Instructions (Addendum)
Leah Kennedy, thank you for choosing our office today! We appreciate the opportunity to meet your healthcare needs. You may receive a short survey by mail, e-mail, or through [employer]. If you are happy with your care we would appreciate if you could take just a few minutes to complete the survey questions. We read all of your comments and take your feedback very seriously. Thank you again for choosing our office.  Center for [employer] Team at [facility]  [facility] at [address] ([address] [place], [place] [address]) Entrance C, located off of [address] Free 24/7 valet parking   CLASSES: Go to [email or web address] to register for classes (childbirth, breastfeeding, waterbirth, infant CPR, daddy bootcamp, etc.)  Call the office [phone]) or go to [facility] if: You begin to have strong, frequent contractions Your water breaks.  Sometimes it is a big gush of fluid, sometimes it is just a trickle that keeps getting your panties wet or running down your legs You have vaginal bleeding.  It is normal to have a small amount of spotting if your cervix was checked.  You don't feel your baby moving like normal.  If you don't, get you something to eat and drink and lay down and focus on feeling your baby move.   If your baby is still not moving like normal, you should call the office or go to [facility].  Call the office [phone]) or go to [facility] hospital for these signs of pre-eclampsia: Severe headache that does not go away with Tylenol Visual changes- seeing spots, double, blurred vision Pain under your right breast or upper abdomen that does not go away with Tums or heartburn medicine Nausea and/or vomiting Severe swelling in your hands, feet, and face   Tdap Vaccine It is recommended that you get the Tdap vaccine during the third trimester of EACH pregnancy to help protect your baby from getting pertussis (whooping cough) 27-36 weeks is the BEST time to do  this so that you can pass the protection on to your baby. During pregnancy is better than after pregnancy, but if you are unable to get it during pregnancy it will be offered at the hospital.  You can get this vaccine with [place], at the health department, your family doctor, or some local pharmacies Everyone who will be around your baby should also be up-to-date on their vaccines before the baby comes. Adults (who are not pregnant) only need 1 dose of Tdap during adulthood.   [facility] Pediatricians/Family Doctors [facility]): [address] Colette Ribas, [phone]           [facility]: [address], [phone]                [facility]): [address], [phone] (call to ask if accepting patients) [facility] Department: [address], [place], [phone]    [facility] Pediatricians/Family Doctors [facility]): [phone], [phone] [facility]: [address] [place], [phone] [facility]: [address], [phone]  [facility] Doctors  [facility]): [phone] [facility]: [address] [phone]   [facility] Doctors [facility]: [address] [phone]  [facility] Family Doctors  [facility]: [phone], [phone]  Home Blood Pressure Monitoring for Patients   Your provider has recommended that you check your  blood pressure (BP) at least once a week at home. If you do not have a blood pressure cuff at home, one will be provided for you. Contact your provider if you have not received your monitor within 1 week.   Helpful Tips for Accurate Home Blood Pressure Checks  Don't smoke, exercise, or drink caffeine 30 minutes before checking your BP Use the restroom before checking your BP (a full bladder can raise your  pressure) Relax in a comfortable upright chair Feet on the ground Left arm resting comfortably on a flat surface at the level of your heart Legs uncrossed Back supported Sit quietly and don't talk Place the cuff on your bare arm Adjust snuggly, so that only two fingertips can fit between your skin and the top of the cuff Check 2 readings separated by at least one minute Keep a log of your BP readings For a visual, please reference this diagram: http://ccnc.care/bpdiagram  Provider Name: [facility]     Phone: [phone]  Zone 1: ALL CLEAR  Continue to monitor your symptoms:  BP reading is less than 140 (top number) or less than 90 (bottom number)  No right upper stomach pain No headaches or seeing spots No feeling nauseated or throwing up No swelling in face and hands  Zone 2: CAUTION Call your doctor's office for any of the following:  BP reading is greater than 140 (top number) or greater than 90 (bottom number)  Stomach pain under your ribs in the middle or right side Headaches or seeing spots Feeling nauseated or throwing up Swelling in face and hands  Zone 3: EMERGENCY  Seek immediate medical care if you have any of the following:  BP reading is greater than160 (top number) or greater than 110 (bottom number) Severe headaches not improving with Tylenol Serious difficulty catching your breath Any worsening symptoms from Zone 2   Third Trimester of Pregnancy The third trimester is from week 29 through week 42, months 7 through 9. The third trimester is a time when the fetus is growing rapidly. At the end of the ninth month, the fetus is about 20 inches in length and weighs 6-10 pounds.  BODY CHANGES Your body goes through many changes during pregnancy. The changes vary from woman to woman.  Your weight will continue to increase. You can expect to gain 25-35 pounds (11-16 kg) by the end of the pregnancy. You may begin to get stretch marks on your hips, abdomen,  and breasts. You may urinate more often because the fetus is moving lower into your pelvis and pressing on your bladder. You may develop or continue to have heartburn as a result of your pregnancy. You may develop constipation because certain hormones are causing the muscles that push waste through your intestines to slow down. You may develop hemorrhoids or swollen, bulging veins (varicose veins). You may have pelvic pain because of the weight gain and pregnancy hormones relaxing your joints between the bones in your pelvis. Backaches may result from overexertion of the muscles supporting your posture. You may have changes in your hair. These can include thickening of your hair, rapid growth, and changes in texture. Some women also have hair loss during or after pregnancy, or hair that feels dry or thin. Your hair will most likely return to normal after your baby is born. Your breasts will continue to grow and be tender. A yellow discharge may leak from your breasts called colostrum. Your belly button may stick out. You may   feel short of breath because of your expanding uterus. You may notice the fetus "dropping," or moving lower in your abdomen. You may have a bloody mucus discharge. This usually occurs a few days to a week before labor begins. Your cervix becomes thin and soft (effaced) near your due date. WHAT TO EXPECT AT YOUR PRENATAL EXAMS  You will have prenatal exams every 2 weeks until week 36. Then, you will have weekly prenatal exams. During a routine prenatal visit: You will be weighed to make sure you and the fetus are growing normally. Your blood pressure is taken. Your abdomen will be measured to track your baby's growth. The fetal heartbeat will be listened to. Any test results from the previous visit will be discussed. You may have a cervical check near your due date to see if you have effaced. At around 36 weeks, your caregiver will check your cervix. At the same time, your  caregiver will also perform a test on the secretions of the vaginal tissue. This test is to determine if a type of bacteria, Group B streptococcus, is present. Your caregiver will explain this further. Your caregiver may ask you: What your birth plan is. How you are feeling. If you are feeling the baby move. If you have had any abnormal symptoms, such as leaking fluid, bleeding, severe headaches, or abdominal cramping. If you have any questions. Other tests or screenings that may be performed during your third trimester include: Blood tests that check for low iron levels (anemia). Fetal testing to check the health, activity level, and growth of the fetus. Testing is done if you have certain medical conditions or if there are problems during the pregnancy. FALSE LABOR You may feel small, irregular contractions that eventually go away. These are called Braxton Hicks contractions, or false labor. Contractions may last for hours, days, or even weeks before true labor sets in. If contractions come at regular intervals, intensify, or become painful, it is best to be seen by your caregiver.  SIGNS OF LABOR  Menstrual-like cramps. Contractions that are 5 minutes apart or less. Contractions that start on the top of the uterus and spread down to the lower abdomen and back. A sense of increased pelvic pressure or back pain. A watery or bloody mucus discharge that comes from the vagina. If you have any of these signs before the 37th week of pregnancy, call your caregiver right away. You need to go to the hospital to get checked immediately. HOME CARE INSTRUCTIONS  Avoid all smoking, herbs, alcohol, and unprescribed drugs. These chemicals affect the formation and growth of the baby. Follow your caregiver's instructions regarding medicine use. There are medicines that are either safe or unsafe to take during pregnancy. Exercise only as directed by your caregiver. Experiencing uterine cramps is a good sign to  stop exercising. Continue to eat regular, healthy meals. Wear a good support bra for breast tenderness. Do not use hot tubs, steam rooms, or saunas. Wear your seat belt at all times when driving. Avoid raw meat, uncooked cheese, cat litter boxes, and soil used by cats. These carry germs that can cause birth defects in the baby. Take your prenatal vitamins. Try taking a stool softener (if your caregiver approves) if you develop constipation. Eat more high-fiber foods, such as fresh vegetables or fruit and whole grains. Drink plenty of fluids to keep your urine clear or pale yellow. Take warm sitz baths to soothe any pain or discomfort caused by hemorrhoids. Use hemorrhoid cream if   your caregiver approves. If you develop varicose veins, wear support hose. Elevate your feet for 15 minutes, 3-4 times a day. Limit salt in your diet. Avoid heavy lifting, wear low heal shoes, and practice good posture. Rest a lot with your legs elevated if you have leg cramps or low back pain. Visit your dentist if you have not gone during your pregnancy. Use a soft toothbrush to brush your teeth and be gentle when you floss. A sexual relationship may be continued unless your caregiver directs you otherwise. Do not travel far distances unless it is absolutely necessary and only with the approval of your caregiver. Take prenatal classes to understand, practice, and ask questions about the labor and delivery. Make a trial run to the hospital. Pack your hospital bag. Prepare the baby's nursery. Continue to go to all your prenatal visits as directed by your caregiver. SEEK MEDICAL CARE IF: You are unsure if you are in labor or if your water has broken. You have dizziness. You have mild pelvic cramps, pelvic pressure, or nagging pain in your abdominal area. You have persistent nausea, vomiting, or diarrhea. You have a bad smelling vaginal discharge. You have pain with urination. SEEK IMMEDIATE MEDICAL CARE IF:  You  have a fever. You are leaking fluid from your vagina. You have spotting or bleeding from your vagina. You have severe abdominal cramping or pain. You have rapid weight loss or gain. You have shortness of breath with chest pain. You notice sudden or extreme swelling of your face, hands, ankles, feet, or legs. You have not felt your baby move in over an hour. You have severe headaches that do not go away with medicine. You have vision changes. Document Released: 07/04/2001 Document Revised: 07/15/2013 Document Reviewed: 09/10/2012 [facility] Patient Information 2015 [place], [place]. This information is not intended to replace advice given to you by your health care provider. Make sure you discuss any questions you have with your health care provider.   Considering Waterbirth? Guide for patients at Center for [employer] [facility]) Why consider waterbirth? Gentle birth for babies  Less pain medicine used in labor  May allow for passive descent/less pushing  May reduce perineal tears  More mobility and instinctive maternal position changes  Increased maternal relaxation   Is waterbirth safe? What are the risks of infection, drowning or other complications? Infection:  Very low risk (3.7 % for tub vs 4.8% for bed)  7 in 8000 waterbirths with documented infection  Poorly cleaned equipment most common cause  Slightly lower group B strep transmission rate  Drowning  Maternal:  Very low risk  Related to seizures or fainting  Newborn:  Very low risk. No evidence of increased risk of respiratory problems in multiple large studies  Physiological protection from breathing under water  Avoid underwater birth if there are any fetal complications  Once baby's head is out of the water, keep it out.  Birth complication  Some reports of cord trauma, but risk decreased by bringing baby to surface gradually  No evidence of increased risk of shoulder dystocia. Mothers can usually change positions  faster in water than in a bed, possibly aiding the maneuvers to free the shoulder.   There are 2 things you MUST do to have a waterbirth with [facility]: Attend a waterbirth class at [employer] [facility] at [facility]   3rd Wednesday of every month from 7-9 pm (virtual during COVID) [employer] at [email or web address] or [email or web address] or by calling [phone] Bring [place]  the certificate from the class to your prenatal appointment or send via MyChart Meet with a midwife at 36 weeks* to see if you can still plan a waterbirth and to sign the consent.   *We also recommend that you schedule as many of your prenatal visits with a midwife as possible.    Helpful information: You may want to bring a bathing suit top to the hospital to wear during labor but this is optional.  All other supplies are provided by the hospital. Please arrive at the hospital with signs of active labor, and do not wait at home until late in labor. It takes 45 min- 2 hours for COVID testing, fetal monitoring, and check in to your room to take place, plus transport and filling of the waterbirth tub.    Things that would prevent you from having a waterbirth: Unknown or Positive COVID-19 diagnosis upon admission to hospital* Premature, <37wks  Previous cesarean birth  Presence of thick meconium-stained fluid  Multiple gestation (Twins, triplets, etc.)  Uncontrolled diabetes or gestational diabetes requiring medication  Hypertension diagnosed in pregnancy or preexisting hypertension (gestational hypertension, preeclampsia, or chronic hypertension) Fetal growth restriction (your baby measures less than 10th percentile on ultrasound) Heavy vaginal bleeding  Non-reassuring fetal heart rate  Active infection (MRSA, etc.). Group B Strep is NOT a contraindication for waterbirth.  If your labor has to be induced and induction method requires continuous monitoring of the baby's heart rate  Other risks/issues  identified by your obstetrical provider   Please remember that birth is unpredictable. Under certain unforeseeable circumstances your provider may advise against giving birth in the tub. These decisions will be made on a case-by-case basis and with the safety of you and your baby as our highest priority.   *Please remember that in order to have a waterbirth, you must test Negative to COVID-19 upon admission to the hospital.  Updated 11/01/20

## 2021-06-20 NOTE — Progress Notes (Signed)
LOW-RISK PREGNANCY VISIT Patient name: Leah Kennedy MRN [id number]  Date of birth: [birth year] Chief Complaint:   Routine Prenatal Visit  History of Present Illness:   Leah Kennedy is a 25 y.o. [id number] female at [redacted]w[redacted]d with an Estimated Date of Delivery: 09/10/21 being seen today for ongoing management of a low-risk pregnancy.   Today she reports  just tired, some pelvic pressure, wants waterbirth-went to class last pregnancy . Contractions: Irritability. Vag. Bleeding: None.  Movement: Present. denies leaking of fluid.  Depression screen [facility] 2/9 06/20/2021 03/01/2021 04/21/2020 11/19/2019 07/01/2018  Decreased Interest 1 2 0 2 1  Down, Depressed, Hopeless 1 2 0 0 0  PHQ - 2 Score 2 4 0 2 1  Altered sleeping 3 2 2 1 2   Tired, decreased energy 0 2 2 2 1   Change in appetite 0 2 2 2 1   Feeling bad or failure about yourself  0 2 0 1 0  Trouble concentrating 0 2 0 0 0  Moving slowly or fidgety/restless 0 0 0 0 0  Suicidal thoughts 0 0 0 0 0  PHQ-9 Score 5 14 6 8 5   Difficult doing work/chores - - - Not difficult at all -     GAD 7 : Generalized Anxiety Score 06/20/2021 03/01/2021 04/21/2020 11/19/2019  Nervous, Anxious, on Edge 0 2 0 0  Control/stop worrying 0 2 0 0  Worry too much - different things 0 2 0 0  Trouble relaxing 1 2 1 1   Restless 0 2 0 0  Easily annoyed or irritable 0 2 0 0  Afraid - awful might happen 0 2 0 0  Total GAD 7 Score 1 14 1 1   Anxiety Difficulty - - - Not difficult at all      Review of Systems:   Pertinent items are noted in HPI Denies abnormal vaginal discharge w/ itching/odor/irritation, headaches, visual changes, shortness of breath, chest pain, abdominal pain, severe nausea/vomiting, or problems with urination or bowel movements unless otherwise stated above. Pertinent History Reviewed:  Reviewed past medical,surgical, social, obstetrical and family history.  Reviewed problem list, medications and allergies. Physical Assessment:   Vitals:    06/20/21 1117  BP: 123/81  Pulse: 87  Weight: 292 lb (132.5 kg)  Body mass index is 47.13 kg/m.        Physical Examination:   General appearance: Well appearing, and in no distress  Mental status: Alert, oriented to person, place, and time  Skin: Warm & dry  Cardiovascular: Normal heart rate noted  Respiratory: Normal respiratory effort, no distress  Abdomen: Soft, gravid, nontender  Pelvic: Cervical exam deferred         Extremities: Edema: None  Fetal Status: Fetal Heart Rate (bpm): 130 Fundal Height: 30 cm Movement: Present    Chaperone: N/A   No results found for this or any previous visit (from the past 24 hour(s)).  Assessment & Plan:  1) Low-risk pregnancy 05/01/2021 at [redacted]w[redacted]d with an Estimated Date of Delivery: 09/10/21   2) Wants waterbirth, went to class last pregnancy, certificate scanned in. Gave printed info today  3) Recent +CT> POC today   Meds: No orders of the defined types were placed in this encounter.  Labs/procedures today: flu shot, tdap, and PN2  Plan:  Continue routine obstetrical care  Next visit: prefers in person    Reviewed: Preterm labor symptoms and general obstetric precautions including but not limited to vaginal bleeding, contractions, leaking of fluid and fetal movement were reviewed  in detail with the patient.  All questions were answered. Does have home bp cuff. Office bp cuff given: not applicable. Check bp weekly, let us know if consistently >140 and/or >90.  Follow-up: Return in about 4 weeks (around 07/18/2021) for LROB, CNM, in person.  No future appointments.  Orders Placed This Encounter  Procedures   GC/Chlamydia Probe Amp   Flu Vaccine QUAD 13mo+IM (Fluarix, Fluzone & Alfiuria Quad PF)   Tdap vaccine greater than or equal to 7yo IM   Cheral Marker CNM, [facility] 06/20/2021 11:55 AM

## 2021-06-21 ENCOUNTER — Other Ambulatory Visit: Payer: Self-pay | Admitting: Women's Health

## 2021-06-21 DIAGNOSIS — Z3480 Encounter for supervision of other normal pregnancy, unspecified trimester: Secondary | ICD-10-CM

## 2021-06-21 LAB — CBC
Hematocrit: 31.8 % — ABNORMAL LOW (ref 34.0–46.6)
Hemoglobin: 10.6 g/dL — ABNORMAL LOW (ref 11.1–15.9)
MCH: 28.3 pg (ref 26.6–33.0)
MCHC: 33.3 g/dL (ref 31.5–35.7)
MCV: 85 fL (ref 79–97)
Platelets: 289 10*3/uL (ref 150–450)
RBC: 3.75 x10E6/uL — ABNORMAL LOW (ref 3.77–5.28)
RDW: 13.4 % (ref 11.7–15.4)
WBC: 12.8 10*3/uL — ABNORMAL HIGH (ref 3.4–10.8)

## 2021-06-21 LAB — GLUCOSE TOLERANCE, 2 HOURS W/ 1HR
Glucose, 1 hour: 112 mg/dL (ref 70–179)
Glucose, 2 hour: 107 mg/dL (ref 70–152)
Glucose, Fasting: 88 mg/dL (ref 70–91)

## 2021-06-21 LAB — GC/CHLAMYDIA PROBE AMP
Chlamydia trachomatis, NAA: NEGATIVE
Neisseria Gonorrhoeae by PCR: NEGATIVE

## 2021-06-21 LAB — ANTIBODY SCREEN: Antibody Screen: NEGATIVE

## 2021-06-21 LAB — HIV ANTIBODY (ROUTINE TESTING W REFLEX): HIV Screen 4th Generation wRfx: NONREACTIVE

## 2021-06-21 LAB — RPR: RPR Ser Ql: NONREACTIVE

## 2021-06-21 MED ORDER — FERROUS SULFATE 325 (65 FE) MG PO TABS: 325.0000 mg | ORAL_TABLET | ORAL | 2 refills | Status: DC

## 2021-07-07 ENCOUNTER — Telehealth: Payer: Self-pay

## 2021-07-07 NOTE — Telephone Encounter (Signed)
Pt called office requesting referral to inpatient mental health services. Pt transferred to this nurse. Pt was tearful and explained that she needed a place to go for treatment. Pt stated that she was under a lot of stress and she hadn't been sleeping or eating well. Pt is [redacted]w[redacted]d pregnant with two small children at home. Pt asked if she had thoughts of harming herself and she stated that she did, but denied any plans to carry out those thoughts. When asked how long she'd had these thoughts, she stated it seemed to be building for a while now, but she had not discussed this at any of her prenatal visits. Dr Charlotta Newton consulted and pt was directed to the [facility] at [address] [place]. Pt agreed to go.

## 2021-07-08 ENCOUNTER — Encounter (HOSPITAL_COMMUNITY): Payer: Self-pay

## 2021-07-08 ENCOUNTER — Other Ambulatory Visit: Payer: Self-pay | Admitting: Obstetrics & Gynecology

## 2021-07-08 ENCOUNTER — Other Ambulatory Visit: Payer: Self-pay

## 2021-07-08 ENCOUNTER — Observation Stay (HOSPITAL_COMMUNITY)
Admission: EM | Admit: 2021-07-08 | Discharge: 2021-07-11 | Disposition: A | Discharge status: Home / Self Care | Payer: Medicaid Other | Attending: Obstetrics & Gynecology | Admitting: Obstetrics & Gynecology

## 2021-07-08 DIAGNOSIS — O24913 Unspecified diabetes mellitus in pregnancy, third trimester: Secondary | ICD-10-CM | POA: Diagnosis not present

## 2021-07-08 DIAGNOSIS — R45851 Suicidal ideations: Secondary | ICD-10-CM | POA: Insufficient documentation

## 2021-07-08 DIAGNOSIS — O26893 Other specified pregnancy related conditions, third trimester: Secondary | ICD-10-CM | POA: Diagnosis not present

## 2021-07-08 DIAGNOSIS — F418 Other specified anxiety disorders: Secondary | ICD-10-CM | POA: Diagnosis present

## 2021-07-08 DIAGNOSIS — O09899 Supervision of other high risk pregnancies, unspecified trimester: Secondary | ICD-10-CM

## 2021-07-08 DIAGNOSIS — O99343 Other mental disorders complicating pregnancy, third trimester: Secondary | ICD-10-CM | POA: Diagnosis not present

## 2021-07-08 DIAGNOSIS — Z20822 Contact with and (suspected) exposure to covid-19: Secondary | ICD-10-CM | POA: Insufficient documentation

## 2021-07-08 DIAGNOSIS — O9A213 Injury, poisoning and certain other consequences of external causes complicating pregnancy, third trimester: Secondary | ICD-10-CM | POA: Diagnosis present

## 2021-07-08 DIAGNOSIS — F308 Other manic episodes: Secondary | ICD-10-CM

## 2021-07-08 DIAGNOSIS — O0990 Supervision of high risk pregnancy, unspecified, unspecified trimester: Secondary | ICD-10-CM

## 2021-07-08 DIAGNOSIS — T50902A Poisoning by unspecified drugs, medicaments and biological substances, intentional self-harm, initial encounter: Secondary | ICD-10-CM | POA: Diagnosis present

## 2021-07-08 DIAGNOSIS — T454X2A Poisoning by iron and its compounds, intentional self-harm, initial encounter: Secondary | ICD-10-CM | POA: Diagnosis not present

## 2021-07-08 DIAGNOSIS — Z9151 Personal history of suicidal behavior: Secondary | ICD-10-CM

## 2021-07-08 DIAGNOSIS — Z3A31 31 weeks gestation of pregnancy: Secondary | ICD-10-CM | POA: Diagnosis not present

## 2021-07-08 DIAGNOSIS — O368131 Decreased fetal movements, third trimester, fetus 1: Secondary | ICD-10-CM

## 2021-07-08 DIAGNOSIS — Z3490 Encounter for supervision of normal pregnancy, unspecified, unspecified trimester: Secondary | ICD-10-CM

## 2021-07-08 DIAGNOSIS — Z3A30 30 weeks gestation of pregnancy: Secondary | ICD-10-CM

## 2021-07-08 DIAGNOSIS — E876 Hypokalemia: Secondary | ICD-10-CM | POA: Diagnosis present

## 2021-07-08 HISTORY — DX: Prediabetes: R73.03

## 2021-07-08 HISTORY — DX: Personal history of suicidal behavior: Z91.51

## 2021-07-08 HISTORY — DX: Morbid (severe) obesity due to excess calories: E66.01

## 2021-07-08 LAB — CBC
HCT: 27.9 % — ABNORMAL LOW (ref 36.0–46.0)
Hemoglobin: 9.2 g/dL — ABNORMAL LOW (ref 12.0–15.0)
MCH: 28.1 pg (ref 26.0–34.0)
MCHC: 33 g/dL (ref 30.0–36.0)
MCV: 85.3 fL (ref 80.0–100.0)
Platelets: 228 10*3/uL (ref 150–400)
RBC: 3.27 MIL/uL — ABNORMAL LOW (ref 3.87–5.11)
RDW: 13.7 % (ref 11.5–15.5)
WBC: 9.1 10*3/uL (ref 4.0–10.5)
nRBC: 0 % (ref 0.0–0.2)

## 2021-07-08 LAB — COMPREHENSIVE METABOLIC PANEL
ALT: 20 U/L (ref 0–44)
AST: 23 U/L (ref 15–41)
Albumin: 2.8 g/dL — ABNORMAL LOW (ref 3.5–5.0)
Alkaline Phosphatase: 76 U/L (ref 38–126)
Anion gap: 9 (ref 5–15)
BUN: <5 mg/dL — ABNORMAL LOW (ref 6–20)
CO2: 21 mmol/L — ABNORMAL LOW (ref 22–32)
Calcium: 8.2 mg/dL — ABNORMAL LOW (ref 8.9–10.3)
Chloride: 104 mmol/L (ref 98–111)
Creatinine, Ser: 0.55 mg/dL (ref 0.44–1.00)
GFR, Estimated: >60 mL/min (ref >60)
Glucose, Bld: 98 mg/dL (ref 70–99)
Potassium: 3 mmol/L — ABNORMAL LOW (ref 3.5–5.1)
Sodium: 134 mmol/L — ABNORMAL LOW (ref 135–145)
Total Bilirubin: 0.2 mg/dL — ABNORMAL LOW (ref 0.3–1.2)
Total Protein: 6.4 g/dL — ABNORMAL LOW (ref 6.5–8.1)

## 2021-07-08 LAB — CBC WITH DIFFERENTIAL/PLATELET
Abs Immature Granulocytes: 0.03 10*3/uL (ref 0.00–0.07)
Basophils Absolute: 0 10*3/uL (ref 0.0–0.1)
Basophils Relative: 0 %
Eosinophils Absolute: 0.1 10*3/uL (ref 0.0–0.5)
Eosinophils Relative: 1 %
HCT: 31 % — ABNORMAL LOW (ref 36.0–46.0)
Hemoglobin: 10.3 g/dL — ABNORMAL LOW (ref 12.0–15.0)
Immature Granulocytes: 0 %
Lymphocytes Relative: 24 %
Lymphs Abs: 2.5 10*3/uL (ref 0.7–4.0)
MCH: 28.9 pg (ref 26.0–34.0)
MCHC: 33.2 g/dL (ref 30.0–36.0)
MCV: 87.1 fL (ref 80.0–100.0)
Monocytes Absolute: 0.8 10*3/uL (ref 0.1–1.0)
Monocytes Relative: 8 %
Neutro Abs: 7 10*3/uL (ref 1.7–7.7)
Neutrophils Relative %: 67 %
Platelets: 266 10*3/uL (ref 150–400)
RBC: 3.56 MIL/uL — ABNORMAL LOW (ref 3.87–5.11)
RDW: 13.7 % (ref 11.5–15.5)
WBC: 10.5 10*3/uL (ref 4.0–10.5)
nRBC: 0 % (ref 0.0–0.2)

## 2021-07-08 LAB — RESP PANEL BY RT-PCR (FLU A&B, COVID) ARPGX2
Influenza A by PCR: NEGATIVE
Influenza B by PCR: NEGATIVE
SARS Coronavirus 2 by RT PCR: NEGATIVE

## 2021-07-08 LAB — ETHANOL: Alcohol, Ethyl (B): <10 mg/dL (ref <10)

## 2021-07-08 LAB — BASIC METABOLIC PANEL
Anion gap: 4 — ABNORMAL LOW (ref 5–15)
BUN: <5 mg/dL — ABNORMAL LOW (ref 6–20)
CO2: 21 mmol/L — ABNORMAL LOW (ref 22–32)
Calcium: 8 mg/dL — ABNORMAL LOW (ref 8.9–10.3)
Chloride: 109 mmol/L (ref 98–111)
Creatinine, Ser: 0.51 mg/dL (ref 0.44–1.00)
GFR, Estimated: >60 mL/min (ref >60)
Glucose, Bld: 83 mg/dL (ref 70–99)
Potassium: 3.4 mmol/L — ABNORMAL LOW (ref 3.5–5.1)
Sodium: 134 mmol/L — ABNORMAL LOW (ref 135–145)

## 2021-07-08 LAB — RAPID URINE DRUG SCREEN, HOSP PERFORMED
Amphetamines: NOT DETECTED
Barbiturates: NOT DETECTED
Benzodiazepines: NOT DETECTED
Cocaine: NOT DETECTED
Opiates: NOT DETECTED
Tetrahydrocannabinol: NOT DETECTED

## 2021-07-08 LAB — SALICYLATE LEVEL: Salicylate Lvl: <7 mg/dL — ABNORMAL LOW (ref 7.0–30.0)

## 2021-07-08 LAB — LIPASE, BLOOD: Lipase: 34 U/L (ref 11–51)

## 2021-07-08 LAB — TYPE AND SCREEN
ABO/RH(D): AB POS
ABO/RH(D): AB POS
Antibody Screen: NEGATIVE
Antibody Screen: NEGATIVE

## 2021-07-08 LAB — LACTIC ACID, PLASMA: Lactic Acid, Venous: 1.6 mmol/L (ref 0.5–1.9)

## 2021-07-08 LAB — CBG MONITORING, ED: Glucose-Capillary: 92 mg/dL (ref 70–99)

## 2021-07-08 LAB — IRON
Iron: 224 ug/dL — ABNORMAL HIGH (ref 28–170)
Iron: 189 ug/dL — ABNORMAL HIGH (ref 28–170)

## 2021-07-08 LAB — ACETAMINOPHEN LEVEL: Acetaminophen (Tylenol), Serum: <10 ug/mL — ABNORMAL LOW (ref 10–30)

## 2021-07-08 MED ORDER — CALCIUM CARBONATE ANTACID 500 MG PO CHEW: 2.0000 | CHEWABLE_TABLET | ORAL | Status: DC | PRN

## 2021-07-08 MED ORDER — COMPLETENATE 29-1 MG PO CHEW
1.0000 | CHEWABLE_TABLET | Freq: Every day | ORAL | Status: DC
  Administered 2021-07-08 – 2021-07-11 (×4): 1 via ORAL
  Filled 2021-07-08 (×4): qty 1

## 2021-07-08 MED ORDER — PRENATAL MULTIVITAMIN CH: 1.0000 | ORAL_TABLET | Freq: Every day | ORAL | Status: DC

## 2021-07-08 MED ORDER — POTASSIUM CHLORIDE CRYS ER 20 MEQ PO TBCR: 40.0000 meq | EXTENDED_RELEASE_TABLET | Freq: Two times a day (BID) | ORAL | Status: DC

## 2021-07-08 MED ORDER — SERTRALINE HCL 50 MG PO TABS
25.0000 mg | ORAL_TABLET | Freq: Every day | ORAL | Status: DC
  Administered 2021-07-08 – 2021-07-10 (×3): 25 mg via ORAL
  Filled 2021-07-08 (×3): qty 1

## 2021-07-08 MED ORDER — ONDANSETRON 4 MG PO TBDP
4.0000 mg | ORAL_TABLET | Freq: Four times a day (QID) | ORAL | Status: DC | PRN
  Administered 2021-07-08: 4 mg via ORAL
  Filled 2021-07-08: qty 1

## 2021-07-08 MED ORDER — ZOLPIDEM TARTRATE 5 MG PO TABS: 5.0000 mg | ORAL_TABLET | Freq: Every evening | ORAL | Status: DC | PRN

## 2021-07-08 MED ORDER — ACETAMINOPHEN 325 MG PO TABS: 650.0000 mg | ORAL_TABLET | ORAL | Status: DC | PRN

## 2021-07-08 MED ORDER — QUETIAPINE FUMARATE 25 MG PO TABS
25.0000 mg | ORAL_TABLET | Freq: Every day | ORAL | Status: DC
  Administered 2021-07-08 – 2021-07-10 (×3): 25 mg via ORAL
  Filled 2021-07-08 (×4): qty 1

## 2021-07-08 MED ORDER — POTASSIUM CHLORIDE CRYS ER 20 MEQ PO TBCR
40.0000 meq | EXTENDED_RELEASE_TABLET | Freq: Two times a day (BID) | ORAL | Status: DC
  Administered 2021-07-08 – 2021-07-09 (×3): 40 meq via ORAL
  Filled 2021-07-08 (×3): qty 2

## 2021-07-08 MED ORDER — LACTATED RINGERS IV SOLN: INTRAVENOUS | Status: DC

## 2021-07-08 MED ORDER — LACTATED RINGERS IV BOLUS
20.0000 mL/kg | Freq: Once | INTRAVENOUS | Status: AC
  Administered 2021-07-08: 2568 mL via INTRAVENOUS

## 2021-07-08 MED ORDER — DOCUSATE SODIUM 100 MG PO CAPS
100.0000 mg | ORAL_CAPSULE | Freq: Every day | ORAL | Status: DC
  Administered 2021-07-09: 100 mg via ORAL
  Filled 2021-07-08 (×4): qty 1

## 2021-07-08 MED ORDER — ASPIRIN EC 81 MG PO TBEC
81.0000 mg | DELAYED_RELEASE_TABLET | Freq: Every day | ORAL | Status: DC
  Administered 2021-07-08 – 2021-07-11 (×4): 81 mg via ORAL
  Filled 2021-07-08 (×4): qty 1

## 2021-07-08 NOTE — BH Assessment (Signed)
TTS spoke with Gearldine Bienenstock RN, to put Pt in a private room to complete TTS assessment.  Clinician to call the cart in five minutes.

## 2021-07-08 NOTE — ED Notes (Signed)
Receiving unit unable to take report at this time and will need to call back for report.

## 2021-07-08 NOTE — ED Notes (Signed)
[facility] here to transport patient. 

## 2021-07-08 NOTE — Progress Notes (Addendum)
Dr Macon Large reviews fhr tracing remotely and says that pt may be removed from monitors at this time.  Pt at some point will be transported here to [facility].

## 2021-07-08 NOTE — Progress Notes (Signed)
APED called RROB nurse about pt who is G4P2 at 30.6 wk who reports being suicidal this morning.  She took 7 iron pills in an effort to kill herself.  Pt reports uncomplicated pregnancy.  She denies LOF or VB but does report decreased fetal movement.  Pt has hx of anxiety and depression and is not currently on any medications.  Dr Macon Large notified.  OB would like telepsych consult.

## 2021-07-08 NOTE — Plan of Care (Signed)
Problem: Education: °Goal: Knowledge of General Education information will improve °Description: Including pain rating scale, medication(s)/side effects and non-pharmacologic comfort measures °Outcome: Completed/Met °  °

## 2021-07-08 NOTE — H&P (Signed)
FACULTY PRACTICE ANTEPARTUM ADMISSION HISTORY AND PHYSICAL NOTE   History of Present Illness: Leah Kennedy is a 25 y.o. [id number] at [redacted]w[redacted]d admitted for observation after suicide attempt by drug ingestion.  History remarkable for previous suicide attempt in her teenage years, also history of active depression and anxiety. She presented to [facility] ED this morning and she reported taking seven iron pills (ferrous sulfate 325 mg) around 0500 this morning in an attempt to overdose. She denies taking anything else. She reported wanting to die, but wants her baby to be okay.   She reported nausea, no emesis, no abdominal pain. Endorsed good fetal movement, no vaginal bleeding, leaking of fluid or contractions.  At [facility] ER, her vitals were stable and she had an unremarkable exam. Category 1 FHT tracing was noted, no contractions.  Labs were notable for increased iron level of 224, K 3.0, CO2 21, hemoglobin 10.3, WBC 10.5. Normal EKG.   Poison control was called, they recommended close observation and rechecking of labs, her iron level of 224 did not meet threshold of 350 needed for deferoxamine administration. They did want the iron level lover and the CO2 to remain around 20 or less before she is medically cleared.  Psychiatry was consulted, they said they will evaluate when patient is medically cleared. The decision was made to transfer patient to [facility] for further observation and further evaluation.  On arrival here, patient denies any complaints.  Denies current suicidal ideation.  Patient reports the fetal movement as active. Patient reports uterine contraction  activity as none. Patient reports  vaginal bleeding as none. Patient describes fluid per vagina as none.  Fetal presentation is unsure.  Patient Active Problem List   Diagnosis Date Noted   Suicide attempt by drug ingestion (HCC) 07/08/2021   Previous known suicide attempt as a teenager 07/08/2021   Hypokalemia 07/08/2021    Depression with anxiety 03/01/2021   Short interval between pregnancies affecting pregnancy, antepartum 02/24/2021   Supervision of high-risk pregnancy 02/24/2021   Rubella non-immune status, antepartum 05/21/2020   Marijuana use 04/27/2020   Morbid obesity (HCC) 08/08/2018   Chlamydia 07/15/2018    Past Medical History:  Diagnosis Date   Chlamydia 07/15/2018   tx 07/15/18 POC -/-      +11/19/19  tx 11/24/19, POC 04/21/20: +, tx 04/27/20  POC + 06/07/20, POC neg 09/10/20     +03/29/21  POC 06/20/21  -/-   Depression    Depression with anxiety 03/01/2021   No meds, declines IBH referral   Marijuana use 04/27/2020   Counseled via MyChart 04/27/2020    Counseled via MyChart 03/07/2021     Repeat___    Morbid obesity (HCC)    Prediabetes    Previous known suicide attempt as a teenage 07/08/2021    Past Surgical History:  Procedure Laterality Date   TONSILLECTOMY  2017   TOOTH EXTRACTION Right 01/2021   WISDOM TOOTH EXTRACTION Bilateral 2020    OB History  Gravida Para Term Preterm AB Living  4 2 2   1 2   SAB IAB Ectopic Multiple Live Births  1     0 2    # Outcome Date GA Lbr Len/2nd Weight Sex Delivery Anes PTL Lv  4 Current           3 Term 10/18/20 [redacted]w[redacted]d / 00:11 2980 g M Vag-Spont EPI  LIV  2 Term 01/17/19 [redacted]w[redacted]d 10:35 / 00:18 3340 g M Vag-Spont EPI N LIV  1  SAB 05/2017            Social History   Socioeconomic History   Marital status: Single    Spouse name: Latanya Maudlin   Number of children: 2   Years of education: 12   Highest education level: GED or equivalent  Occupational History   Not on file  Tobacco Use   Smoking status: Former   Smokeless tobacco: Never  Building services engineer Use: Never used  Substance and Sexual Activity   Alcohol use: Not Currently   Drug use: Not Currently    Types: Marijuana    Comment: before pregnancy   Sexual activity: Yes    Birth control/protection: None  Other Topics Concern   Not on file  Social History Narrative   Not on  file   Social Determinants of Health   Financial Resource Strain: Low Risk    Difficulty of Paying Living Expenses: Not hard at all  Food Insecurity: No Food Insecurity   Worried About Programme researcher, broadcasting/film/video in the Last Year: Never true   Ran Out of Food in the Last Year: Never true  Transportation Needs: No Transportation Needs   Lack of Transportation (Medical): No   Lack of Transportation (Non-Medical): No  Physical Activity: Sufficiently Active   Days of Exercise per Week: 3 days   Minutes of Exercise per Session: 60 min  Stress: Stress Concern Present   Feeling of Stress : Very much  Social Connections: Moderately Isolated   Frequency of Communication with Friends and Family: More than three times a week   Frequency of Social Gatherings with Friends and Family: More than three times a week   Attends Religious Services: Never   Database administrator or Organizations: No   Attends Engineer, structural: Never   Marital Status: Living with partner    Family History  Problem Relation Age of Onset   Stroke Father    Alcohol abuse Father    Pulmonary disease Maternal Grandmother    Drug abuse Maternal Grandmother    Alcohol abuse Maternal Grandfather    Heart murmur Paternal Grandmother    Hypertension Paternal Grandmother    Irritable bowel syndrome Paternal Grandmother    Glaucoma Paternal Grandmother    Hypertension Paternal Grandfather    Diabetes Paternal Grandfather    Gout Paternal Grandfather    Kidney disease Paternal Grandfather    Glaucoma Paternal Grandfather     Allergies  Allergen Reactions   Carrot [Daucus Carota] Itching   Kiwi Extract Itching    Medications Prior to Admission  Medication Sig Dispense Refill Last Dose   acetaminophen (TYLENOL) 325 MG tablet Take 2 tablets (650 mg total) by mouth every 6 (six) hours as needed for mild pain, moderate pain, fever or headache (for pain scale < 4).      aspirin 81 MG EC tablet Take 1 tablet (81 mg  total) by mouth daily. Swallow whole. 90 tablet 3    coconut oil OIL Apply 1 application topically as needed (nipple pain). (Patient not taking: Reported on 10/22/2020)  0    Doxylamine-Pyridoxine (DICLEGIS) 10-10 MG TBEC 2 tabs q hs, if sx persist add 1 tab q am on day 3, if sx persist add 1 tab q afternoon on day 4 (Patient not taking: Reported on 04/30/2021) 100 tablet 6    ferrous sulfate 325 (65 FE) MG tablet Take 1 tablet (325 mg total) by mouth every other day. 45 tablet 2  prenatal vitamin w/FE, FA (PRENATAL 1 + 1) 27-1 MG TABS tablet Take 1 tablet by mouth daily at 12 noon. 30 tablet 12     Review of Systems - Negative except for what is mentioned in HPI  Vitals:  BP (!) 102/56    Pulse 60    Temp 98.4 F (36.9 C) (Oral)    Resp 17    Ht 5\' 6"  (1.676 m)    Wt 128.4 kg    LMP 12/04/2020    SpO2 100%    BMI 45.68 kg/m  Physical Examination: CONSTITUTIONAL: Well-developed, obese female in no acute distress.  HENT:  Normocephalic, atraumatic, External right and left ear normal.  EYES: Conjunctivae and EOM are normal. Pupils are equal, round, and reactive to light. No scleral icterus.  NECK: Normal range of motion, supple, no masses SKIN: Skin is warm and dry. No rash noted. Not diaphoretic. No erythema. No pallor. NEUROLOGIC: Alert and oriented to person, place, and time. Normal reflexes, muscle tone coordination. No cranial nerve deficit noted. PSYCHIATRIC: Depressed mood and affect. Normal behavior. Normal judgment and thought content. CARDIOVASCULAR: Normal heart rate noted, regular rhythm RESPIRATORY: Effort and breath sounds normal, no problems with respiration noted ABDOMEN: Soft, nontender, nondistended, gravid. MUSCULOSKELETAL: Normal range of motion. No edema and no tenderness. 2+ distal pulses.  Cervix: Deferred Membranes:intact as per report Fetal Monitoring:Baseline: 130 bpm, Variability: Moderate, Accelerations: Reactive, and Decelerations: Absent Tocometer: Flat  Labs:   Results for orders placed or performed during the hospital encounter of 07/08/21 (from the past 24 hour(s))  CBG monitoring, ED   Collection Time: 07/08/21  8:08 AM  Result Value Ref Range   Glucose-Capillary 92 70 - 99 mg/dL  Comprehensive metabolic panel   Collection Time: 07/08/21  8:10 AM  Result Value Ref Range   Sodium 134 (L) 135 - 145 mmol/L   Potassium 3.0 (L) 3.5 - 5.1 mmol/L   Chloride 104 98 - 111 mmol/L   CO2 21 (L) 22 - 32 mmol/L   Glucose, Bld 98 70 - 99 mg/dL   BUN <5 (L) 6 - 20 mg/dL   Creatinine, Ser 0.55 0.44 - 1.00 mg/dL   Calcium 8.2 (L) 8.9 - 10.3 mg/dL   Total Protein 6.4 (L) 6.5 - 8.1 g/dL   Albumin 2.8 (L) 3.5 - 5.0 g/dL   AST 23 15 - 41 U/L   ALT 20 0 - 44 U/L   Alkaline Phosphatase 76 38 - 126 U/L   Total Bilirubin 0.2 (L) 0.3 - 1.2 mg/dL   GFR, Estimated >60 >60 mL/min   Anion gap 9 5 - 15  CBC with Differential/Platelet   Collection Time: 07/08/21  8:10 AM  Result Value Ref Range   WBC 10.5 4.0 - 10.5 K/uL   RBC 3.56 (L) 3.87 - 5.11 MIL/uL   Hemoglobin 10.3 (L) 12.0 - 15.0 g/dL   HCT 31.0 (L) 36.0 - 46.0 %   MCV 87.1 80.0 - 100.0 fL   MCH 28.9 26.0 - 34.0 pg   MCHC 33.2 30.0 - 36.0 g/dL   RDW 13.7 11.5 - 15.5 %   Platelets 266 150 - 400 K/uL   nRBC 0.0 0.0 - 0.2 %   Neutrophils Relative % 67 %   Neutro Abs 7.0 1.7 - 7.7 K/uL   Lymphocytes Relative 24 %   Lymphs Abs 2.5 0.7 - 4.0 K/uL   Monocytes Relative 8 %   Monocytes Absolute 0.8 0.1 - 1.0 K/uL   Eosinophils Relative 1 %  Eosinophils Absolute 0.1 0.0 - 0.5 K/uL   Basophils Relative 0 %   Basophils Absolute 0.0 0.0 - 0.1 K/uL   Immature Granulocytes 0 %   Abs Immature Granulocytes 0.03 0.00 - 0.07 K/uL  Ethanol   Collection Time: 07/08/21  8:10 AM  Result Value Ref Range   Alcohol, Ethyl (B) <10 <10 mg/dL  Lipase, blood   Collection Time: 07/08/21  8:10 AM  Result Value Ref Range   Lipase 34 11 - 51 U/L  Iron (Free Iron)   Collection Time: 07/08/21  8:10 AM  Result Value Ref  Range   Iron 224 (H) 28 - 123XX123 ug/dL  Salicylate level   Collection Time: 07/08/21  8:10 AM  Result Value Ref Range   Salicylate Lvl [id number] (L) 7.0 - 30.0 mg/dL  Acetaminophen level   Collection Time: 07/08/21  8:10 AM  Result Value Ref Range   Acetaminophen (Tylenol), Serum <10 (L) 10 - 30 ug/mL  Type and screen [facility]   Collection Time: 07/08/21  8:10 AM  Result Value Ref Range   ABO/RH(D) AB POS    Antibody Screen NEG    Sample Expiration      07/11/2021,2359 Performed at [facility], [address], [place], [place] [address]   Rapid urine drug screen (hospital performed)   Collection Time: 07/08/21  9:13 AM  Result Value Ref Range   Opiates NONE DETECTED NONE DETECTED   Cocaine NONE DETECTED NONE DETECTED   Benzodiazepines NONE DETECTED NONE DETECTED   Amphetamines NONE DETECTED NONE DETECTED   Tetrahydrocannabinol NONE DETECTED NONE DETECTED   Barbiturates NONE DETECTED NONE DETECTED  Lactic acid, plasma   Collection Time: 07/08/21  9:18 AM  Result Value Ref Range   Lactic Acid, Venous 1.6 0.5 - 1.9 mmol/L  Resp Panel by RT-PCR (Flu A&B, Covid) Nasopharyngeal Swab   Collection Time: 07/08/21  1:02 PM   Specimen: Nasopharyngeal Swab; Nasopharyngeal(NP) swabs in vial transport medium  Result Value Ref Range   SARS Coronavirus 2 by RT PCR NEGATIVE NEGATIVE   Influenza A by PCR NEGATIVE NEGATIVE   Influenza B by PCR NEGATIVE NEGATIVE   Repeat labs 07/08/21 at 1151  (Epic having issues with linking these, so Haiku used to capture image of hard copy of results)   Assessment and Plan: Principal Problem:   Suicide attempt by drug ingestion ([place]) Active Problems:   Morbid obesity ([place])   Supervision of high-risk pregnancy   Depression with anxiety   Previous known suicide attempt as a teenager   Hypokalemia  Admit to [place] Psychiatry consulted, will follow up recommendations 1:1 sitter ordered, suicide precautions instituted As per Poison  Control, patient is currently medically cleared. Psychiatry notified, will follow up recommendations. Potassium will be repleted, will follow up labs and manage accordingly.  No acute obstetrical concerns, Category 1 FHR tracing, will continue daily NST Routine antenatal care    Verita Schneiders, MD, [place], [facility] for [employer], [place]

## 2021-07-08 NOTE — ED Notes (Addendum)
Spoke with Denny Peon at [facility] L&D and she states that baby looks good and fetal monitoring can be discontinued at this time. Patient is to be transferred to their facility per [facility] MD. Nurse is aware of patient situation.

## 2021-07-08 NOTE — ED Notes (Signed)
OB rapid response nurse notified of patient need for monitoring.
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
## 2021-07-08 NOTE — ED Notes (Signed)
Fetal monitoring placed on patient.

## 2021-07-08 NOTE — ED Provider Notes (Addendum)
[facility] Provider Note   CSN: [id number] Arrival date & time: 07/08/21  [id number]     History Chief Complaint  Patient presents with   Psychiatric Evaluation    Leah Kennedy is a 25 y.o. female.  Patient is [redacted] weeks pregnant.  Due date is September 10, 2021.  Patient is gravida 3 para 2.  [facility] by [facility] here in [place].  Pregnancy to date is had no significant complications.  At 5 AM this morning patient ingested 7 tablets of iron pills.'s ferrous sulfate 325 mg, which is equivalent to 65 mg of elemental iron.  Patient denies ingesting anything else.  It was intentional patient's feeling suicidal.  She wanted to die but she wants the baby to be okay.  Patient states as a teenager she had 1 overdose attempt.  But none since.  She is has the complaint of nausea which is new no vomiting no abdominal pain no diarrhea.  Poison control control will be contacted.  Fetal monitoring will be initiated.  No pelvic pain no vaginal bleeding.      Past Medical History:  Diagnosis Date   Depression    Diabetes mellitus without complication ([place])    pre-diabetes    Patient Active Problem List   Diagnosis Date Noted   Depression with anxiety 03/01/2021   Short interval between pregnancies affecting pregnancy, antepartum 02/24/2021   Encounter for supervision of normal pregnancy, antepartum 02/24/2021   Rubella non-immune status, antepartum 05/21/2020   Marijuana use 04/27/2020   Morbid obesity ([place]) 08/08/2018   Chlamydia 07/15/2018    Past Surgical History:  Procedure Laterality Date   TONSILLECTOMY  2017   TOOTH EXTRACTION Right 01/2021   WISDOM TOOTH EXTRACTION Bilateral 2020     OB History     Gravida  4   Para  2   Term  2   Preterm      AB  1   Living  2      SAB  1   IAB      Ectopic      Multiple  0   Live Births  2           Family History  Problem Relation Age of Onset   Stroke Father    Alcohol  abuse Father    Pulmonary disease Maternal Grandmother    Drug abuse Maternal Grandmother    Alcohol abuse Maternal Grandfather    Heart murmur Paternal Grandmother    Hypertension Paternal Grandmother    Irritable bowel syndrome Paternal Grandmother    Glaucoma Paternal Grandmother    Hypertension Paternal Grandfather    Diabetes Paternal Grandfather    Gout Paternal Grandfather    Kidney disease Paternal Grandfather    Glaucoma Paternal Grandfather     Social History   Tobacco Use   Smoking status: Former   Smokeless tobacco: Never  Scientific laboratory technician Use: Never used  Substance Use Topics   Alcohol use: Not Currently   Drug use: Not Currently    Types: Marijuana    Comment: before pregnancy    Home Medications Prior to Admission medications   Medication Sig Start Date End Date Taking? Authorizing Provider  acetaminophen (TYLENOL) 325 MG tablet Take 2 tablets (650 mg total) by mouth every 6 (six) hours as needed for mild pain, moderate pain, fever or headache (for pain scale < 4). 10/19/20   Randa Ngo, MD  aspirin 81 MG EC tablet  Take 1 tablet (81 mg total) by mouth daily. Swallow whole. 03/01/21   Roma Schanz, CNM  coconut oil OIL Apply 1 application topically as needed (nipple pain). Patient not taking: Reported on 10/22/2020 10/19/20   Randa Ngo, MD  Doxylamine-Pyridoxine (DICLEGIS) 10-10 MG TBEC 2 tabs q hs, if sx persist add 1 tab q am on day 3, if sx persist add 1 tab q afternoon on day 4 Patient not taking: Reported on 04/30/2021 03/01/21   Roma Schanz, CNM  ferrous sulfate 325 (65 FE) MG tablet Take 1 tablet (325 mg total) by mouth every other day. 06/21/21   Roma Schanz, CNM  prenatal vitamin w/FE, FA (PRENATAL 1 + 1) 27-1 MG TABS tablet Take 1 tablet by mouth daily at 12 noon. 04/01/20   Estill Dooms, NP    Allergies    Carrot [daucus carota] and Kiwi extract  Review of Systems   Review of Systems  Constitutional:  Negative for  chills and fever.  HENT:  Negative for ear pain and sore throat.   Eyes:  Negative for pain and visual disturbance.  Respiratory:  Negative for cough and shortness of breath.   Cardiovascular:  Negative for chest pain and palpitations.  Gastrointestinal:  Positive for nausea. Negative for abdominal pain and vomiting.  Genitourinary:  Negative for dysuria and hematuria.  Musculoskeletal:  Negative for arthralgias and back pain.  Skin:  Negative for color change and rash.  Neurological:  Negative for seizures and syncope.  Psychiatric/Behavioral:  Positive for suicidal ideas.   All other systems reviewed and are negative.  Physical Exam Updated Vital Signs BP 120/60    Pulse 90    Temp 98.3 F (36.8 C) (Oral)    Resp (!) 30    Ht 1.676 m (5\' 6" )    Wt 128.4 kg    LMP 12/04/2020    SpO2 97%    BMI 45.68 kg/m   Physical Exam Vitals and nursing note reviewed.  Constitutional:      General: She is not in acute distress.    Appearance: Normal appearance. She is well-developed. She is obese.  HENT:     Head: Normocephalic and atraumatic.  Eyes:     Extraocular Movements: Extraocular movements intact.     Conjunctiva/sclera: Conjunctivae normal.     Pupils: Pupils are equal, round, and reactive to light.  Cardiovascular:     Rate and Rhythm: Normal rate and regular rhythm.     Heart sounds: No murmur heard. Pulmonary:     Effort: Pulmonary effort is normal. No respiratory distress.     Breath sounds: Normal breath sounds.  Abdominal:     Palpations: Abdomen is soft.     Tenderness: There is no abdominal tenderness.     Comments: Gravid uterus above umbilicus, nontender.  Musculoskeletal:        General: No swelling.     Cervical back: Normal range of motion and neck supple.  Skin:    General: Skin is warm and dry.     Capillary Refill: Capillary refill takes less than 2 seconds.  Neurological:     General: No focal deficit present.     Mental Status: She is alert and oriented  to person, place, and time.  Psychiatric:        Mood and Affect: Mood normal.    ED Results / Procedures / Treatments   Labs (all labs ordered are listed, but only abnormal results are displayed) Labs  Reviewed  COMPREHENSIVE METABOLIC PANEL - Abnormal; Notable for the following components:      Result Value   Sodium 134 (*)    Potassium 3.0 (*)    CO2 21 (*)    BUN <5 (*)    Calcium 8.2 (*)    Total Protein 6.4 (*)    Albumin 2.8 (*)    Total Bilirubin 0.2 (*)    All other components within normal limits  CBC WITH DIFFERENTIAL/PLATELET - Abnormal; Notable for the following components:   RBC 3.56 (*)    Hemoglobin 10.3 (*)    HCT 31.0 (*)    All other components within normal limits  SALICYLATE LEVEL - Abnormal; Notable for the following components:   Salicylate Lvl [id number] (*)    All other components within normal limits  ACETAMINOPHEN LEVEL - Abnormal; Notable for the following components:   Acetaminophen (Tylenol), Serum <10 (*)    All other components within normal limits  ETHANOL  LIPASE, BLOOD  RAPID URINE DRUG SCREEN, HOSP PERFORMED  LACTIC ACID, PLASMA  IRON  CBG MONITORING, ED  TYPE AND SCREEN    EKG EKG Interpretation  Date/Time:  Friday July 08 2021 08:00:08 EST Ventricular Rate:  105 PR Interval:  145 QRS Duration: 73 QT Interval:  324 QTC Calculation: 429 R Axis:   58 Text Interpretation: Age not entered, assumed to be  25 years old for purpose of ECG interpretation Sinus tachycardia Borderline T abnormalities, inferior leads Baseline wander in lead(s) II III aVF V1 No significant change since last tracing Confirmed by Fredia Sorrow [phone]) on 07/08/2021 8:19:24 AM  Radiology No results found.  Procedures Procedures   Medications Ordered in ED Medications  lactated ringers infusion (has no administration in time range)  lactated ringers bolus 2,568 mL (2,568 mLs Intravenous New Bag/Given 07/08/21 0818)    ED Course  I have reviewed the  triage vital signs and the nursing notes.  Pertinent labs & imaging results that were available during my care of the patient were reviewed by me and considered in my medical decision making (see chart for details).    MDM Rules/Calculators/A&P                         CRITICAL CARE Performed by: Fredia Sorrow Total critical care time: 95 minutes Critical care time was exclusive of separately billable procedures and treating other patients. Critical care was necessary to treat or prevent imminent or life-threatening deterioration. Critical care was time spent personally by me on the following activities: development of treatment plan with patient and/or surrogate as well as nursing, discussions with consultants, evaluation of patient's response to treatment, examination of patient, obtaining history from patient or surrogate, ordering and performing treatments and interventions, ordering and review of laboratory studies, ordering and review of radiographic studies, pulse oximetry and re-evaluation of patient's condition.  Dr. Ernestina Patches excepting OB/GYN for transfer to MAU due to intentional overdose [redacted] weeks pregnant.  Initial monitoring of the baby without any acute findings.  In conversations with poison control patient will need a minimum of 6 hours observation.  [facility] consult put in.  But they will not formally evaluate till she is completely medically cleared.  But I am certain that they will want to admit her since this definitely was intentional and patient's expressing suicidal ideation.  Initiated call to poison control.  Also based on my review patient elemental iron ingestion if she is accurate about the number  of tablets gives her 3.55 mg/kg because she is large weighs over 100 kg.  Puts her under the mild severity scale.  Mild is 10 to 20 mg/kg of elemental iron.  Patient currently asymptomatic.  However potentially serious overdose and also potentially serious for the baby.  I have  ordered serum iron have ordered labs have ordered lactic acid I have ordered TTS consult but they will not formally consult on her until she is totally medically cleared.  Initial labs coming back white count 10.5 hemoglobin 10.3.  It is the patient's own iron she takes it for anemia.  Initial blood sugar 92.  EKG without any acute findings.  Patient's vital signs without any significant abnormalities.  Serum iron levels have been ordered and are pending.  Discussed with OB/GYN on-call.  They have excepted the patient.  Dr. Alvester Morin is the on-call provider.  Patient currently stable.  Patient will be transferred to MAU L&D for monitoring.  [facility] will do the transfer.     Final Clinical Impression(s) / ED Diagnoses Final diagnoses:  [redacted] weeks gestation of pregnancy  Intentional overdose, initial encounter (HCC)  Suicidal ideation  Iron toxicity, intentional self-harm, initial encounter [facility])    Rx / DC Orders ED Discharge Orders     None        Vanetta Mulders, MD 07/08/21 [phone]  Addendum: Patient's labs are significant for iron level of to 24.  That makes it less than 350 which would be the threshold for giving deferoxamine.  Patient's potassium also a little low at 3.0 will hold on any oral potassium supplement due to having the iron tablets in the gut.  This will need to be monitored.  Patient's potassium level not low enough to need IV potassium at this point time.  Patient is awaiting transfer to L&D at [facility].    Vanetta Mulders, MD 07/08/21 1003    Vanetta Mulders, MD 07/08/21 1004

## 2021-07-08 NOTE — ED Notes (Signed)
Sitter placed with patient at this time.

## 2021-07-08 NOTE — ED Notes (Signed)
Leah Kennedy reports that pt should not receive abdominal xray due to pregnancy. Information relayed to Dr. Deretha Emory and Shawna Orleans, RN.

## 2021-07-08 NOTE — ED Triage Notes (Signed)
Patient states that she feels suicidal. States that that she took 7-8 iron tablets this am in an attempt to hurt self. Reports nausea PTA. States that she does not want to hurt baby, only self. Crying. Currently [redacted] weeks pregnant. Reports overall feeling of being "tired."

## 2021-07-08 NOTE — Progress Notes (Signed)
CSW acknowledges consult and is awaiting psych recommendations.   Blaine Hamper, MSW, LCSW Clinical Social Work [phone]

## 2021-07-09 DIAGNOSIS — T50902D Poisoning by unspecified drugs, medicaments and biological substances, intentional self-harm, subsequent encounter: Secondary | ICD-10-CM | POA: Diagnosis not present

## 2021-07-09 DIAGNOSIS — T50902A Poisoning by unspecified drugs, medicaments and biological substances, intentional self-harm, initial encounter: Secondary | ICD-10-CM | POA: Diagnosis not present

## 2021-07-09 DIAGNOSIS — O368131 Decreased fetal movements, third trimester, fetus 1: Secondary | ICD-10-CM | POA: Diagnosis not present

## 2021-07-09 DIAGNOSIS — F418 Other specified anxiety disorders: Secondary | ICD-10-CM | POA: Diagnosis not present

## 2021-07-09 DIAGNOSIS — F332 Major depressive disorder, recurrent severe without psychotic features: Secondary | ICD-10-CM

## 2021-07-09 DIAGNOSIS — O99343 Other mental disorders complicating pregnancy, third trimester: Secondary | ICD-10-CM | POA: Diagnosis not present

## 2021-07-09 DIAGNOSIS — Z3A30 30 weeks gestation of pregnancy: Secondary | ICD-10-CM | POA: Diagnosis not present

## 2021-07-09 LAB — CBC
HCT: 29.3 % — ABNORMAL LOW (ref 36.0–46.0)
Hemoglobin: 9.2 g/dL — ABNORMAL LOW (ref 12.0–15.0)
MCH: 27.5 pg (ref 26.0–34.0)
MCHC: 31.4 g/dL (ref 30.0–36.0)
MCV: 87.5 fL (ref 80.0–100.0)
Platelets: 251 10*3/uL (ref 150–400)
RBC: 3.35 MIL/uL — ABNORMAL LOW (ref 3.87–5.11)
RDW: 14 % (ref 11.5–15.5)
WBC: 8.7 10*3/uL (ref 4.0–10.5)
nRBC: 0 % (ref 0.0–0.2)

## 2021-07-09 LAB — BASIC METABOLIC PANEL
Anion gap: 4 — ABNORMAL LOW (ref 5–15)
BUN: <5 mg/dL — ABNORMAL LOW (ref 6–20)
CO2: 22 mmol/L (ref 22–32)
Calcium: 7.9 mg/dL — ABNORMAL LOW (ref 8.9–10.3)
Chloride: 111 mmol/L (ref 98–111)
Creatinine, Ser: 0.69 mg/dL (ref 0.44–1.00)
GFR, Estimated: >60 mL/min (ref >60)
Glucose, Bld: 86 mg/dL (ref 70–99)
Potassium: 3.9 mmol/L (ref 3.5–5.1)
Sodium: 137 mmol/L (ref 135–145)

## 2021-07-09 LAB — IRON: Iron: 43 ug/dL (ref 28–170)

## 2021-07-09 NOTE — Progress Notes (Addendum)
CSW met with MOB to complete consult for mental health. CSW observed MOB laying in bed, and sitter at bedside. CSW explained role, and reason for consult. MOB was pleasant, and polite during engagement with CSW. MOB acknowledges overdose on iron pills. MOB reported, history of mood swings, ADHD, PTSD, anxiety, and depression. MOB reported, history of feeling anxious, crying, loss of appetite, isolation, and cutting in teenage years. MOB reported, history of psychotropic medication, but does not recall name. Per chart MOB's symptoms are currently being treated with Zoloft, and Seroquel. MOB reported, she "cant change the way she feels, but she is no longer on the edge". MOB denied any active SI, HI when CSW assessed for safety. CSW recommend MOB to be seen by psych!    Leah Kennedy, MSW, LCSW-A Clinical Social Worker- Weekends [phone]
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
## 2021-07-09 NOTE — Progress Notes (Signed)
[facility] COMPREHENSIVE PROGRESS NOTE  Leah Kennedy is a 25 y.o. [id number] at [redacted]w[redacted]d who is admitted for observation after suicide attempt by drug ingestion.  Estimated Date of Delivery: 09/10/21  Length of Stay:  1 Days. Admitted 07/08/2021   ASSESSMENT: Principal Problem:   Suicide attempt by drug ingestion [facility]) Active Problems:   Morbid obesity ([place])   Supervision of high-risk pregnancy   Depression with anxiety   Previous known suicide attempt as a teenager   Hypokalemia   [redacted] weeks gestation of pregnancy  PLAN: Appreciate Psychiatry recommendations, CSW will help in trying to arrange for inpatient psychiatric admission given recent suicide attempt.  Continue Seroquel and Zoloft as recommended. Will continue suicide precautions, 1:1 sitter services. No acute OB concerns, Category 1 FHR tracing, continue daily NST. Potassium was adequately repleted orally, current level 3.9. Continue routine antenatal care.       SUBJECTIVE: Patient is sleeping in bed, voices desire to go home. Denies current suicidal ideation.  Patient reports good fetal movement.  She reports no uterine contractions, no bleeding and no loss of fluid per vagina.   OBJECTIVE: Vitals:  Blood pressure 104/68, pulse 89, temperature 99.1 F (37.3 C), resp. rate 18, height 5\' 6"  (1.676 m), weight 128.4 kg, last menstrual period 12/04/2020, SpO2 98 %, not currently breastfeeding. Physical Examination: CONSTITUTIONAL: Well-developed, well-nourished female in no acute distress.  NEUROLOGIC: Alert and oriented to person, place, and time. No cranial nerve deficit noted. PSYCHIATRIC: Depressed mood and affect.   CARDIOVASCULAR: Normal heart rate noted, regular rhythm RESPIRATORY: Effort and breath sounds normal, no problems with respiration noted MUSCULOSKELETAL: Normal range of motion. No edema and no tenderness. 2+ distal pulses. ABDOMEN: Soft, nontender, nondistended, gravid. CERVIX:   Deferred  Fetal monitoring: FHR: 125 bpm, Variability: moderate, Accelerations: Present, Decelerations: Absent  Uterine activity: no contractions   Results for orders placed or performed during the hospital encounter of 07/08/21 (from the past 48 hour(s))  CBG monitoring, ED     Status: None   Collection Time: 07/08/21  8:08 AM  Result Value Ref Range   Glucose-Capillary 92 70 - 99 mg/dL    Comment: Glucose reference range applies only to samples taken after fasting for at least 8 hours.  Comprehensive metabolic panel     Status: Abnormal   Collection Time: 07/08/21  8:10 AM  Result Value Ref Range   Sodium 134 (L) 135 - 145 mmol/L   Potassium 3.0 (L) 3.5 - 5.1 mmol/L   Chloride 104 98 - 111 mmol/L   CO2 21 (L) 22 - 32 mmol/L   Glucose, Bld 98 70 - 99 mg/dL    Comment: Glucose reference range applies only to samples taken after fasting for at least 8 hours.   BUN <5 (L) 6 - 20 mg/dL   Creatinine, Ser 0.55 0.44 - 1.00 mg/dL   Calcium 8.2 (L) 8.9 - 10.3 mg/dL   Total Protein 6.4 (L) 6.5 - 8.1 g/dL   Albumin 2.8 (L) 3.5 - 5.0 g/dL   AST 23 15 - 41 U/L   ALT 20 0 - 44 U/L   Alkaline Phosphatase 76 38 - 126 U/L   Total Bilirubin 0.2 (L) 0.3 - 1.2 mg/dL   GFR, Estimated >60 >60 mL/min    Comment: (NOTE) Calculated using the CKD-EPI Creatinine Equation (2021)    Anion gap 9 5 - 15    Comment: Performed at [facility], [address], [place], [place] [address]  CBC with Differential/Platelet  Status: Abnormal   Collection Time: 07/08/21  8:10 AM  Result Value Ref Range   WBC 10.5 4.0 - 10.5 K/uL   RBC 3.56 (L) 3.87 - 5.11 MIL/uL   Hemoglobin 10.3 (L) 12.0 - 15.0 g/dL   HCT 78.4 (L) 69.6 - 29.5 %   MCV 87.1 80.0 - 100.0 fL   MCH 28.9 26.0 - 34.0 pg   MCHC 33.2 30.0 - 36.0 g/dL   RDW 28.4 13.2 - 44.0 %   Platelets 266 150 - 400 K/uL   nRBC 0.0 0.0 - 0.2 %   Neutrophils Relative % 67 %   Neutro Abs 7.0 1.7 - 7.7 K/uL   Lymphocytes Relative 24 %   Lymphs Abs 2.5 0.7 - 4.0  K/uL   Monocytes Relative 8 %   Monocytes Absolute 0.8 0.1 - 1.0 K/uL   Eosinophils Relative 1 %   Eosinophils Absolute 0.1 0.0 - 0.5 K/uL   Basophils Relative 0 %   Basophils Absolute 0.0 0.0 - 0.1 K/uL   Immature Granulocytes 0 %   Abs Immature Granulocytes 0.03 0.00 - 0.07 K/uL    Comment: Performed at [facility], [address], [place], [place] [address]  Ethanol     Status: None   Collection Time: 07/08/21  8:10 AM  Result Value Ref Range   Alcohol, Ethyl (B) <10 <10 mg/dL    Comment: (NOTE) Lowest detectable limit for serum alcohol is 10 mg/dL.  For medical purposes only. Performed at [facility], [address] [place], [place] [address]   Lipase, blood     Status: None   Collection Time: 07/08/21  8:10 AM  Result Value Ref Range   Lipase 34 11 - 51 U/L    Comment: Performed at [facility], [address], [place], [place] [address]  Type and screen [facility]     Status: None   Collection Time: 07/08/21  8:10 AM  Result Value Ref Range   ABO/RH(D) AB POS    Antibody Screen NEG    Sample Expiration      07/11/2021,2359 Performed at [facility], [address], [place], [place] [address]   Iron (Free Iron)     Status: Abnormal   Collection Time: 07/08/21  8:10 AM  Result Value Ref Range   Iron 224 (H) 28 - 170 ug/dL    Comment: Performed at [facility], [address], [place], [place] [address]  Salicylate level     Status: Abnormal   Collection Time: 07/08/21  8:10 AM  Result Value Ref Range   Salicylate Lvl <7.0 (L) 7.0 - 30.0 mg/dL    Comment: Performed at [facility], [address] [place], [place] [address]  Acetaminophen level     Status: Abnormal   Collection Time: 07/08/21  8:10 AM  Result Value Ref Range   Acetaminophen (Tylenol), Serum <10 (L) 10 - 30 ug/mL    Comment: (NOTE) Therapeutic concentrations vary significantly. A range of 10-30 ug/mL  may be an effective concentration for many patients. However, some  are best treated at  concentrations outside of this range. Acetaminophen concentrations >150 ug/mL at 4 hours after ingestion  and >50 ug/mL at 12 hours after ingestion are often associated with  toxic reactions.  Performed at [facility], [address], [place], [place] [address]   Rapid urine drug screen (hospital performed)     Status: None   Collection Time: 07/08/21  9:13 AM  Result Value Ref Range  Opiates NONE DETECTED NONE DETECTED   Cocaine NONE DETECTED NONE DETECTED   Benzodiazepines NONE DETECTED NONE DETECTED   Amphetamines NONE DETECTED NONE DETECTED   Tetrahydrocannabinol NONE DETECTED NONE DETECTED   Barbiturates NONE DETECTED NONE DETECTED    Comment: (NOTE) DRUG SCREEN FOR MEDICAL PURPOSES ONLY.  IF CONFIRMATION IS NEEDED FOR ANY PURPOSE, NOTIFY LAB WITHIN 5 DAYS.  LOWEST DETECTABLE LIMITS FOR URINE DRUG SCREEN Drug Class                     Cutoff (ng/mL) Amphetamine and metabolites    1000 Barbiturate and metabolites    200 Benzodiazepine                 [id number] Tricyclics and metabolites     300 Opiates and metabolites        300 Cocaine and metabolites        300 THC                            50 Performed at [facility], [address] [place], [place] [address]   Lactic acid, plasma     Status: None   Collection Time: 07/08/21  9:18 AM  Result Value Ref Range   Lactic Acid, Venous 1.6 0.5 - 1.9 mmol/L    Comment: Performed at [facility], [address] [place], [place] [address]  Resp Panel by RT-PCR (Flu A&B, Covid) Nasopharyngeal Swab     Status: None   Collection Time: 07/08/21  1:02 PM   Specimen: Nasopharyngeal Swab; Nasopharyngeal(NP) swabs in vial transport medium  Result Value Ref Range   SARS Coronavirus 2 by RT PCR NEGATIVE NEGATIVE    Comment: (NOTE) SARS-CoV-2 target nucleic acids are NOT DETECTED.  The SARS-CoV-2 RNA is generally detectable in upper respiratory specimens during the acute phase of infection. The lowest concentration of SARS-CoV-2 viral  copies this assay can detect is 138 copies/mL. A negative result does not preclude SARS-Cov-2 infection and should not be used as the sole basis for treatment or other patient management decisions. A negative result may occur with  improper specimen collection/handling, submission of specimen other than nasopharyngeal swab, presence of viral mutation(s) within the areas targeted by this assay, and inadequate number of viral copies(<138 copies/mL). A negative result must be combined with clinical observations, patient history, and epidemiological information. The expected result is Negative.  Fact Sheet for Patients:  EntrepreneurPulse.com.au  Fact Sheet for Healthcare Providers:  IncredibleEmployment.be  This test is no t yet approved or cleared by the [place] FDA and  has been authorized for detection and/or diagnosis of SARS-CoV-2 by FDA under an Emergency Use Authorization (EUA). This EUA will remain  in effect (meaning this test can be used) for the duration of the COVID-19 declaration under Section 564(b)(1) of the Act, 21 U.S.C.section 360bbb-3(b)(1), unless the authorization is terminated  or revoked sooner.       Influenza A by PCR NEGATIVE NEGATIVE   Influenza B by PCR NEGATIVE NEGATIVE    Comment: (NOTE) The Xpert Xpress SARS-CoV-2/FLU/RSV plus assay is intended as an aid in the diagnosis of influenza from Nasopharyngeal swab specimens and should not be used as a sole basis for treatment. Nasal washings and aspirates are unacceptable for Xpert Xpress SARS-CoV-2/FLU/RSV testing.  Fact Sheet for Patients: EntrepreneurPulse.com.au  Fact Sheet for Healthcare Providers: IncredibleEmployment.be  This test is not yet approved or cleared by the [place] FDA  and has been authorized for detection and/or diagnosis of SARS-CoV-2 by FDA under an Emergency Use Authorization (EUA). This EUA will  remain in effect (meaning this test can be used) for the duration of the COVID-19 declaration under Section 564(b)(1) of the Act, 21 U.S.C. section 360bbb-3(b)(1), unless the authorization is terminated or revoked.  Performed at [facility], [address], [place], [place] [address]   Basic metabolic panel     Status: Abnormal   Collection Time: 07/09/21  4:42 AM  Result Value Ref Range   Sodium 137 135 - 145 mmol/L   Potassium 3.9 3.5 - 5.1 mmol/L   Chloride 111 98 - 111 mmol/L   CO2 22 22 - 32 mmol/L   Glucose, Bld 86 70 - 99 mg/dL    Comment: Glucose reference range applies only to samples taken after fasting for at least 8 hours.   BUN <5 (L) 6 - 20 mg/dL   Creatinine, Ser 6.04 0.44 - 1.00 mg/dL   Calcium 7.9 (L) 8.9 - 10.3 mg/dL   GFR, Estimated >54 >09 mL/min    Comment: (NOTE) Calculated using the CKD-EPI Creatinine Equation (2021)    Anion gap 4 (L) 5 - 15    Comment: Performed at [facility], [address] [place], [place] [address]  Iron     Status: None   Collection Time: 07/09/21  4:42 AM  Result Value Ref Range   Iron 43 28 - 170 ug/dL    Comment: Performed at [facility], [address] [place], [place] [address]  CBC     Status: Abnormal   Collection Time: 07/09/21  4:42 AM  Result Value Ref Range   WBC 8.7 4.0 - 10.5 K/uL   RBC 3.35 (L) 3.87 - 5.11 MIL/uL   Hemoglobin 9.2 (L) 12.0 - 15.0 g/dL   HCT 56.2 (L) 13.0 - 86.5 %   MCV 87.5 80.0 - 100.0 fL   MCH 27.5 26.0 - 34.0 pg   MCHC 31.4 30.0 - 36.0 g/dL   RDW 78.4 69.6 - 29.5 %   Platelets 251 150 - 400 K/uL   nRBC 0.0 0.0 - 0.2 %    Comment: Performed at [facility], [address] [place] [address] [place], [place] [address]    No results found.  Current scheduled medications  aspirin EC  81 mg Oral Daily   docusate sodium  100 mg Oral Daily   potassium chloride  40 mEq Oral BID   prenatal vitamin w/FE, FA  1 tablet Oral Q1200   QUEtiapine  25 mg Oral QHS   sertraline  25 mg Oral  QHS    I have reviewed the patient's current medications.    Jaynie Collins, MD, FACOG Obstetrician & Gynecologist, [facility] for [employer], [facility]

## 2021-07-10 DIAGNOSIS — F418 Other specified anxiety disorders: Secondary | ICD-10-CM | POA: Diagnosis not present

## 2021-07-10 DIAGNOSIS — O99343 Other mental disorders complicating pregnancy, third trimester: Secondary | ICD-10-CM | POA: Diagnosis not present

## 2021-07-10 DIAGNOSIS — T50902A Poisoning by unspecified drugs, medicaments and biological substances, intentional self-harm, initial encounter: Secondary | ICD-10-CM | POA: Diagnosis not present

## 2021-07-10 DIAGNOSIS — O368131 Decreased fetal movements, third trimester, fetus 1: Secondary | ICD-10-CM | POA: Diagnosis not present

## 2021-07-10 DIAGNOSIS — Z3A30 30 weeks gestation of pregnancy: Secondary | ICD-10-CM | POA: Diagnosis not present

## 2021-07-10 LAB — TYPE AND SCREEN
ABO/RH(D): AB POS
Antibody Screen: NEGATIVE

## 2021-07-10 MED ORDER — ENOXAPARIN SODIUM 60 MG/0.6ML IJ SOSY
60.0000 mg | PREFILLED_SYRINGE | INTRAMUSCULAR | Status: DC
  Administered 2021-07-10: 09:00:00 60 mg via SUBCUTANEOUS
  Filled 2021-07-10: qty 0.6

## 2021-07-10 NOTE — Progress Notes (Signed)
IVC papers signed and issued by [employer] as ordered by provider. Patient made aware of IVC status.

## 2021-07-10 NOTE — Progress Notes (Signed)
CSW sent out referrals for inpatient placement via Epic.   Dolores Frame, MSW, LCSW-A Clinical Social Worker- Weekends [phone]

## 2021-07-10 NOTE — Progress Notes (Signed)
Dr. Renaldo Fiddler contacted about possibility of patient being reevaluated and patient requesting discharge. MD made aware of RN concern of patient leaving AMA.

## 2021-07-10 NOTE — Progress Notes (Signed)
[facility]AC[facility] PRACTICE ANTEPARTUM COMPREHENSIVE PROGRESS NOTE  Leah Kennedy is a 25 y.o. [id number] at [redacted]w[redacted]d who is admitted for observation after suicide attempt by drug ingestion.  Estimated Date of Delivery: 09/10/21  Length of Stay:  2 Days. Admitted 07/08/2021   ASSESSMENT: Principal Problem:   Suicide attempt by drug ingestion [facility]) Active Problems:   Morbid obesity ([facility])   Supervision of high-risk pregnancy   Depression with anxiety   Previous known suicide attempt as a teenager   Hypokalemia   [redacted] weeks gestation of pregnancy  PLAN: *Psych: Appreciate Psychiatry recommendations, CSW will help in trying to arrange for inpatient psychiatric admission given recent suicide attempt.  -Continue Seroquel and Zoloft as recommended. -Will continue suicide precautions, 1:1 sitter services. *Pregnancy:No acute OB concerns; patient with reactive non stress test. Continue daily NST. Continue low dose aspirin as pre-eclampsia prophylaxis.  *PPx: lovenox ordered ordered *Dispo: pending placement at inpatient psych facility. SW continuing to work on this   SUBJECTIVE: Patient reports good fetal movement.  She reports no uterine contractions, no bleeding and no loss of fluid per vagina.   OBJECTIVE: Vitals:  Blood pressure (!) 101/58, pulse 65, temperature 98.6 F (37 C), temperature source Oral, resp. rate 17, height 5\' 6"  (1.676 m), weight 128.4 kg, last menstrual period 12/04/2020, SpO2 99 %, not currently breastfeeding. Physical Examination: CONSTITUTIONAL: Well-developed, well-nourished female in no acute distress.  NEUROLOGIC: Awake and alert CARDIOVASCULAR: Normal heart rate noted, regular rhythm RESPIRATORY: Effort and breath sounds normal, no problems with respiration noted ABDOMEN: Soft, nontender, nondistended, gravid. CERVIX:  Deferred  Fetal monitoring: FHR: 130 bpm, Variability: moderate, Accelerations: Present, Decelerations: Absent  Uterine activity: no contractions   Labs:  no new labs Radiology: no new imaging  Current scheduled medications  aspirin EC  81 mg Oral Daily   docusate sodium  100 mg Oral Daily   prenatal vitamin w/FE, FA  1 tablet Oral Q1200   QUEtiapine  25 mg Oral QHS   sertraline  25 mg Oral QHS   I have reviewed the patient's current medications.   12/06/2020 MD Attending Center for [facility] (Faculty Practice) GYN Consult Phone: [phone] (M-F, [phone]) & [phone] (Off hours, weekends, holidays)

## 2021-07-10 NOTE — Progress Notes (Addendum)
CSW spoke to [place] at [facility] [phone], Minette Headland, RN at [facility] [phone], and [facility], regarding placement. Per hospitals, they do not have any appropriate beds available at this time.   Dolores Frame, MSW, LCSW-A Clinical Social Worker- Weekends [phone]

## 2021-07-10 NOTE — Progress Notes (Signed)
Patient requests to be discharged in good faith. Patient states no interest in harming herself or others, and states "I just need to see my kids for Christmas." Patient also states she "feels alone here." RN will contact psychologist for update.

## 2021-07-10 NOTE — Progress Notes (Signed)
Dr. Renaldo Fiddler (Psychiatry) recommended involuntary commitment of patient given recent suicide attempt and patient's desire to leave.  Paperwork filled out for this with the help of Raquel Sarna, RNC [facility]) and Dolores Frame, LCSWA. The completed papers will be faxed to [facility] and police will apparently come to serve papers to the patient.  Will keep inpatient as recommended, CSW is helping with arranging for inpatient psychiatric admission.   Jaynie Collins, MD, FACOG Obstetrician & Gynecologist, [facility] for [employer], [facility] [place] [facility]

## 2021-07-10 NOTE — Progress Notes (Signed)
RN explained to patient policy as related to IVC visitation and regulations. RN engaged in therapeutic conversation with patient. Patient presents as calm and cooperative. She states she "would like to be reevaluated by a psych Dr. In the morning."

## 2021-07-11 ENCOUNTER — Other Ambulatory Visit (HOSPITAL_COMMUNITY): Payer: Self-pay

## 2021-07-11 DIAGNOSIS — O0993 Supervision of high risk pregnancy, unspecified, third trimester: Secondary | ICD-10-CM | POA: Diagnosis not present

## 2021-07-11 DIAGNOSIS — F418 Other specified anxiety disorders: Secondary | ICD-10-CM | POA: Diagnosis not present

## 2021-07-11 DIAGNOSIS — T50902D Poisoning by unspecified drugs, medicaments and biological substances, intentional self-harm, subsequent encounter: Secondary | ICD-10-CM | POA: Diagnosis not present

## 2021-07-11 DIAGNOSIS — Z3A31 31 weeks gestation of pregnancy: Secondary | ICD-10-CM

## 2021-07-11 MED ORDER — QUETIAPINE FUMARATE 25 MG PO TABS
25.0000 mg | ORAL_TABLET | Freq: Every day | ORAL | 3 refills | Status: DC
  Filled 2021-07-11: qty 30, 30d supply, fill #0

## 2021-07-11 MED ORDER — SERTRALINE HCL 25 MG PO TABS
25.0000 mg | ORAL_TABLET | Freq: Every day | ORAL | 3 refills | Status: DC
  Filled 2021-07-11: qty 30, 30d supply, fill #0

## 2021-07-11 NOTE — Discharge Summary (Signed)
Physician Discharge Summary  [name] MRN: [id number] DOB/AGE: 1996-01-01 25 y.o.  Admit date: 07/08/2021 Discharge date: 07/11/2021  Admission Diagnoses: suicidal attempt in third trimester of pregnancy  Discharge Diagnoses:  Principal Problem:   Suicide attempt by drug ingestion [facility]) Active Problems:   Morbid obesity (HCC)   Supervision of high-risk pregnancy   Depression with anxiety   Previous known suicide attempt as a teenager   Hypokalemia   [redacted] weeks gestation of pregnancy   Discharged Condition: fair  Hospital Course: patient admitted with suicidal attempt by drug ingestion in the third trimester. Patient presented without any obstetrical concerns. Fetal status remained reassuring throughout her admission. She was placed under IVC until inpatient placement. Patient was started on zoloft and seroquel with good tolerance. Patient was found to be stable for discharge as per conversations with psychiatry with plans to follow up outpatient on Wednesday. Her children will be placed under the temporary custody of her grandmother. Patient is stable for discharge with plans to follow up for ROB on 12/28  Consults: psychiatry   Discharge Exam: Blood pressure (!) 135/59, pulse 76, temperature 98.3 F (36.8 C), temperature source Oral, resp. rate 17, height 5\' 6"  (1.676 m), weight 128.4 kg, last menstrual period 12/04/2020, SpO2 100 %, not currently breastfeeding. See progress note  Disposition: home   Allergies as of 07/11/2021       Reactions   Carrot [daucus [place]] Itching   Kiwi Extract Itching        Medication List     TAKE these medications    acetaminophen 325 MG tablet Commonly known as: Tylenol Take 2 tablets (650 mg total) by mouth every 6 (six) hours as needed for mild pain, moderate pain, fever or headache (for pain scale < 4).   aspirin 81 MG EC tablet Take 1 tablet (81 mg total) by mouth daily. Swallow whole.   coconut oil  Oil Apply 1 application topically as needed (nipple pain).   Doxylamine-Pyridoxine 10-10 MG Tbec Commonly known as: Diclegis 2 tabs q hs, if sx persist add 1 tab q am on day 3, if sx persist add 1 tab q afternoon on day 4   ferrous sulfate 325 (65 FE) MG tablet Take 1 tablet (325 mg total) by mouth every other day.   PRENATAL GUMMIES PO Take 0.5 tablets by mouth 2 (two) times daily.   QUEtiapine 25 MG tablet Commonly known as: SEROQUEL Take 1 tablet (25 mg total) by mouth at bedtime.   sertraline 25 MG tablet Commonly known as: ZOLOFT Take 1 tablet (25 mg total) by mouth at bedtime.        Follow-up Information     [facility] Follow up on 07/20/2021.   Why: as scheduled at 11 am Contact information: [address] [place] [phone]                Signed: [phone] 07/11/2021, 4:30 PM

## 2021-07-11 NOTE — Progress Notes (Signed)
Spoke with Arna Snipe, MD. Pt is cleared from IVC.

## 2021-07-11 NOTE — Progress Notes (Signed)
Discharge instructions and prescriptions given to pt. Discussed signs and symptoms to report to the MD, upcoming appointments, and meds. Pt verbalizes understanding and has no questions or concerns at this time. Pt discharged home from hospital in stable condition. 

## 2021-07-11 NOTE — Consult Note (Signed)
Reason for Consult: Suicide Attempt with OD on Iron pills, [redacted] weeks pregnant Referring Physician: Tereso Newcomer, MD     Assessment/Plan: Leah Kennedy is a 25 y.o. female admitted medically for 07/08/2021  7:36 AM for Overdose on Iron.  She carries the psychiatric diagnoses of bipolar, manic depression, PTSD, ADHD, mood disorder and has no significant past medical history.  Psychiatry was consulted for Suicide Attempt via Overdose.   She meets criteria for MDD based on the symptoms of- low mood, anhedonia, low energy/fatigue, insomnia, and suicide attempt and the time span that it has occurred over and for Chronic PTSD given her history and hypervigilance.  Outpatient psychotropic medications include none since age 30 and historically reports she did not have a good response to these medications.  She was not on medications prior to admission.  On initial examination, patient was laying in bed with flat affect but did become slightly more animated through it.   Patient has good insight into her situation as she now regrets her overdose and regrets not staying on medication after turning 20 and regrets not asking for help sooner.  She also reports having the conversation with her grandmother who will take temporary custody of her children so that she can focus on bettering herself.  She reports that she was not attempting to leave yesterday but merely trying to ask questions about the process and that this was misinterpreted.  These things along with improvement from her medication show that patient is forward thinking and committed to caring for herself.  We will therefore change our recommendation to a PHP/IOP program so that patient can continue with her care.  Social worker will meet with the patient to discuss options available to her in [employer] and with her insurance.  We will not recommend any medication changes at this time.   Recommendations: -PHP/IOP program -Continue Zoloft 25  mg QHS -Continue Seroquel 25 mg QHS   Continue rest of care per Primary Team These recommendations have been discussed with the Primary Team Psychiatry will continue to follow the patient   Leah Kennedy is an 25 y.o. female.  HPI: Leah Kennedy is a 25 y.o. [id number] at [redacted]w[redacted]d admitted for observation after suicide attempt by drug ingestion.  History remarkable for previous suicide attempt in her teenage years, also history of active depression and anxiety. She presented to [facility] ED this morning and she reported taking seven iron pills (ferrous sulfate 325 mg) around 0500 this morning in an attempt to overdose. She denies taking anything else. She reported wanting to die, but wants her baby to be okay.   She reported nausea, no emesis, no abdominal pain. Endorsed good fetal movement, no vaginal bleeding, leaking of fluid or contractions.  At [facility] ER, her vitals were stable and she had an unremarkable exam. Category 1 FHT tracing was noted, no contractions.  Labs were notable for increased iron level of 224, K 3.0, CO2 21, hemoglobin 10.3, WBC 10.5. Normal EKG.   Poison control was called, they recommended close observation and rechecking of labs, her iron level of 224 did not meet threshold of 350 needed for deferoxamine administration. They did want the iron level lover and the CO2 to remain around 20 or less before she is medically cleared.  Psychiatry was consulted, they said they will evaluate when patient is medically cleared. The decision was made to transfer patient to [facility] for further observation and further evaluation.   Patient started off the interview  today by stating that there is a misunderstanding yesterday and she wanted to know why she was IVC.  She states that she was not attempting to leave nor wanting to leave but just wanted to know about the process.  She states that she agreed to willingly stay for treatment and go to inpatient treatment and this has not changed.   Discussed with her that the IVC had been taken out because there was concern that she might attempt to leave and given her suicide attempt there was an over abundance of caution to ensure her and her unborn child safety.  She said she understood that but that this was a misunderstanding.  She is expressed regret about her overdose and states that she wished she had stayed on medication and not stopped when she turned 20.  She wonders if she would have even ended up in the situation if she had been getting help this entire time.  She states that she wished she had reached out for help sooner.  She states she regrets taking her overdose.  She states that she has had a long conversation with her grandmother and she is agreed to take temporary custody of the patient's children so that she can focus on getting herself better so that she can take care of her children.  She reports that she plans to tell her children about her issues with mental health when they are older so that they can know the importance of reaching out for help when needed.  She states that she is finally getting sleep and thinks that the medications have been very helpful for her.  Discussed with her that given her improvement on the medication and with her forward thinking and planning she is already done that we think a PHP/IOP program would be beneficial and appropriate for her.  She stated that she would be interested in this and wanted to see if she would qualify for them.  Discussed that we would have social worker come and speak with her about the programs that would be available to her in her area and with her insurance.  She then stated she never knew that therapy would be covered by her insurance and again wishes that she use these resources sooner rather than get to this point.    She reports that her sleep has significantly improved.  She reports that her appetite is continuing to improve.  She reports no SI, HI, or AVH.  She reports no  other concerns at present.   Past Medical History:  Diagnosis Date   Chlamydia 07/15/2018   tx 07/15/18 POC -/-      +11/19/19  tx 11/24/19, POC 04/21/20: +, tx 04/27/20  POC + 06/07/20, POC neg 09/10/20     +03/29/21  POC 06/20/21  -/-   Depression    Depression with anxiety 03/01/2021   No meds, declines [facility] referral   Marijuana use 04/27/2020   Counseled via MyChart 04/27/2020    Counseled via MyChart 03/07/2021     Repeat___    Morbid obesity (HCC)    Prediabetes    Previous known suicide attempt as a teenage 07/08/2021    Past Surgical History:  Procedure Laterality Date   TONSILLECTOMY  2017   TOOTH EXTRACTION Right 01/2021   WISDOM TOOTH EXTRACTION Bilateral 2020    Family History  Problem Relation Age of Onset   Stroke Father    Alcohol abuse Father    Pulmonary disease Maternal Grandmother  Drug abuse Maternal Grandmother    Alcohol abuse Maternal Grandfather    Heart murmur Paternal Grandmother    Hypertension Paternal Grandmother    Irritable bowel syndrome Paternal Grandmother    Glaucoma Paternal Grandmother    Hypertension Paternal Grandfather    Diabetes Paternal Grandfather    Gout Paternal Grandfather    Kidney disease Paternal Grandfather    Glaucoma Paternal Grandfather     Social History:  reports that she has quit smoking. She has never used smokeless tobacco. She reports that she does not currently use alcohol. She reports that she does not currently use drugs after having used the following drugs: Marijuana.  Allergies:  Allergies  Allergen Reactions   Carrot [Daucus Carota] Itching   Kiwi Extract Itching    Medications: I have reviewed the patient's current medications. Prior to Admission:  Medications Prior to Admission  Medication Sig Dispense Refill Last Dose   acetaminophen (TYLENOL) 325 MG tablet Take 2 tablets (650 mg total) by mouth every 6 (six) hours as needed for mild pain, moderate pain, fever or headache (for pain scale < 4).       aspirin 81 MG EC tablet Take 1 tablet (81 mg total) by mouth daily. Swallow whole. 90 tablet 3    coconut oil OIL Apply 1 application topically as needed (nipple pain). (Patient not taking: Reported on 10/22/2020)  0    Doxylamine-Pyridoxine (DICLEGIS) 10-10 MG TBEC 2 tabs q hs, if sx persist add 1 tab q am on day 3, if sx persist add 1 tab q afternoon on day 4 (Patient not taking: Reported on 04/30/2021) 100 tablet 6    ferrous sulfate 325 (65 FE) MG tablet Take 1 tablet (325 mg total) by mouth every other day. 45 tablet 2    prenatal vitamin w/FE, FA (PRENATAL 1 + 1) 27-1 MG TABS tablet Take 1 tablet by mouth daily at 12 noon. 30 tablet 12    Scheduled:  aspirin EC  81 mg Oral Daily   docusate sodium  100 mg Oral Daily   prenatal vitamin w/FE, FA  1 tablet Oral Q1200   QUEtiapine  25 mg Oral QHS   sertraline  25 mg Oral QHS   Continuous: XLK:[id number], calcium carbonate, ondansetron Anti-infectives (From admission, onward)    None       Results for orders placed or performed during the hospital encounter of 07/08/21 (from the past 48 hour(s))  Type and screen [facility]     Status: None   Collection Time: 07/10/21  8:27 AM  Result Value Ref Range   ABO/RH(D) AB POS    Antibody Screen NEG    Sample Expiration      07/13/2021,2359 Performed at [facility], [address] [place], [place] [address]     No results found.   Blood pressure (!) 135/59, pulse 76, temperature 98.3 F (36.8 C), temperature source Oral, resp. rate 17, height 5\' 6"  (1.676 m), weight 128.4 kg, last menstrual period 12/04/2020, SpO2 100 %, not currently breastfeeding. Psychiatric Specialty Exam: Physical Exam Vitals and nursing note reviewed.  Constitutional:      General: She is not in acute distress.    Appearance: Normal appearance. She is obese. She is not ill-appearing or toxic-appearing.  HENT:     Head: Normocephalic and atraumatic.  Pulmonary:     Effort:  Pulmonary effort is normal.  Musculoskeletal:        General: Normal range of motion.  Neurological:     General: No focal deficit present.     Mental Status: She is alert.    Review of Systems  Respiratory:  Negative for cough and shortness of breath.   Cardiovascular:  Negative for chest pain.  Gastrointestinal:  Negative for abdominal pain, constipation, diarrhea, nausea and vomiting.  Neurological:  Negative for weakness and headaches.  Psychiatric/Behavioral:  Negative for agitation, hallucinations, sleep disturbance and suicidal ideas. The patient is not nervous/anxious.    Blood pressure (!) 135/59, pulse 76, temperature 98.3 F (36.8 C), temperature source Oral, resp. rate 17, height 5\' 6"  (1.676 m), weight 128.4 kg, last menstrual period 12/04/2020, SpO2 100 %, not currently breastfeeding.Body mass index is 45.68 kg/m.  General Appearance: Casual and Fairly Groomed  Eye Contact:  Good  Speech:  Clear and Coherent and Normal Rate  Volume:  Normal  Mood:  Euthymic  Affect:  Appropriate and Congruent  Thought Process:  Coherent and Goal Directed  Orientation:  Full (Time, Place, and Person)  Thought Content:  WDL and Logical  Suicidal Thoughts:  No  Homicidal Thoughts:  No  Memory:  Immediate;   Fair Recent;   Fair  Judgement:  Fair  Insight:  Fair  Psychomotor Activity:  Normal  Concentration:  Concentration: Good and Attention Span: Good  Recall:  Good  Fund of Knowledge:  Good  Language:  Good  Akathisia:  Negative  Handed:  Right  AIMS (if indicated):     Assets:  Communication Skills Desire for Improvement Physical Health Resilience Social Support  ADL's:  Intact  Cognition:  WNL  Sleep:   good      12/06/2020 07/11/2021, 12:05 PM

## 2021-07-11 NOTE — Progress Notes (Signed)
CSW acknowledges consult and completed clinical assessment.  Full Clinical documentation will follow at a later time.    CSW spoke with MGM Cariah Salatino [phone]) via telephone after receiving verbal consent from patient.  MGM agreed to provide support to patient and continue with temporary custody of patient's children.  MGM reported seeking legal advise to obtain legal temporary custody. However, until legal custody is obtain, MGM agreed to have patient's children remain in her care.   CSW scheduled patient a follow-up mental health appointment at [facility] for 07/13/21 at 12 noon.    Bedside RN was updated.   There are no barriers to d/c.  Blaine Hamper, MSW, LCSW Clinical Social Work [phone]

## 2021-07-11 NOTE — Progress Notes (Addendum)
[name], female   DOB: 1996/02/15, 25 y.o.   MRN: [id number] FACULTY PRACTICE ANTEPARTUM COMPREHENSIVE PROGRESS NOTE  Leah Kennedy is a 25 y.o. [id number] at [redacted]w[redacted]d who is admitted for observation after suicide attempt by drug ingestion.  Estimated Date of Delivery: 09/10/21  Length of Stay:  3 Days. Admitted 07/08/2021   ASSESSMENT: Principal Problem:   Suicide attempt by drug ingestion [facility]) Active Problems:   Morbid obesity (HCC)   Supervision of high-risk pregnancy   Depression with anxiety   Previous known suicide attempt as a teenager   Hypokalemia   [redacted] weeks gestation of pregnancy  PLAN: *Psych: Appreciate Psychiatry recommendations, currently under IVC. CSW will help in trying to arrange for inpatient psychiatric admission given recent suicide attempt.  -Continue Seroquel and Zoloft as recommended. -Will continue suicide precautions, 1:1 sitter services. *Pregnancy:No acute OB concerns; patient with reactive non stress test. Continue daily NST. Continue low dose aspirin as pre-eclampsia prophylaxis.  *PPx: lovenox ordered ordered *Dispo: pending placement at inpatient psych facility. SW continuing to work on this   SUBJECTIVE: Patient reports good fetal movement.  She reports no uterine contractions, no bleeding and no loss of fluid per vagina. Patient is requesting to be discharged home   OBJECTIVE: Vitals:  Blood pressure 120/64, pulse 76, temperature 98.3 F (36.8 C), temperature source Oral, resp. rate 18, height 5\' 6"  (1.676 m), weight 128.4 kg, last menstrual period 12/04/2020, SpO2 100 %, not currently breastfeeding. Physical Examination: GENERAL: Well-developed, well-nourished female in no acute distress.  LUNGS: Clear to auscultation bilaterally.  HEART: Regular rate and rhythm. ABDOMEN: Soft, nontender, nondistended. No organomegaly. PELVIC: Not indicated EXTREMITIES: No cyanosis, clubbing, or edema, 2+ distal pulses.   Fetal monitoring:  FHR: 125 bpm, Variability: moderate, Accelerations: Present, Decelerations: Absent  Uterine activity: no contractions   Labs: no new labs Radiology: no new imaging  Current scheduled medications  aspirin EC  81 mg Oral Daily   docusate sodium  100 mg Oral Daily   prenatal vitamin w/FE, FA  1 tablet Oral Q1200   QUEtiapine  25 mg Oral QHS   sertraline  25 mg Oral QHS   I have reviewed the patient's current medications.   12/06/2020 MD Attending Center for [facility] (Faculty Practice) GYN Consult Phone: [phone] (M-F, [phone]) & [phone] (Off hours, weekends, holidays)

## 2021-07-12 ENCOUNTER — Telehealth: Payer: Self-pay

## 2021-07-12 NOTE — Clinical SW OB High Risk (Signed)
OB Specialty Care  Clinical Social Worker:  Dimple Nanas, [place] Date/Time:  07/12/2021, 9:24 AM Gestational Age on Admission:  25 y.o. Admitting Diagnosis:  07/08/2021   Expected Delivery Date:  07/12/21  Family/Home Environment  Home Address:  [place] Member/Support Name:  Aleighya Mcanelly mother     Relationship:   (Assessment completed with patient.) Other Support:  Friend [employer]   Psychosocial Data  Employment:  Unemployed  Type of Work:    Education:     Nurse, mental health Care:  None reported   Strengths/Weaknesses/Factors to Consider  Concerns Related to Hospitalization:  Childcare/ however pt mother verbally consented to provide temporary custody to patients children.    Previous Pregnancies/Feelings Towards Pregnancy?  Concerns related to being/becoming a mother?:  Pt reported initially feeling overwhelmed however is not starting to feel bonded with baby.   Social Support (FOB? Who is/will be helping with baby/other kids?): Extended Family, Immediate Family, Parent    Recent Stressful Life Events (life changes in past year?):  Overwhelmed with pregnancy and current relationship with FOB.    Prenatal Care/Education/Home Preparations: Pt attends regular OB visits and reports having all essential items to care for infant post delivery   Domestic Violence (of any type):  No If Yes to Domestic Violence, Describe/Action Plan:  N/A   Substance Use During Pregnancy: No   Clinical Assessment/Plan: CSW met with IVC'd patient in room 110.  When CSW arrived, pt was resting in bed, pt friend [facility]) was resting on the couch, and sitter was there observing.  Everyone appeared happy and comfortable.  CSW explained CSW's role and invited pt to ask questions.  Pt gave verbal consent for CSW to complete assessment while pt friend remained in the room.  Pt's friend did not engage with CSW. CSW assessed for safety and pt  denied SI, HI, and DV.  Pt reported feeling tired and communicated that her mood has improved significantly.  CSW offered resources for outpatient counseling and pt was receptive.  CSW agreed to contact [facility] to schedule pt a follow-up appointment (appt scheduled for 07/13/21 at 12 noon). Pt reported having a good support team that consist of her friend and mother.  Per pt, pt's mother has agreed to obtain temporary custody of pt's children until pt's mood is moore stable.  Pt called her mother Kissy Cielo) via telephone and pt's mother confirmed that plan that pt described to CSW. Pt denied having any questions or needing any additional resources. CSW updated bedside RN.   There are no barriers to discharge.   Laurey Arrow, MSW, LCSW Clinical Social Work [phone]

## 2021-07-12 NOTE — Telephone Encounter (Signed)
Transition Care Management Follow-up Telephone Call Date of discharge and from where: 07/11/2021 from [facility] How have you been since you were released from the hospital? Pt stated that she is resting but feeling well and did not have any questions or concerns at this time.  Any questions or concerns? No  Items Reviewed: Did the pt receive and understand the discharge instructions provided? Yes  Medications obtained and verified? Yes  Other? No  Any new allergies since your discharge? No  Dietary orders reviewed? No Do you have support at home? Yes   Functional Questionnaire: (I = Independent and D = Dependent) ADLs: I  Bathing/Dressing- I  Meal Prep- I  Eating- I  Maintaining continence- I  Transferring/Ambulation- I  Managing Meds- I   Follow up appointments reviewed:  PCP Hospital f/u appt confirmed? No   Specialist Hospital f/u appt confirmed? Yes  Scheduled to see Cam Hai, CNM on 07/20/2021 @ 11:10am. Are transportation arrangements needed? No  If their condition worsens, is the pt aware to call PCP or go to the Emergency Dept.? Yes Was the patient provided with contact information for the PCP's office or ED? Yes Was to pt encouraged to call back with questions or concerns? Yes

## 2021-07-15 ENCOUNTER — Encounter: Payer: Medicaid Other | Admitting: Women's Health

## 2021-07-20 ENCOUNTER — Encounter: Payer: Medicaid Other | Admitting: Advanced Practice Midwife

## 2021-07-24 NOTE — L&D Delivery Note (Addendum)
Delivery Note At 5:04 AM a viable and healthy female was delivered via Vaginal, Spontaneous (Presentation:   Occiput Anterior).  APGAR: 8, 9; weight pending.  Cord clamped x2 after 1 minute delay and cut by grandmother under my supervision. Placenta delivered via CCT. Placenta status: Spontaneous, Intact.  Cord: 3 vessels with the following complications:  none.  Pitocin given after delivery of placenta. Post placental Liletta placed. TXA given.  Anesthesia: Epidural Episiotomy: None Lacerations: left periurethral abrasion not needing repair Est. Blood Loss (mL): 400  Mom to postpartum.  Baby to Couplet care / Skin to Skin.  Erick Alley 09/03/2021, 5:32 AM

## 2021-07-25 ENCOUNTER — Encounter (HOSPITAL_COMMUNITY): Payer: Self-pay | Admitting: Obstetrics & Gynecology

## 2021-07-25 ENCOUNTER — Other Ambulatory Visit: Payer: Self-pay

## 2021-07-25 ENCOUNTER — Inpatient Hospital Stay (HOSPITAL_COMMUNITY)
Admission: AD | Admit: 2021-07-25 | Discharge: 2021-07-25 | Disposition: A | Discharge status: Home / Self Care | Payer: Medicaid Other | Attending: Obstetrics & Gynecology | Admitting: Obstetrics & Gynecology

## 2021-07-25 DIAGNOSIS — O9A213 Injury, poisoning and certain other consequences of external causes complicating pregnancy, third trimester: Secondary | ICD-10-CM | POA: Insufficient documentation

## 2021-07-25 DIAGNOSIS — F32A Depression, unspecified: Secondary | ICD-10-CM | POA: Insufficient documentation

## 2021-07-25 DIAGNOSIS — Z3A33 33 weeks gestation of pregnancy: Secondary | ICD-10-CM | POA: Insufficient documentation

## 2021-07-25 DIAGNOSIS — S334XXA Traumatic rupture of symphysis pubis, initial encounter: Secondary | ICD-10-CM | POA: Diagnosis not present

## 2021-07-25 DIAGNOSIS — Z3A31 31 weeks gestation of pregnancy: Secondary | ICD-10-CM | POA: Diagnosis not present

## 2021-07-25 DIAGNOSIS — F419 Anxiety disorder, unspecified: Secondary | ICD-10-CM | POA: Diagnosis not present

## 2021-07-25 DIAGNOSIS — T50902D Poisoning by unspecified drugs, medicaments and biological substances, intentional self-harm, subsequent encounter: Secondary | ICD-10-CM | POA: Diagnosis not present

## 2021-07-25 DIAGNOSIS — M545 Low back pain, unspecified: Secondary | ICD-10-CM | POA: Diagnosis not present

## 2021-07-25 DIAGNOSIS — Z9151 Personal history of suicidal behavior: Secondary | ICD-10-CM | POA: Insufficient documentation

## 2021-07-25 DIAGNOSIS — R102 Pelvic and perineal pain: Secondary | ICD-10-CM | POA: Diagnosis not present

## 2021-07-25 DIAGNOSIS — O26893 Other specified pregnancy related conditions, third trimester: Secondary | ICD-10-CM | POA: Insufficient documentation

## 2021-07-25 DIAGNOSIS — Z79899 Other long term (current) drug therapy: Secondary | ICD-10-CM | POA: Insufficient documentation

## 2021-07-25 DIAGNOSIS — O99343 Other mental disorders complicating pregnancy, third trimester: Secondary | ICD-10-CM | POA: Insufficient documentation

## 2021-07-25 DIAGNOSIS — Z3689 Encounter for other specified antenatal screening: Secondary | ICD-10-CM | POA: Insufficient documentation

## 2021-07-25 DIAGNOSIS — F418 Other specified anxiety disorders: Secondary | ICD-10-CM

## 2021-07-25 LAB — URINALYSIS, ROUTINE W REFLEX MICROSCOPIC
Bilirubin Urine: NEGATIVE
Glucose, UA: NEGATIVE mg/dL
Hgb urine dipstick: NEGATIVE
Ketones, ur: 5 mg/dL — AB
Nitrite: NEGATIVE
Protein, ur: 30 mg/dL — AB
Specific Gravity, Urine: 1.028 (ref 1.005–1.030)
pH: 6 (ref 5.0–8.0)

## 2021-07-25 LAB — WET PREP, GENITAL
Clue Cells Wet Prep HPF POC: NONE SEEN
Sperm: NONE SEEN
Trich, Wet Prep: NONE SEEN
WBC, Wet Prep HPF POC: NONE SEEN — AB (ref <10)
Yeast Wet Prep HPF POC: NONE SEEN

## 2021-07-25 MED ORDER — ACETAMINOPHEN 500 MG PO TABS
1000.0000 mg | ORAL_TABLET | Freq: Once | ORAL | Status: AC
  Administered 2021-07-25: 1000 mg via ORAL
  Filled 2021-07-25: qty 2

## 2021-07-25 MED ORDER — CYCLOBENZAPRINE HCL 10 MG PO TABS: 10.0000 mg | ORAL_TABLET | Freq: Three times a day (TID) | ORAL | 1 refills | Status: DC | PRN

## 2021-07-25 NOTE — MAU Note (Signed)
Leah Kennedy is a 26 y.o. at [redacted]w[redacted]d here in MAU reporting: thinks she lost her mucus plug. Started having abdominal pain yesterday and it has been getting worse. Denies bleeding or LOF. +FM  Onset of complaint: ongoing  Pain score: 6/10  Vitals:   07/25/21 1439  BP: 120/73  Pulse: 100  Resp: 18  Temp: 98.1 F (36.7 C)  SpO2: 98%     FHT:157  Lab orders placed from triage: UA

## 2021-07-25 NOTE — MAU Provider Note (Signed)
Chief Complaint:  Abdominal Pain   Event Date/Time   First Provider Initiated Contact with Patient 07/25/21 1533     HPI: Leah Kennedy is a 26 y.o. [id number] at [redacted]w[redacted]d who presents to maternity admissions reporting severe lower back and pelvic pain - the pelvic pain has been getting progressively worse, aggravated by movement especially lifting her legs or turning over in bed. Notes popping and clicking when walking and worsening pain. Lower back pain got much worse yesterday. Has not taken anything for the pain. Denies vaginal bleeding, leaking of fluid, decreased fetal movement, fever, falls, or recent illness. Recent SA and was seen outpatient for the attempt, now on Zoloft and Seroquel, feeling better overall.  Pregnancy Course: Receives care from [facility], prenatal records reviewed.   Past Medical History:  Diagnosis Date   Chlamydia 07/15/2018   tx 07/15/18 POC -/-      +11/19/19  tx 11/24/19, POC 04/21/20: +, tx 04/27/20  POC + 06/07/20, POC neg 09/10/20     +03/29/21  POC 06/20/21  -/-   Depression    Depression with anxiety 03/01/2021   No meds, declines IBH referral   Marijuana use 04/27/2020   Counseled via [place] 04/27/2020    Counseled via [place] 03/07/2021     Repeat___    Morbid obesity ([place])    Prediabetes    Previous known suicide attempt as a teenage 07/08/2021   OB History  Gravida Para Term Preterm AB Living  4 2 2   1 2   SAB IAB Ectopic Multiple Live Births  1     0 2    # Outcome Date GA Lbr Len/2nd Weight Sex Delivery Anes PTL Lv  4 Current           3 Term 10/18/20 [redacted]w[redacted]d / 00:11 6 lb 9.1 oz (2.98 kg) M Vag-Spont EPI  LIV  2 Term 01/17/19 [redacted]w[redacted]d 10:35 / 00:18 7 lb 5.8 oz (3.34 kg) M Vag-Spont EPI N LIV  1 SAB 05/2017           Past Surgical History:  Procedure Laterality Date   TONSILLECTOMY  2017   TOOTH EXTRACTION Right 01/2021   WISDOM TOOTH EXTRACTION Bilateral 2020   Family History  Problem Relation Age of Onset   Stroke Father    Alcohol abuse  Father    Pulmonary disease Maternal Grandmother    Drug abuse Maternal Grandmother    Alcohol abuse Maternal Grandfather    Heart murmur Paternal Grandmother    Hypertension Paternal Grandmother    Irritable bowel syndrome Paternal Grandmother    Glaucoma Paternal Grandmother    Hypertension Paternal Grandfather    Diabetes Paternal Grandfather    Gout Paternal Grandfather    Kidney disease Paternal Grandfather    Glaucoma Paternal Grandfather    Social History   Tobacco Use   Smoking status: Former   Smokeless tobacco: Never  Scientific laboratory technician Use: Never used  Substance Use Topics   Alcohol use: Not Currently   Drug use: Not Currently    Types: Marijuana    Comment: before pregnancy   Allergies  Allergen Reactions   Carrot [Daucus Carota] Itching   Kiwi Extract Itching   No medications prior to admission.   I have reviewed patient's Past Medical Hx, Surgical Hx, Family Hx, Social Hx, medications and allergies.   ROS:  Pertinent items noted in HPI and remainder of comprehensive ROS otherwise negative.   Physical Exam  Patient Vitals for the  past 24 hrs:  BP Temp Temp src Pulse Resp SpO2 Height Weight  07/25/21 1453 113/64 -- -- (!) 101 -- -- -- --  07/25/21 1439 120/73 98.1 F (36.7 C) Oral 100 18 98 % -- --  07/25/21 1434 -- -- -- -- -- -- 5\' 6"  (1.676 m) 284 lb 9.6 oz (129.1 kg)   Constitutional: Well-developed, well-nourished female in no acute distress.  Cardiovascular: normal rate & rhythm Respiratory: normal effort GI: Abd soft, non-tender, gravid appropriate for gestational age. Pos BS x 4 MS: Extremities nontender, no edema, normal ROM, some suprapubic tenderness and difficulty changing positions due to pain. Neurologic: Alert and oriented x 4.  GU: no CVA tenderness Pelvic: exam deferred  Fetal Tracing: reactive Baseline: 125 Variability: moderate Accelerations: 15x15 Decelerations: none Toco: relaxed   Labs: Results for orders placed or  performed during the hospital encounter of 07/25/21 (from the past 24 hour(s))  Urinalysis, Routine w reflex microscopic Urine, Clean Catch     Status: Abnormal   Collection Time: 07/25/21  3:21 PM  Result Value Ref Range   Color, Urine AMBER (A) YELLOW   APPearance HAZY (A) CLEAR   Specific Gravity, Urine 1.028 1.005 - 1.030   pH 6.0 5.0 - 8.0   Glucose, UA NEGATIVE NEGATIVE mg/dL   Hgb urine dipstick NEGATIVE NEGATIVE   Bilirubin Urine NEGATIVE NEGATIVE   Ketones, ur 5 (A) NEGATIVE mg/dL   Protein, ur 30 (A) NEGATIVE mg/dL   Nitrite NEGATIVE NEGATIVE   Leukocytes,Ua SMALL (A) NEGATIVE   RBC / HPF 0-5 0 - 5 RBC/hpf   WBC, UA 0-5 0 - 5 WBC/hpf   Bacteria, UA RARE (A) NONE SEEN   Squamous Epithelial / LPF 6-10 0 - 5   Mucus PRESENT    Ca Oxalate Crys, UA PRESENT   Wet prep, genital     Status: Abnormal   Collection Time: 07/25/21  3:32 PM   Specimen: Vaginal  Result Value Ref Range   Yeast Wet Prep HPF POC NONE SEEN NONE SEEN   Trich, Wet Prep NONE SEEN NONE SEEN   Clue Cells Wet Prep HPF POC NONE SEEN NONE SEEN   WBC, Wet Prep HPF POC NONE SEEN (A) <10   Sperm NONE SEEN    Imaging:  No results found.  MAU Course: Orders Placed This Encounter  Procedures   Wet prep, genital   Urinalysis, Routine w reflex microscopic Urine, Clean Catch   Ambulatory referral to Integrated Behavioral Health   Ambulatory referral to Physical Therapy   Discharge patient   Meds ordered this encounter  Medications   acetaminophen (TYLENOL) tablet 1,000 mg   cyclobenzaprine (FLEXERIL) 10 MG tablet    Sig: Take 1 tablet (10 mg total) by mouth every 8 (eight) hours as needed for muscle spasms.    Dispense:  30 tablet    Refill:  1    Order Specific Question:   Supervising Provider    Answer:   Samara Snide   MDM: Symptoms consistent with symphysis pubis disruption. Tylenol and a heat pack given for pain with some relief of symptoms. 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  Discussed events that led up to recent SA and encouraged pt to see a therapist in addition to her new meds. Accepted referral to [facility], message sent to Hermelinda Dellen, SW.  Assessment: 1. Symphysis pubis disruption, initial encounter   2. NST (non-stress test) reactive   3. [redacted] weeks gestation of pregnancy  4. Depression with anxiety   5. Suicide attempt by drug ingestion, subsequent encounter    Plan: Discharge home in stable condition with return precautions     Follow-up Information     [facility] Follow up.   Specialty: Obstetrics and Gynecology Why: as scheduled for ongoing prenatal care Contact information: [address] [place] [address] [phone]                Allergies as of 07/25/2021       Reactions   Carrot [daucus Carota] Itching   Kiwi Extract Itching        Medication List     STOP taking these medications    coconut oil Oil   Doxylamine-Pyridoxine 10-10 MG Tbec Commonly known as: Diclegis       TAKE these medications    acetaminophen 325 MG tablet Commonly known as: Tylenol Take 2 tablets (650 mg total) by mouth every 6 (six) hours as needed for mild pain, moderate pain, fever or headache (for pain scale < 4).   aspirin 81 MG EC tablet Take 1 tablet (81 mg total) by mouth daily. Swallow whole.   cyclobenzaprine 10 MG tablet Commonly known as: FLEXERIL Take 1 tablet (10 mg total) by mouth every 8 (eight) hours as needed for muscle spasms.   ferrous sulfate 325 (65 FE) MG tablet Take 1 tablet (325 mg total) by mouth every other day.   PRENATAL GUMMIES PO Take 0.5 tablets by mouth 2 (two) times daily.   QUEtiapine 25 MG tablet Commonly known as: SEROQUEL Take 1 tablet (25 mg total) by mouth at bedtime.   sertraline 25 MG tablet Commonly known as: ZOLOFT Take 1 tablet (25 mg total) by mouth at bedtime.       Gaylan Gerold, CNM, MSN, [place] Certified Nurse Midwife, [facility]

## 2021-07-26 LAB — GC/CHLAMYDIA PROBE AMP (~~LOC~~) NOT AT ARMC
Chlamydia: NEGATIVE
Comment: NEGATIVE
Comment: NORMAL
Neisseria Gonorrhea: NEGATIVE

## 2021-08-08 ENCOUNTER — Telehealth: Payer: Self-pay

## 2021-08-08 ENCOUNTER — Ambulatory Visit: Payer: Medicaid Other | Attending: Certified Nurse Midwife

## 2021-08-08 NOTE — Telephone Encounter (Signed)
PT called and attempted to leave message but was unable.

## 2021-08-09 ENCOUNTER — Telehealth: Payer: Self-pay | Admitting: Licensed Clinical Social Worker

## 2021-08-09 ENCOUNTER — Other Ambulatory Visit: Payer: Self-pay

## 2021-08-09 ENCOUNTER — Encounter: Payer: Medicaid Other | Admitting: Licensed Clinical Social Worker

## 2021-08-09 NOTE — Telephone Encounter (Signed)
Called pt regarding scheduled mychart visit. Left message for callback  

## 2021-08-09 NOTE — Telephone Encounter (Signed)
Called pt regarding scheduled mychart visit. Phone kept ringing unable to leave message.

## 2021-08-10 ENCOUNTER — Encounter: Payer: Self-pay | Admitting: Women's Health

## 2021-08-10 ENCOUNTER — Other Ambulatory Visit (HOSPITAL_COMMUNITY)
Admission: RE | Admit: 2021-08-10 | Discharge: 2021-08-10 | Disposition: A | Discharge status: Home / Self Care | Payer: Medicaid Other | Source: Ambulatory Visit | Attending: Advanced Practice Midwife | Admitting: Advanced Practice Midwife

## 2021-08-10 ENCOUNTER — Ambulatory Visit (INDEPENDENT_AMBULATORY_CARE_PROVIDER_SITE_OTHER): Payer: Medicaid Other | Admitting: Women's Health

## 2021-08-10 VITALS — BP 120/77 | HR 117 | Wt 292.0 lb

## 2021-08-10 DIAGNOSIS — N898 Other specified noninflammatory disorders of vagina: Secondary | ICD-10-CM

## 2021-08-10 DIAGNOSIS — O26893 Other specified pregnancy related conditions, third trimester: Secondary | ICD-10-CM | POA: Insufficient documentation

## 2021-08-10 DIAGNOSIS — Z3483 Encounter for supervision of other normal pregnancy, third trimester: Secondary | ICD-10-CM

## 2021-08-10 LAB — OB RESULTS CONSOLE GC/CHLAMYDIA: Gonorrhea: NEGATIVE

## 2021-08-10 NOTE — Patient Instructions (Signed)
Caron Presume, thank you for choosing our office today! We appreciate the opportunity to meet your healthcare needs. You may receive a short survey by mail, e-mail, or through [employer]. If you are happy with your care we would appreciate if you could take just a few minutes to complete the survey questions. We read all of your comments and take your feedback very seriously. Thank you again for choosing our office.  Center for [employer] Team at [place] at [facility] ([place], [place] [address]) Entrance C, located off of [address] parking   CLASSES: Go to [email or web address] to register for classes (childbirth, breastfeeding, waterbirth, infant CPR, daddy bootcamp, etc.)  Call the office [phone]) or go to [facility] if: You begin to have strong, frequent contractions Your water breaks.  Sometimes it is a big gush of fluid, sometimes it is just a trickle that keeps getting your panties wet or running down your legs You have vaginal bleeding.  It is normal to have a small amount of spotting if your cervix was checked.  You don't feel your baby moving like normal.  If you don't, get you something to eat and drink and lay down and focus on feeling your baby move.   If your baby is still not moving like normal, you should call the office or go to [facility].  Call the office [phone]) or go to [facility] hospital for these signs of pre-eclampsia: Severe headache that does not go away with Tylenol Visual changes- seeing spots, double, blurred vision Pain under your right breast or upper abdomen that does not go away with Tums or heartburn medicine Nausea and/or vomiting Severe swelling in your hands, feet, and face   Tdap Vaccine It is recommended that you get the Tdap vaccine during the third trimester of EACH pregnancy to help protect your baby from getting pertussis (whooping cough) 27-36 weeks is the BEST time to do  this so that you can pass the protection on to your baby. During pregnancy is better than after pregnancy, but if you are unable to get it during pregnancy it will be offered at the hospital.  You can get this vaccine with [place], at the health department, your family doctor, or some local pharmacies Everyone who will be around your baby should also be up-to-date on their vaccines before the baby comes. Adults (who are not pregnant) only need 1 dose of Tdap during adulthood.   [facility] Pediatricians/Family Doctors [facility]): [address] [place], [facility]: [address] [place], [phone]                [facility]): [place], [phone] (call to ask if accepting patients) [facility] Department: [address], [place], [place] Pediatricians/Family Doctors [facility]): [place]. [place], [address], [facility]: [address] [place], [place] [facility]: [place], [facility]): [phone] [facility]: [address] [place]: [address], [place] [facility]: [phone], [phone]  Home Blood Pressure Monitoring for Patients   Your provider has recommended that you check your  blood pressure (BP) at least once a week at home. If you do not have a blood pressure cuff at home, one will be provided for you. Contact your provider if you have not received your monitor within 1 week.  ° °Helpful Tips for Accurate Home Blood Pressure Checks  °Don't smoke, exercise, or drink caffeine 30 minutes before checking your BP °Use the restroom before checking your BP (a full bladder can raise your  pressure) °Relax in a comfortable upright chair °Feet on the ground °Left arm resting comfortably on a flat surface at the level of your heart °Legs uncrossed °Back supported °Sit quietly and don't talk °Place the cuff on your bare arm °Adjust snuggly, so that only two fingertips can fit between your skin and the top of the cuff °Check 2 readings separated by at least one minute °Keep a log of your BP readings °For a visual, please reference this diagram: http://ccnc.care/bpdiagram ° °Provider Name: [facility]     Phone: [phone] ° °Zone 1: ALL CLEAR  °Continue to monitor your symptoms:  °BP reading is less than 140 (top number) or less than 90 (bottom number)  °No right upper stomach pain °No headaches or seeing spots °No feeling nauseated or throwing up °No swelling in face and hands ° °Zone 2: CAUTION °Call your doctor's office for any of the following:  °BP reading is greater than 140 (top number) or greater than 90 (bottom number)  °Stomach pain under your ribs in the middle or right side °Headaches or seeing spots °Feeling nauseated or throwing up °Swelling in face and hands ° °Zone 3: EMERGENCY  °Seek immediate medical care if you have any of the following:  °BP reading is greater than160 (top number) or greater than 110 (bottom number) °Severe headaches not improving with Tylenol °Serious difficulty catching your breath °Any worsening symptoms from Zone 2  °Preterm Labor and Birth Information ° °The normal length of a pregnancy is 39-41 weeks. Preterm labor is when labor starts before 37 completed weeks of pregnancy. °What are the risk factors for preterm labor? °Preterm labor is more likely to occur in women who: °Have certain infections during pregnancy such as a bladder infection, sexually transmitted infection, or infection inside the uterus (chorioamnionitis). °Have a shorter-than-normal cervix. °Have gone into preterm labor before. °Have had surgery on their cervix. °Are younger than age 17  or older than age 35. °Are African American. °Are pregnant with twins or multiple babies (multiple gestation). °Take street drugs or smoke while pregnant. °Do not gain enough weight while pregnant. °Became pregnant shortly after having been pregnant. °What are the symptoms of preterm labor? °Symptoms of preterm labor include: °Cramps similar to those that can happen during a menstrual period. The cramps may happen with diarrhea. °Pain in the abdomen or lower back. °Regular uterine contractions that may feel like tightening of the abdomen. °A feeling of increased pressure in the pelvis. °Increased watery or bloody mucus discharge from the vagina. °Water breaking (ruptured amniotic sac). °Why is it important to recognize signs of preterm labor? °It is important to recognize signs of preterm labor because babies who are born prematurely may not be fully developed. This can put them at an increased risk for: °Long-term (chronic) heart and lung problems. °Difficulty immediately after birth with regulating body systems, including blood sugar, body temperature, heart rate, and breathing rate. °Bleeding in the brain. °Cerebral palsy. °Learning difficulties. °Death. °These risks are highest for babies who are born before 34 weeks   of pregnancy. How is preterm labor treated? Treatment depends on the length of your pregnancy, your condition, and the health of your baby. It may involve: Having a stitch (suture) placed in your cervix to prevent your cervix from opening too early (cerclage). Taking or being given medicines, such as: Hormone medicines. These may be given early in pregnancy to help support the pregnancy. Medicine to stop contractions. Medicines to help mature the babys lungs. These may be prescribed if the risk of delivery is high. Medicines to prevent your baby from developing cerebral palsy. If the labor happens before 34 weeks of pregnancy, you may need to stay in the hospital. What should I do if I  think I am in preterm labor? If you think that you are going into preterm labor, call your health care provider right away. How can I prevent preterm labor in future pregnancies? To increase your chance of having a full-term pregnancy: Do not use any tobacco products, such as cigarettes, chewing tobacco, and e-cigarettes. If you need help quitting, ask your health care provider. Do not use street drugs or medicines that have not been prescribed to you during your pregnancy. Talk with your health care provider before taking any herbal supplements, even if you have been taking them regularly. Make sure you gain a healthy amount of weight during your pregnancy. Watch for infection. If you think that you might have an infection, get it checked right away. Make sure to tell your health care provider if you have gone into preterm labor before. This information is not intended to replace advice given to you by your health care provider. Make sure you discuss any questions you have with your health care provider. Document Revised: 11/01/2018 Document Reviewed: 12/01/2015 Elsevier Patient Education  [place].

## 2021-08-10 NOTE — Progress Notes (Signed)
Clear, white discharge with no odor.

## 2021-08-10 NOTE — Progress Notes (Signed)
LOW-RISK PREGNANCY VISIT Patient name: Leah Kennedy MRN [id number]  Date of birth: 1995/12/25 Chief Complaint:   High Risk Gestation (Having pelvic pain)  History of Present Illness:   Shann Lewellyn is a 26 y.o. [phone] female at [redacted]w[redacted]d with an Estimated Date of Delivery: 09/10/21 being seen today for ongoing management of a low-risk pregnancy.   Today she reports  symphysis pubis dysfunction. Vaginal d/c, no odor/itching/irritation. . Contractions: Not present. Vag. Bleeding: None.  Movement: Present. denies leaking of fluid.  Depression screen [facility] 2/9 06/20/2021 03/01/2021 04/21/2020 11/19/2019 07/01/2018  Decreased Interest 1 2 0 2 1  Down, Depressed, Hopeless 1 2 0 0 0  PHQ - 2 Score 2 4 0 2 1  Altered sleeping 3 2 2 1 2   Tired, decreased energy 0 2 2 2 1   Change in appetite 0 2 2 2 1   Feeling bad or failure about yourself  0 2 0 1 0  Trouble concentrating 0 2 0 0 0  Moving slowly or fidgety/restless 0 0 0 0 0  Suicidal thoughts 0 0 0 0 0  PHQ-9 Score 5 14 6 8 5   Difficult doing work/chores - - - Not difficult at all -     GAD 7 : Generalized Anxiety Score 06/20/2021 03/01/2021 04/21/2020 11/19/2019  Nervous, Anxious, on Edge 0 2 0 0  Control/stop worrying 0 2 0 0  Worry too much - different things 0 2 0 0  Trouble relaxing 1 2 1 1   Restless 0 2 0 0  Easily annoyed or irritable 0 2 0 0  Afraid - awful might happen 0 2 0 0  Total GAD 7 Score 1 14 1 1   Anxiety Difficulty - - - Not difficult at all      Review of Systems:   Pertinent items are noted in HPI Denies abnormal vaginal discharge w/ itching/odor/irritation, headaches, visual changes, shortness of breath, chest pain, abdominal pain, severe nausea/vomiting, or problems with urination or bowel movements unless otherwise stated above. Pertinent History Reviewed:  Reviewed past medical,surgical, social, obstetrical and family history.  Reviewed problem list, medications and allergies. Physical Assessment:   Vitals:    08/10/21 1340  BP: 120/77  Pulse: (!) 117  Weight: 292 lb (132.5 kg)  Body mass index is 47.13 kg/m.        Physical Examination:   General appearance: Well appearing, and in no distress  Mental status: Alert, oriented to person, place, and time  Skin: Warm & dry  Cardiovascular: Normal heart rate noted  Respiratory: Normal respiratory effort, no distress  Abdomen: Soft, gravid, nontender  Pelvic:  spec exam, CV swab obtained          Extremities: Edema: None  Fetal Status: Fetal Heart Rate (bpm): 140 Fundal Height: 35 cm Movement: Present    Chaperone: 05/01/2021   No results found for this or any previous visit (from the past 24 hour(s)).  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  Labs/procedures today: spec exam and CV swab  Plan:  Continue routine obstetrical care  Next visit: prefers will be in person for cultures     Reviewed: Preterm labor symptoms and general obstetric precautions including but not limited to vaginal  bleeding, contractions, leaking of fluid and fetal movement were reviewed in detail with the patient.  All questions were answered. Does have home bp cuff. Office bp cuff given: not applicable. Check bp weekly, let us know if consistently >140 and/or >90.  Follow-up: Return for weekly, CNM, in person.  No future appointments.  No orders of the defined types were placed in this encounter.  Cheral Marker CNM, [facility] 08/10/2021 2:19 PM

## 2021-08-12 LAB — CERVICOVAGINAL ANCILLARY ONLY
Bacterial Vaginitis (gardnerella): NEGATIVE
Candida Glabrata: NEGATIVE
Candida Vaginitis: POSITIVE — AB
Chlamydia: NEGATIVE
Comment: NEGATIVE
Comment: NEGATIVE
Comment: NEGATIVE
Comment: NEGATIVE
Comment: NEGATIVE
Comment: NORMAL
Neisseria Gonorrhea: NEGATIVE
Trichomonas: NEGATIVE

## 2021-08-17 ENCOUNTER — Ambulatory Visit (INDEPENDENT_AMBULATORY_CARE_PROVIDER_SITE_OTHER): Payer: Medicaid Other | Admitting: Advanced Practice Midwife

## 2021-08-17 ENCOUNTER — Encounter: Payer: Self-pay | Admitting: Advanced Practice Midwife

## 2021-08-17 ENCOUNTER — Other Ambulatory Visit: Payer: Self-pay

## 2021-08-17 ENCOUNTER — Encounter: Payer: Medicaid Other | Admitting: Advanced Practice Midwife

## 2021-08-17 ENCOUNTER — Encounter: Payer: Self-pay | Admitting: Obstetrics & Gynecology

## 2021-08-17 VITALS — BP 123/75 | HR 95 | Wt 291.0 lb

## 2021-08-17 DIAGNOSIS — O0993 Supervision of high risk pregnancy, unspecified, third trimester: Secondary | ICD-10-CM | POA: Diagnosis not present

## 2021-08-17 DIAGNOSIS — Z3493 Encounter for supervision of normal pregnancy, unspecified, third trimester: Secondary | ICD-10-CM

## 2021-08-17 DIAGNOSIS — Z3A36 36 weeks gestation of pregnancy: Secondary | ICD-10-CM

## 2021-08-17 NOTE — Progress Notes (Signed)
° °  LOW-RISK PREGNANCY VISIT Patient name: Leah Kennedy MRN [id number]  Date of birth: January 01, 1996 Chief Complaint:   Routine Prenatal Visit  History of Present Illness:   Leah Kennedy is a 26 y.o. [id number] female at [redacted]w[redacted]d with an Estimated Date of Delivery: 09/10/21 being seen today for ongoing management of a low-risk pregnancy.  Today she reports  being ready! . Contractions: Not present. Vag. Bleeding: None.  Movement: Present. denies leaking of fluid. Review of Systems:   Pertinent items are noted in HPI Denies abnormal vaginal discharge w/ itching/odor/irritation, headaches, visual changes, shortness of breath, chest pain, abdominal pain, severe nausea/vomiting, or problems with urination or bowel movements unless otherwise stated above. Pertinent History Reviewed:  Reviewed past medical,surgical, social, obstetrical and family history.  Reviewed problem list, medications and allergies. Physical Assessment:   Vitals:   08/17/21 0859  BP: 123/75  Pulse: 95  Weight: 291 lb (132 kg)  Body mass index is 46.97 kg/m.        Physical Examination:   General appearance: Well appearing, and in no distress  Mental status: Alert, oriented to person, place, and time  Skin: Warm & dry  Cardiovascular: Normal heart rate noted  Respiratory: Normal respiratory effort, no distress  Abdomen: Soft, gravid, nontender  Pelvic: Cervical exam performed  Dilation: 1 Effacement (%): Thick Station: -3  Extremities: Edema: None  Fetal Status: Fetal Heart Rate (bpm): 133 Fundal Height: 37 cm Movement: Present Presentation: Vertex  No results found for this or any previous visit (from the past 24 hour(s)).  Assessment & Plan:  1) Low-risk pregnancy [id number] at [redacted]w[redacted]d with an Estimated Date of Delivery: 09/10/21   2) Desires waterbirth/hydrotherapy, had class last preg; consent reviewed & signed today and scanned into media   Meds: No orders of the defined types were placed in this  encounter.  Labs/procedures today: GBS (GC/chlam neg 08/10/21)  Plan:  Continue routine obstetrical care   Reviewed: Term labor symptoms and general obstetric precautions including but not limited to vaginal bleeding, contractions, leaking of fluid and fetal movement were reviewed in detail with the patient.  All questions were answered. Has home bp cuff. Check bp weekly, let us know if >140/90.   Follow-up: Return for schedule 1 week LROBs x 3.  Orders Placed This Encounter  Procedures   Culture, beta strep (group b only)   [name] [facility] 08/17/2021 9:44 AM

## 2021-08-20 ENCOUNTER — Encounter (HOSPITAL_COMMUNITY): Payer: Self-pay | Admitting: Obstetrics & Gynecology

## 2021-08-20 ENCOUNTER — Inpatient Hospital Stay (HOSPITAL_COMMUNITY)
Admission: AD | Admit: 2021-08-20 | Discharge: 2021-08-20 | Disposition: A | Discharge status: Home / Self Care | Payer: Medicaid Other | Attending: Obstetrics & Gynecology | Admitting: Obstetrics & Gynecology

## 2021-08-20 ENCOUNTER — Other Ambulatory Visit: Payer: Self-pay

## 2021-08-20 ENCOUNTER — Encounter: Payer: Self-pay | Admitting: Advanced Practice Midwife

## 2021-08-20 DIAGNOSIS — R109 Unspecified abdominal pain: Secondary | ICD-10-CM | POA: Diagnosis not present

## 2021-08-20 DIAGNOSIS — Z3689 Encounter for other specified antenatal screening: Secondary | ICD-10-CM | POA: Diagnosis not present

## 2021-08-20 DIAGNOSIS — O26893 Other specified pregnancy related conditions, third trimester: Secondary | ICD-10-CM | POA: Diagnosis not present

## 2021-08-20 DIAGNOSIS — B951 Streptococcus, group B, as the cause of diseases classified elsewhere: Secondary | ICD-10-CM | POA: Insufficient documentation

## 2021-08-20 DIAGNOSIS — O479 False labor, unspecified: Secondary | ICD-10-CM | POA: Diagnosis not present

## 2021-08-20 DIAGNOSIS — M549 Dorsalgia, unspecified: Secondary | ICD-10-CM | POA: Insufficient documentation

## 2021-08-20 DIAGNOSIS — Z3A37 37 weeks gestation of pregnancy: Secondary | ICD-10-CM | POA: Insufficient documentation

## 2021-08-20 DIAGNOSIS — O471 False labor at or after 37 completed weeks of gestation: Secondary | ICD-10-CM | POA: Diagnosis present

## 2021-08-20 DIAGNOSIS — O368130 Decreased fetal movements, third trimester, not applicable or unspecified: Secondary | ICD-10-CM | POA: Insufficient documentation

## 2021-08-20 LAB — CULTURE, BETA STREP (GROUP B ONLY): Strep Gp B Culture: POSITIVE — AB

## 2021-08-20 NOTE — MAU Note (Signed)
..  Leah Kennedy is a 26 y.o. at [redacted]w[redacted]d here in MAU reporting: ABD cramping and back pain, that has been coming and going since Thursday,  but today around 8 am the pain has continued intermittently throughout the day and when she was laying down, repositioned, and the pain continued. Pt was not sure if they were CTXs cause of last pregnancies she didn't feel them until she was 6-7cm, but called the after hours nurse and was told she needed to come in. Pt denies VB, LOF, DFM, and last intercourse was last month.  Gbs + SVE in the office on Monday, 1 cm  Onset of complaint: 0800 Pain score: 6/10 abd and back  Vitals:   08/20/21 2135  BP: 133/74  Pulse: 97  Resp: 18  SpO2: 97%     FHT:135 Lab orders placed from triage:  none

## 2021-08-20 NOTE — MAU Provider Note (Signed)
History     CSN: [id number]  Arrival date and time: 08/20/21 2120  None    Chief Complaint  Patient presents with   Back Pain   Abdominal Pain   HPI Leah Kennedy is a 26 y.o. [id number] at [redacted]w[redacted]d who presents to MAU for decreased fetal movement. Patient reports decreased fetal movement since this morning. Initially told the RN that she was not concerned because "this always happens towards the end of her pregnancy", but decided to get checked out because her grandma was worried. She reports movement is usually "constant, nonstop" however today has only felt small kicks and rolls. Reports that she has been eating regular meals and snacks today. Has felt baby move 2 times since arrival to MAU. She reports some mild, irregular contractions. Denies leaking fluid or vaginal bleeding.   OB History     Gravida  4   Para  2   Term  2   Preterm      AB  1   Living  2      SAB  1   IAB      Ectopic      Multiple  0   Live Births  2           Past Medical History:  Diagnosis Date   Chlamydia 07/15/2018   tx 07/15/18 POC -/-      +11/19/19  tx 11/24/19, POC 04/21/20: +, tx 04/27/20  POC + 06/07/20, POC neg 09/10/20     +03/29/21  POC 06/20/21  -/-   Depression    Depression with anxiety 03/01/2021   No meds, declines IBH referral   Marijuana use 04/27/2020   Counseled via MyChart 04/27/2020    Counseled via MyChart 03/07/2021     Repeat___    Morbid obesity (HCC)    Prediabetes    Previous known suicide attempt as a teenage 07/08/2021    Past Surgical History:  Procedure Laterality Date   TONSILLECTOMY  2017   TOOTH EXTRACTION Right 01/2021   WISDOM TOOTH EXTRACTION Bilateral 2020    Family History  Problem Relation Age of Onset   Stroke Father    Alcohol abuse Father    Pulmonary disease Maternal Grandmother    Drug abuse Maternal Grandmother    Alcohol abuse Maternal Grandfather    Heart murmur Paternal Grandmother    Hypertension Paternal Grandmother     Irritable bowel syndrome Paternal Grandmother    Glaucoma Paternal Grandmother    Hypertension Paternal Grandfather    Diabetes Paternal Grandfather    Gout Paternal Grandfather    Kidney disease Paternal Grandfather    Glaucoma Paternal Grandfather     Social History   Tobacco Use   Smoking status: Former   Smokeless tobacco: Never  Building services engineer Use: Never used  Substance Use Topics   Alcohol use: Not Currently   Drug use: Not Currently    Types: Marijuana    Comment: before pregnancy    Allergies:  Allergies  Allergen Reactions   Carrot [Daucus Carota] Itching   Kiwi Extract Itching    No medications prior to admission.    Review of Systems  Constitutional: Negative.   Respiratory: Negative.    Cardiovascular: Negative.  Negative for chest pain.  Gastrointestinal:  Positive for abdominal pain (contractions).  Genitourinary: Negative.   Musculoskeletal: Negative.   Neurological: Negative.    Physical Exam   Blood pressure 123/81, pulse 96, resp. rate 18, height 5\' 6"  (1.676  m), weight 131.7 kg, last menstrual period 12/04/2020, SpO2 97 %, not currently breastfeeding.  Physical Exam Vitals and nursing note reviewed.  Constitutional:      General: She is not in acute distress.    Appearance: She is obese.  Cardiovascular:     Rate and Rhythm: Normal rate.  Pulmonary:     Effort: Pulmonary effort is normal.  Abdominal:     Palpations: Abdomen is soft.     Tenderness: There is no abdominal tenderness.     Comments: Gravid   Genitourinary:    Comments: VE: 1/Thick/-3 per RN Skin:    General: Skin is warm and dry.  Neurological:     General: No focal deficit present.     Mental Status: She is alert and oriented to person, place, and time.  Psychiatric:        Mood and Affect: Mood normal.        Behavior: Behavior normal.   NST FHR: 125 bpm, moderate variability, +15x15 accels, no decels Toco: quiet  MAU Course  Procedures NST  MDM NST  reactive. Patient endorses feeling fetal movement since arrival to MAU.  Assessment and Plan  [redacted] weeks gestation of pregnancy Braxton hicks contractions Decreased fetal movement NST reactive  - Discharge home in stable condition - Strict return precautions reviewed at length. Return to MAU sooner or as needed for worsening symptoms - Patient to keep OB appointment as scheduled on 08/24/2021    Brand Males, CNM 08/20/2021, 11:30 PM

## 2021-08-24 ENCOUNTER — Other Ambulatory Visit: Payer: Self-pay

## 2021-08-24 ENCOUNTER — Ambulatory Visit (INDEPENDENT_AMBULATORY_CARE_PROVIDER_SITE_OTHER): Payer: Medicaid Other | Admitting: Advanced Practice Midwife

## 2021-08-24 VITALS — BP 114/77 | HR 84 | Wt 290.0 lb

## 2021-08-24 DIAGNOSIS — Z3A37 37 weeks gestation of pregnancy: Secondary | ICD-10-CM

## 2021-08-24 DIAGNOSIS — Z3493 Encounter for supervision of normal pregnancy, unspecified, third trimester: Secondary | ICD-10-CM

## 2021-08-24 NOTE — Progress Notes (Signed)
° °  LOW-RISK PREGNANCY VISIT Patient name: Leah Kennedy MRN [id number]  Date of birth: 04/22/1996 Chief Complaint:   Routine Prenatal Visit  History of Present Illness:   Leah Kennedy is a 26 y.o. [id number] female at [redacted]w[redacted]d with an Estimated Date of Delivery: 09/10/21 being seen today for ongoing management of a low-risk pregnancy.  Today she reports  being ready! Feeling less FM that before, but still daily. Contractions: Not present. Vag. Bleeding: None.  Movement: Present. denies leaking of fluid. Review of Systems:   Pertinent items are noted in HPI Denies abnormal vaginal discharge w/ itching/odor/irritation, headaches, visual changes, shortness of breath, chest pain, abdominal pain, severe nausea/vomiting, or problems with urination or bowel movements unless otherwise stated above. Pertinent History Reviewed:  Reviewed past medical,surgical, social, obstetrical and family history.  Reviewed problem list, medications and allergies. Physical Assessment:   Vitals:   08/24/21 1130  BP: 114/77  Pulse: 84  Weight: 290 lb (131.5 kg)  Body mass index is 46.81 kg/m.        Physical Examination:   General appearance: Well appearing, and in no distress  Mental status: Alert, oriented to person, place, and time  Skin: Warm & dry  Cardiovascular: Normal heart rate noted  Respiratory: Normal respiratory effort, no distress  Abdomen: Soft, gravid, nontender  Pelvic: Cervical exam performed  Dilation: 1.5 Effacement (%): Thick Station: -3  Extremities: Edema: None  Fetal Status: Fetal Heart Rate (bpm): 130 Fundal Height: 37 cm Movement: Present Presentation: Vertex  No results found for this or any previous visit (from the past 24 hour(s)).  Assessment & Plan:  1) Low-risk pregnancy [id number] at [redacted]w[redacted]d with an Estimated Date of Delivery: 09/10/21   2) Wants waterbirth, all criteria met   Meds: No orders of the defined types were placed in this encounter.  Labs/procedures today:  SVE  Plan:  Continue routine obstetrical care   Reviewed: Term labor symptoms and general obstetric precautions including but not limited to vaginal bleeding, contractions, leaking of fluid and fetal movement were reviewed in detail with the patient.  All questions were answered. Has home bp cuff.  Check bp weekly, let us know if >140/90.   Follow-up: Return for As scheduled.  No orders of the defined types were placed in this encounter.  Myrtis Ser CNM 08/24/2021 12:03 PM

## 2021-08-31 ENCOUNTER — Other Ambulatory Visit: Payer: Self-pay

## 2021-08-31 ENCOUNTER — Ambulatory Visit (INDEPENDENT_AMBULATORY_CARE_PROVIDER_SITE_OTHER): Payer: Medicaid Other | Admitting: Obstetrics & Gynecology

## 2021-08-31 VITALS — BP 107/67 | HR 112 | Wt 292.2 lb

## 2021-08-31 DIAGNOSIS — Z3480 Encounter for supervision of other normal pregnancy, unspecified trimester: Secondary | ICD-10-CM

## 2021-08-31 NOTE — Progress Notes (Signed)
° °  LOW-RISK PREGNANCY VISIT Patient name: Leah Kennedy MRN [id number]  Date of birth: 1996/05/25 Chief Complaint:   Routine Prenatal Visit  History of Present Illness:   Leah Kennedy is a 26 y.o. [id number] female at [redacted]w[redacted]d with an Estimated Date of Delivery: 09/10/21 being seen today for ongoing management of a low-risk pregnancy.   Depression screen [facility] 2/9 06/20/2021 03/01/2021 04/21/2020 11/19/2019 07/01/2018  Decreased Interest 1 2 0 2 1  Down, Depressed, Hopeless 1 2 0 0 0  PHQ - 2 Score 2 4 0 2 1  Altered sleeping 3 2 2 1 2   Tired, decreased energy 0 2 2 2 1   Change in appetite 0 2 2 2 1   Feeling bad or failure about yourself  0 2 0 1 0  Trouble concentrating 0 2 0 0 0  Moving slowly or fidgety/restless 0 0 0 0 0  Suicidal thoughts 0 0 0 0 0  PHQ-9 Score 5 14 6 8 5   Difficult doing work/chores - - - Not difficult at all -    Today she reports  pelvic discomfort . Contractions: Irritability. Vag. Bleeding: None.  Movement: Present. denies leaking of fluid. Review of Systems:   Pertinent items are noted in HPI Denies abnormal vaginal discharge w/ itching/odor/irritation, headaches, visual changes, shortness of breath, chest pain, abdominal pain, severe nausea/vomiting, or problems with urination or bowel movements unless otherwise stated above. Pertinent History Reviewed:  Reviewed past medical,surgical, social, obstetrical and family history.  Reviewed problem list, medications and allergies.  Physical Assessment:   Vitals:   08/31/21 1330  BP: 107/67  Pulse: (!) 112  Weight: 292 lb 3.2 oz (132.5 kg)  Body mass index is 47.16 kg/m.        Physical Examination:   General appearance: Well appearing, and in no distress  Mental status: Alert, oriented to person, place, and time  Skin: Warm & dry  Respiratory: Normal respiratory effort, no distress  Abdomen: Soft, gravid, nontender  Pelvic: Cervical exam performed  Dilation: 1.5 Effacement (%): Thick Station:  -3  Extremities: Edema: None  Psych:  mood and affect appropriate  Fetal Status: Fetal Heart Rate (bpm): 140 Fundal Height: 38 cm Movement: Present Presentation: Vertex  Chaperone:  declined     No results found for this or any previous visit (from the past 24 hour(s)).   Assessment & Plan:  1) Low-risk pregnancy at [redacted]w[redacted]d with an Estimated Date of Delivery: 09/10/21   -desires waterbirth -GBS positive   Meds: No orders of the defined types were placed in this encounter.  Labs/procedures today: none  Plan:  Continue routine obstetrical care  Next visit: prefers in person    Reviewed: Term labor symptoms and general obstetric precautions including but not limited to vaginal bleeding, contractions, leaking of fluid and fetal movement were reviewed in detail with the patient.  All questions were answered. Pt has home bp cuff. Check bp weekly, let 10/29/21 know if >140/90.   Follow-up: Return in about 1 week (around 09/07/2021) for LROB visit with CNM.  No orders of the defined types were placed in this encounter.   [redacted]w[redacted]d, DO Attending Obstetrician & Gynecologist, [facility] for [place], [facility]

## 2021-09-02 ENCOUNTER — Ambulatory Visit (INDEPENDENT_AMBULATORY_CARE_PROVIDER_SITE_OTHER): Payer: Medicaid Other | Admitting: Advanced Practice Midwife

## 2021-09-02 ENCOUNTER — Encounter: Payer: Self-pay | Admitting: Advanced Practice Midwife

## 2021-09-02 ENCOUNTER — Inpatient Hospital Stay (HOSPITAL_COMMUNITY)
Admission: AD | Admit: 2021-09-02 | Discharge: 2021-09-05 | DRG: 807 | Disposition: A | Discharge status: Home / Self Care | Payer: Medicaid Other | Attending: Obstetrics and Gynecology | Admitting: Obstetrics and Gynecology

## 2021-09-02 ENCOUNTER — Telehealth: Payer: Self-pay | Admitting: Family Medicine

## 2021-09-02 ENCOUNTER — Other Ambulatory Visit: Payer: Self-pay

## 2021-09-02 ENCOUNTER — Telehealth: Payer: Self-pay | Admitting: *Deleted

## 2021-09-02 VITALS — BP 129/83 | HR 106 | Wt 293.0 lb

## 2021-09-02 DIAGNOSIS — F418 Other specified anxiety disorders: Secondary | ICD-10-CM | POA: Diagnosis present

## 2021-09-02 DIAGNOSIS — B951 Streptococcus, group B, as the cause of diseases classified elsewhere: Secondary | ICD-10-CM | POA: Diagnosis present

## 2021-09-02 DIAGNOSIS — O99824 Streptococcus B carrier state complicating childbirth: Secondary | ICD-10-CM | POA: Diagnosis present

## 2021-09-02 DIAGNOSIS — Z3A39 39 weeks gestation of pregnancy: Secondary | ICD-10-CM

## 2021-09-02 DIAGNOSIS — Z3493 Encounter for supervision of normal pregnancy, unspecified, third trimester: Secondary | ICD-10-CM

## 2021-09-02 DIAGNOSIS — Z2839 Other underimmunization status: Secondary | ICD-10-CM

## 2021-09-02 DIAGNOSIS — Z20822 Contact with and (suspected) exposure to covid-19: Secondary | ICD-10-CM | POA: Diagnosis present

## 2021-09-02 DIAGNOSIS — O09899 Supervision of other high risk pregnancies, unspecified trimester: Secondary | ICD-10-CM

## 2021-09-02 DIAGNOSIS — Z87891 Personal history of nicotine dependence: Secondary | ICD-10-CM

## 2021-09-02 DIAGNOSIS — Z30430 Encounter for insertion of intrauterine contraceptive device: Secondary | ICD-10-CM

## 2021-09-02 DIAGNOSIS — Z3A38 38 weeks gestation of pregnancy: Secondary | ICD-10-CM

## 2021-09-02 DIAGNOSIS — O99214 Obesity complicating childbirth: Principal | ICD-10-CM | POA: Diagnosis present

## 2021-09-02 NOTE — MAU Note (Signed)
Pt reports to MAU stating she has had ctx all day but they got closer together around 2030 and says they are 5-6 minutes apart. Says after her OB appointment today she lost her mucous plug.. noticed a blood clot originally but not VB since, still has mucous like discharge. No LOF. Says has not felt baby move in the past few hours but was not concerned.. was feeling movement earlier today. Also reports diarrhea that has been watery with 3 occurrences.

## 2021-09-02 NOTE — Progress Notes (Signed)
° °  LOW-RISK PREGNANCY VISIT- work in for [employer] and ctx Patient name: Leah Kennedy MRN [id number]  Date of birth: 1996/02/19 Chief Complaint:   Routine Prenatal Visit (+ contractions every 5-7 minutes)  History of Present Illness:   Leah Kennedy is a 26 y.o. [phone] female at [redacted]w[redacted]d with an Estimated Date of Delivery: 09/10/21 being seen today for ongoing management of a low-risk pregnancy.  Today she reports  attempt at [facility] this morning that then turned into reg ctx q 5-7 mins . Contractions: Regular. Vag. Bleeding: None.  Movement: Present. denies leaking of fluid. Review of Systems:   Pertinent items are noted in HPI Denies abnormal vaginal discharge w/ itching/odor/irritation, headaches, visual changes, shortness of breath, chest pain, abdominal pain, severe nausea/vomiting, or problems with urination or bowel movements unless otherwise stated above. Pertinent History Reviewed:  Reviewed past medical,surgical, social, obstetrical and family history.  Reviewed problem list, medications and allergies. Physical Assessment:   Vitals:   09/02/21 1118  BP: 129/83  Pulse: (!) 106  Weight: 293 lb (132.9 kg)  Body mass index is 47.29 kg/m.        Physical Examination:   General appearance: Well appearing, and in no distress  Mental status: Alert, oriented to person, place, and time  Skin: Warm & dry  Cardiovascular: Normal heart rate noted  Respiratory: Normal respiratory effort, no distress  Abdomen: Soft, gravid, nontender  Pelvic: Cervical exam performed  Dilation: 2 Effacement (%): Thick Station: -3  Extremities: Edema: None  Fetal Status: Fetal Heart Rate (bpm): 150s NST   Movement: Present Presentation: Vertex  No results found for this or any previous visit (from the past 24 hour(s)).  Assessment & Plan:  1) Low-risk pregnancy [id number] at [redacted]w[redacted]d with an Estimated Date of Delivery: 09/10/21   2) Prodromal vs early labor, labor precautions given and advised when to present to  [facility]   Meds: No orders of the defined types were placed in this encounter.  Labs/procedures today: NST- baseline 130s, +accels, occ variables; toco not picking up ctx, but seems to be 3-5 mins  Plan:  Continue routine obstetrical care if labor doesn't occur  Reviewed: Term labor symptoms and general obstetric precautions including but not limited to vaginal bleeding, contractions, leaking of fluid and fetal movement were reviewed in detail with the patient.  All questions were answered. Has home bp cuff. Check bp weekly, let us know if >140/90.   Follow-up: Return for As scheduled.  No orders of the defined types were placed in this encounter.  [name] CNM 09/02/2021 12:29 PM

## 2021-09-02 NOTE — Telephone Encounter (Signed)
Patient states she woke up this morning with the urge to have a BM.  She went to the bathroom but did not do anything.  She then starting having cramping that has now turned into contractions.  This has been going on for the last hour and coming every 5-7 minutes.  States the pain is slightly annoying and is unsure if she is in labor.  Also states the baby is not moving as much this morning.  Advised patient to come in for labor check and NST for decreased fetal movement.  If pain gets worse, she should [facility] straight [facility]. Pt verbalized understanding and appt scheduled.

## 2021-09-02 NOTE — Telephone Encounter (Signed)
Patient called RN for patient having passed one clot while peeing. Patient reports continued contractions about every 10 minutes. Was checked in the office and was 2 cm.   Recommended patient monitor and if clot is < quarter sized she can remain at home. If clots continue patient needs to come to the MAU. If ctx every 5 min come to the MAU.

## 2021-09-03 ENCOUNTER — Inpatient Hospital Stay (HOSPITAL_COMMUNITY): Payer: Medicaid Other | Admitting: Anesthesiology

## 2021-09-03 ENCOUNTER — Encounter (HOSPITAL_COMMUNITY): Payer: Self-pay | Admitting: Obstetrics & Gynecology

## 2021-09-03 DIAGNOSIS — O26893 Other specified pregnancy related conditions, third trimester: Secondary | ICD-10-CM | POA: Diagnosis not present

## 2021-09-03 DIAGNOSIS — Z3A39 39 weeks gestation of pregnancy: Secondary | ICD-10-CM | POA: Diagnosis not present

## 2021-09-03 DIAGNOSIS — O99214 Obesity complicating childbirth: Secondary | ICD-10-CM | POA: Diagnosis not present

## 2021-09-03 DIAGNOSIS — Z30430 Encounter for insertion of intrauterine contraceptive device: Secondary | ICD-10-CM | POA: Diagnosis not present

## 2021-09-03 DIAGNOSIS — Z3490 Encounter for supervision of normal pregnancy, unspecified, unspecified trimester: Secondary | ICD-10-CM | POA: Insufficient documentation

## 2021-09-03 DIAGNOSIS — Z87891 Personal history of nicotine dependence: Secondary | ICD-10-CM | POA: Diagnosis not present

## 2021-09-03 DIAGNOSIS — Z20822 Contact with and (suspected) exposure to covid-19: Secondary | ICD-10-CM | POA: Diagnosis not present

## 2021-09-03 DIAGNOSIS — O99824 Streptococcus B carrier state complicating childbirth: Secondary | ICD-10-CM | POA: Diagnosis not present

## 2021-09-03 LAB — CBC
HCT: 34.2 % — ABNORMAL LOW (ref 36.0–46.0)
Hemoglobin: 10.7 g/dL — ABNORMAL LOW (ref 12.0–15.0)
MCH: 26.2 pg (ref 26.0–34.0)
MCHC: 31.3 g/dL (ref 30.0–36.0)
MCV: 83.6 fL (ref 80.0–100.0)
Platelets: 299 10*3/uL (ref 150–400)
RBC: 4.09 MIL/uL (ref 3.87–5.11)
RDW: 14.6 % (ref 11.5–15.5)
WBC: 12.9 10*3/uL — ABNORMAL HIGH (ref 4.0–10.5)
nRBC: 0 % (ref 0.0–0.2)

## 2021-09-03 LAB — TYPE AND SCREEN
ABO/RH(D): AB POS
Antibody Screen: NEGATIVE

## 2021-09-03 LAB — RESP PANEL BY RT-PCR (FLU A&B, COVID) ARPGX2
Influenza A by PCR: NEGATIVE
Influenza B by PCR: NEGATIVE
SARS Coronavirus 2 by RT PCR: NEGATIVE

## 2021-09-03 LAB — RPR: RPR Ser Ql: NONREACTIVE

## 2021-09-03 MED ORDER — PHENYLEPHRINE 40 MCG/ML (10ML) SYRINGE FOR IV PUSH (FOR BLOOD PRESSURE SUPPORT): 80.0000 ug | PREFILLED_SYRINGE | INTRAVENOUS | Status: DC | PRN

## 2021-09-03 MED ORDER — LACTATED RINGERS IV SOLN: 500.0000 mL | Freq: Once | INTRAVENOUS | Status: DC

## 2021-09-03 MED ORDER — FENTANYL CITRATE (PF) 100 MCG/2ML IJ SOLN
50.0000 ug | INTRAMUSCULAR | Status: DC | PRN
  Administered 2021-09-03: 50 ug via INTRAVENOUS

## 2021-09-03 MED ORDER — PRENATAL MULTIVITAMIN CH
1.0000 | ORAL_TABLET | Freq: Every day | ORAL | Status: DC
  Administered 2021-09-03 – 2021-09-04 (×2): 1 via ORAL
  Filled 2021-09-03 (×2): qty 1

## 2021-09-03 MED ORDER — PENICILLIN G POT IN DEXTROSE 60000 UNIT/ML IV SOLN: 3.0000 10*6.[IU] | INTRAVENOUS | Status: DC

## 2021-09-03 MED ORDER — TETANUS-DIPHTH-ACELL PERTUSSIS 5-2.5-18.5 LF-MCG/0.5 IM SUSY: 0.5000 mL | PREFILLED_SYRINGE | Freq: Once | INTRAMUSCULAR | Status: DC

## 2021-09-03 MED ORDER — ONDANSETRON HCL 4 MG/2ML IJ SOLN: 4.0000 mg | Freq: Four times a day (QID) | INTRAMUSCULAR | Status: DC | PRN

## 2021-09-03 MED ORDER — ACETAMINOPHEN 325 MG PO TABS
650.0000 mg | ORAL_TABLET | ORAL | Status: DC | PRN
  Administered 2021-09-05 (×2): 650 mg via ORAL
  Filled 2021-09-03 (×2): qty 2

## 2021-09-03 MED ORDER — ONDANSETRON HCL 4 MG/2ML IJ SOLN: 4.0000 mg | INTRAMUSCULAR | Status: DC | PRN

## 2021-09-03 MED ORDER — FENTANYL-BUPIVACAINE-NACL 0.5-0.125-0.9 MG/250ML-% EP SOLN: 12.0000 mL/h | EPIDURAL | Status: DC | PRN

## 2021-09-03 MED ORDER — OXYTOCIN-SODIUM CHLORIDE 30-0.9 UT/500ML-% IV SOLN
2.5000 [IU]/h | INTRAVENOUS | Status: DC
  Filled 2021-09-03: qty 500

## 2021-09-03 MED ORDER — LIDOCAINE HCL (PF) 1 % IJ SOLN: 30.0000 mL | INTRAMUSCULAR | Status: DC | PRN

## 2021-09-03 MED ORDER — BENZOCAINE-MENTHOL 20-0.5 % EX AERO
1.0000 "application " | INHALATION_SPRAY | CUTANEOUS | Status: DC | PRN
  Administered 2021-09-03: 1 via TOPICAL
  Filled 2021-09-03: qty 56

## 2021-09-03 MED ORDER — WITCH HAZEL-GLYCERIN EX PADS: 1.0000 "application " | MEDICATED_PAD | CUTANEOUS | Status: DC | PRN

## 2021-09-03 MED ORDER — DIPHENHYDRAMINE HCL 50 MG/ML IJ SOLN: 12.5000 mg | INTRAMUSCULAR | Status: DC | PRN

## 2021-09-03 MED ORDER — FLEET ENEMA 7-19 GM/118ML RE ENEM: 1.0000 | ENEMA | RECTAL | Status: DC | PRN

## 2021-09-03 MED ORDER — TRANEXAMIC ACID-NACL 1000-0.7 MG/100ML-% IV SOLN
1000.0000 mg | Freq: Once | INTRAVENOUS | Status: AC
  Administered 2021-09-03: 1000 mg via INTRAVENOUS

## 2021-09-03 MED ORDER — ONDANSETRON HCL 4 MG PO TABS: 4.0000 mg | ORAL_TABLET | ORAL | Status: DC | PRN

## 2021-09-03 MED ORDER — TRANEXAMIC ACID-NACL 1000-0.7 MG/100ML-% IV SOLN
INTRAVENOUS | Status: AC
  Filled 2021-09-03: qty 100

## 2021-09-03 MED ORDER — MEDROXYPROGESTERONE ACETATE 150 MG/ML IM SUSP: 150.0000 mg | INTRAMUSCULAR | Status: DC | PRN

## 2021-09-03 MED ORDER — LACTATED RINGERS IV SOLN: INTRAVENOUS | Status: DC

## 2021-09-03 MED ORDER — SODIUM CHLORIDE 0.9 % IV SOLN
5.0000 10*6.[IU] | Freq: Once | INTRAVENOUS | Status: DC
  Filled 2021-09-03: qty 5

## 2021-09-03 MED ORDER — FENTANYL-BUPIVACAINE-NACL 0.5-0.125-0.9 MG/250ML-% EP SOLN
EPIDURAL | Status: AC
  Filled 2021-09-03: qty 250

## 2021-09-03 MED ORDER — IBUPROFEN 600 MG PO TABS
600.0000 mg | ORAL_TABLET | Freq: Four times a day (QID) | ORAL | Status: DC
  Administered 2021-09-03 – 2021-09-05 (×7): 600 mg via ORAL
  Filled 2021-09-03 (×8): qty 1

## 2021-09-03 MED ORDER — COVID-19MRNA BIVAL VACC PFIZER 30 MCG/0.3ML IM SUSP: 0.3000 mL | Freq: Once | INTRAMUSCULAR | Status: DC

## 2021-09-03 MED ORDER — MEASLES, MUMPS & RUBELLA VAC IJ SOLR: 0.5000 mL | Freq: Once | INTRAMUSCULAR | Status: DC

## 2021-09-03 MED ORDER — SOD CITRATE-CITRIC ACID 500-334 MG/5ML PO SOLN: 30.0000 mL | ORAL | Status: DC | PRN

## 2021-09-03 MED ORDER — DIPHENHYDRAMINE HCL 25 MG PO CAPS: 25.0000 mg | ORAL_CAPSULE | Freq: Four times a day (QID) | ORAL | Status: DC | PRN

## 2021-09-03 MED ORDER — FENTANYL CITRATE (PF) 100 MCG/2ML IJ SOLN
50.0000 ug | INTRAMUSCULAR | Status: DC | PRN
  Administered 2021-09-03: 50 ug via INTRAVENOUS
  Filled 2021-09-03 (×2): qty 2

## 2021-09-03 MED ORDER — LACTATED RINGERS IV SOLN: 500.0000 mL | INTRAVENOUS | Status: DC | PRN

## 2021-09-03 MED ORDER — ACETAMINOPHEN 325 MG PO TABS: 650.0000 mg | ORAL_TABLET | ORAL | Status: DC | PRN

## 2021-09-03 MED ORDER — COVID-19 MRNA VAC-TRIS(PFIZER) 30 MCG/0.3ML IM SUSP
0.3000 mL | Freq: Once | INTRAMUSCULAR | Status: AC
  Administered 2021-09-04: 0.3 mL via INTRAMUSCULAR
  Filled 2021-09-03: qty 0.3

## 2021-09-03 MED ORDER — OXYTOCIN BOLUS FROM INFUSION
333.0000 mL | Freq: Once | INTRAVENOUS | Status: AC
  Administered 2021-09-03: 333 mL via INTRAVENOUS

## 2021-09-03 MED ORDER — COCONUT OIL OIL
1.0000 "application " | TOPICAL_OIL | Status: DC | PRN
  Administered 2021-09-03: 1 via TOPICAL

## 2021-09-03 MED ORDER — LEVONORGESTREL 20.1 MCG/DAY IU IUD
1.0000 | INTRAUTERINE_SYSTEM | Freq: Once | INTRAUTERINE | Status: AC
  Administered 2021-09-03: 1 via INTRAUTERINE
  Filled 2021-09-03: qty 1

## 2021-09-03 MED ORDER — SODIUM CHLORIDE 0.9 % IV SOLN: 1.0000 g | INTRAVENOUS | Status: DC

## 2021-09-03 MED ORDER — DIBUCAINE (PERIANAL) 1 % EX OINT: 1.0000 "application " | TOPICAL_OINTMENT | CUTANEOUS | Status: DC | PRN

## 2021-09-03 MED ORDER — SODIUM CHLORIDE 0.9 % IV SOLN
2.0000 g | Freq: Once | INTRAVENOUS | Status: AC
  Administered 2021-09-03: 2 g via INTRAVENOUS
  Filled 2021-09-03: qty 2000

## 2021-09-03 MED ORDER — SENNOSIDES-DOCUSATE SODIUM 8.6-50 MG PO TABS
2.0000 | ORAL_TABLET | Freq: Every day | ORAL | Status: DC
  Administered 2021-09-04: 2 via ORAL
  Filled 2021-09-03: qty 2

## 2021-09-03 MED ORDER — FENTANYL-BUPIVACAINE-NACL 0.5-0.125-0.9 MG/250ML-% EP SOLN
EPIDURAL | Status: DC | PRN
  Administered 2021-09-03: 12 mL/h via EPIDURAL

## 2021-09-03 MED ORDER — EPHEDRINE 5 MG/ML INJ: 10.0000 mg | INTRAVENOUS | Status: DC | PRN

## 2021-09-03 MED ORDER — FERROUS SULFATE 325 (65 FE) MG PO TABS
325.0000 mg | ORAL_TABLET | ORAL | Status: DC
  Administered 2021-09-03: 325 mg via ORAL
  Filled 2021-09-03: qty 1

## 2021-09-03 MED ORDER — SIMETHICONE 80 MG PO CHEW: 80.0000 mg | CHEWABLE_TABLET | ORAL | Status: DC | PRN

## 2021-09-03 MED ORDER — LIDOCAINE HCL (PF) 1 % IJ SOLN
INTRAMUSCULAR | Status: DC | PRN
Start: 2021-09-03 — End: 2021-09-03
  Administered 2021-09-03: 5 mL via EPIDURAL

## 2021-09-03 NOTE — Lactation Note (Signed)
This note was copied from a baby's chart. Lactation Consultation Note Mom called for Lactation to come to L&D. Mom stated the baby came off the breast but she got her back on. Mom asked if she was on good. LC stated it looked good, asked mom if it hurt mom stated no. Praised mom. Mom doesn't have any support person at bedside at this time.  Patient Name: Leah Kennedy [id number] Date: 09/03/2021 Reason for consult: L&D Initial assessment;Mother's request;Term Age:75 hours  Maternal Data Does the patient have breastfeeding experience prior to this delivery?: Yes How long did the patient breastfeed?: 5 months to her 26 yr old. didn't BF her 75 month old. latching issues, stomach issues  Feeding    LATCH Score Latch: Grasps breast easily, tongue down, lips flanged, rhythmical sucking.  Audible Swallowing: A few with stimulation  Type of Nipple: Everted at rest and after stimulation  Comfort (Breast/Nipple): Soft / non-tender  Hold (Positioning): Assistance needed to correctly position infant at breast and maintain latch.  LATCH Score: 8   Lactation Tools Discussed/Used    Interventions Interventions: Breast feeding basics reviewed;Adjust position;Assisted with latch;Support pillows;Skin to skin;Breast compression  Discharge    Consult Status Consult Status: Follow-up from L&D Date: 09/03/21 Follow-up type: In-patient    Charyl Dancer 09/03/2021, 6:25 AM

## 2021-09-03 NOTE — MAU Provider Note (Signed)
History     CSN: [id number]  Arrival date and time: 09/02/21 2347   Event Date/Time   First Provider Initiated Contact with Patient 09/03/21 0036      Chief Complaint  Patient presents with   Contractions   [name] is a 26 y.o. year old [id number] female at [redacted]w[redacted]d weeks gestation who presents to MAU reporting contractions all day, but closer together since 2030; every 5-6 minutes. She lost her mucous plug with blood clot in it. She also reports DFM over the past few hours. She states she was concerned about the DFM since she felt FM earlier today. She was seen at [facility] earlier. She reports being dilated to 2 cm.   OB History     Gravida  4   Para  2   Term  2   Preterm      AB  1   Living  2      SAB  1   IAB      Ectopic      Multiple  0   Live Births  2           Past Medical History:  Diagnosis Date   Chlamydia 07/15/2018   tx 07/15/18 POC -/-      +11/19/19  tx 11/24/19, POC 04/21/20: +, tx 04/27/20  POC + 06/07/20, POC neg 09/10/20     +03/29/21  POC 06/20/21  -/-   Depression    Depression with anxiety 03/01/2021   No meds, declines IBH referral   Marijuana use 04/27/2020   Counseled via MyChart 04/27/2020    Counseled via MyChart 03/07/2021     Repeat___    Morbid obesity (HCC)    Prediabetes    Previous known suicide attempt as a teenage 07/08/2021    Past Surgical History:  Procedure Laterality Date   TONSILLECTOMY  2017   TOOTH EXTRACTION Right 01/2021   WISDOM TOOTH EXTRACTION Bilateral 2020    Family History  Problem Relation Age of Onset   Stroke Father    Alcohol abuse Father    Pulmonary disease Maternal Grandmother    Drug abuse Maternal Grandmother    Alcohol abuse Maternal Grandfather    Heart murmur Paternal Grandmother    Hypertension Paternal Grandmother    Irritable bowel syndrome Paternal Grandmother    Glaucoma Paternal Grandmother    Hypertension Paternal Grandfather    Diabetes Paternal Grandfather     Gout Paternal Grandfather    Kidney disease Paternal Grandfather    Glaucoma Paternal Grandfather     Social History   Tobacco Use   Smoking status: Former   Smokeless tobacco: Never  Building services engineer Use: Never used  Substance Use Topics   Alcohol use: Not Currently   Drug use: Not Currently    Types: Marijuana    Comment: before pregnancy    Allergies:  Allergies  Allergen Reactions   Carrot [Daucus Carota] Itching   Kiwi Extract Itching    Medications Prior to Admission  Medication Sig Dispense Refill Last Dose   Biotin w/ Vitamins C & E (HAIR/SKIN/NAILS PO) Take by mouth.   09/03/2021   cyclobenzaprine (FLEXERIL) 10 MG tablet Take 1 tablet (10 mg total) by mouth every 8 (eight) hours as needed for muscle spasms. 30 tablet 1 09/02/2021   Prenatal MV & Min w/FA-DHA (PRENATAL GUMMIES PO) Take 0.5 tablets by mouth 2 (two) times daily.   09/03/2021   thiamine (VITAMIN B-1) 100 MG  tablet Take 100 mg by mouth daily.   09/03/2021   vitamin C (ASCORBIC ACID) 500 MG tablet Take 500 mg by mouth daily.   09/03/2021   aspirin 81 MG EC tablet Take 1 tablet (81 mg total) by mouth daily. Swallow whole. (Patient not taking: Reported on 08/24/2021) 90 tablet 3    ferrous sulfate 325 (65 FE) MG tablet Take 1 tablet (325 mg total) by mouth every other day. (Patient not taking: Reported on 08/24/2021) 45 tablet 2    QUEtiapine (SEROQUEL) 25 MG tablet Take 1 tablet (25 mg total) by mouth at bedtime. (Patient not taking: Reported on 08/31/2021) 30 tablet 3    sertraline (ZOLOFT) 25 MG tablet Take 1 tablet (25 mg total) by mouth at bedtime. (Patient not taking: Reported on 08/24/2021) 30 tablet 3     Review of Systems  Constitutional: Negative.   HENT: Negative.    Eyes: Negative.   Respiratory: Negative.    Cardiovascular: Negative.   Gastrointestinal: Negative.   Endocrine: Negative.   Genitourinary:  Positive for pelvic pain (contractions).       DFM  Musculoskeletal: Negative.   Skin:  Negative.   Allergic/Immunologic: Negative.   Neurological: Negative.   Hematological: Negative.   Psychiatric/Behavioral: Negative.    Physical Exam   Blood pressure 125/78, pulse (!) 105, temperature 98.3 F (36.8 C), temperature source Oral, height 5\' 7"  (1.702 m), weight 133.4 kg, last menstrual period 12/04/2020, SpO2 99 %, not currently breastfeeding.  Physical Exam Vitals and nursing note reviewed.  Constitutional:      Appearance: Normal appearance. She is obese.  Cardiovascular:     Rate and Rhythm: Tachycardia present.  Pulmonary:     Effort: Pulmonary effort is normal.  Genitourinary:    Comments: Cx: 2.5 cm/50%/soft (by [place], RN) Skin:    General: Skin is warm and dry.  Neurological:     Mental Status: She is alert and oriented to person, place, and time.  Psychiatric:        Mood and Affect: Mood normal.        Behavior: Behavior normal.        Thought Content: Thought content normal.        Judgment: Judgment normal.   Reassessment @ 0127: Dilation: 4 Effacement (%): 50 Station: -3 Presentation: Vertex Exam by:: [email or web address], RN   REACTIVE NST - FHR: 125 bpm / moderate variability / accels present / decels absent / TOCO: regular every 2-5 mins  MAU Course  Procedures  MDM EFM Cervical check  Assessment and Plan  26 yo 30 at [redacted]w[redacted]d gestation Cervical change RN advised to call labor team for admission orders.  [redacted]w[redacted]d, CNM 09/03/2021, 12:37 AM

## 2021-09-03 NOTE — Progress Notes (Signed)
[name], female   DOB: [birth year], 26 y.o.   MRN: [id number]  Getting comfortable w epidural; had a dose of Fentanyl while tub was filling and then decided to have epidural placed; Amp x 1 dose  BP 109/55 FHR 130s, +accels, no decels Ctx q 2 mins Cx 6/70/vtx -2 (prior to epidural placement)  IUP@39 .0wks Latent labor GBS pos  She will rest now; plan to check cx in 2hrs or so; anticipate vag del  [name] [facility] 09/03/2021 4:52 AM

## 2021-09-03 NOTE — Lactation Note (Signed)
This note was copied from a baby's chart. Lactation Consultation Note  Patient Name: Leah Kennedy FYBOF'B Date: 09/03/2021 Reason for consult: Initial assessment;Term Age:24 hours   LC Note:  Attempted to visit with mother, however, she was asleep.  Will return later today.   Maternal Data Does the patient have breastfeeding experience prior to this delivery?: Yes How long did the patient breastfeed?: 5 months to her 24 yr old. didn't BF her 41 month old. latching issues, stomach issues  Feeding Mother's Current Feeding Choice: Breast Milk  LATCH Score Latch: Grasps breast easily, tongue down, lips flanged, rhythmical sucking.  Audible Swallowing: None  Type of Nipple: Everted at rest and after stimulation  Comfort (Breast/Nipple): Soft / non-tender  Hold (Positioning): No assistance needed to correctly position infant at breast.  LATCH Score: 8   Lactation Tools Discussed/Used    Interventions Interventions: Breast feeding basics reviewed;Adjust position;Assisted with latch;Support pillows;Skin to skin;Breast compression  Discharge    Consult Status Consult Status: Follow-up from L&D Date: 09/03/21 Follow-up type: In-patient    [name] R Aaliyan Brinkmeier 09/03/2021, 9:16 AM

## 2021-09-03 NOTE — Procedures (Addendum)
POST-PLACENTAL IUD INSERTION PROCEDURE NOTE Patient name: Leah Kennedy MRN [id number]  Date of birth: 18-Jan-1996  The risks and benefits of the method and placement have been thouroughly reviewed with the patient and all questions were answered.  Specifically the patient is aware of failure rate of 07/998, expulsion of the IUD and of possible perforation.  The patient is aware of irregular bleeding due to the method and understands the incidence of irregular bleeding diminishes with time.   Signed copy of informed consent in chart.   Vaginal, labial and perineal areas thoroughly inspected for lacerations. Lacerations: none laceration identified and if needed was repaired prior to IUD insertion  Pt's IUD choice: Liletta  Time out was performed.    IUD removed from insertion device and grasped between sterile gloved fingers. Fundus identified through abdominal wall using non-insertion hand. IUD inserted to fundus with bimanual technique. IUD carefully released at the fundus and insertion hand gently removed from vagina.    Strings trimmed to the level of the introitus. Patient tolerated procedure well.  Patient given post procedure instructions and IUD care card with expiration date.  Patient is asked to keep IUD strings tucked in her vagina until her postpartum follow up visit in 4-6 weeks. Patient advised to abstain from sexual intercourse and pulling on strings before her follow-up visit. Patient verbalized an understanding of the plan of care and agrees.   Pt added to the post-placental IUD insertion list and charges entered.  LOT: [id number] EXP: 07/2024  Myrtis Ser CNM 09/03/2021 5:34 AM

## 2021-09-03 NOTE — Lactation Note (Signed)
This note was copied from a baby's chart. Lactation Consultation Note  Patient Name: [name] [id number] Date: 09/03/2021 Reason for consult: Follow-up assessment;Mother's request;Difficult latch;Breastfeeding assistance;RN request;Term Age:24 hours  RN request to assist with latching, extended period of time without being able to get infant to latch. LC gave some colostrum on the finger and latched in football with bed roll to support the breast. Infant showing signs of milk transfer. Mom has uterine cramping in midst of the latch.   Hand pump provided for Mom to use or hand expression to give colostrum to initiate a suck to get infant to latch.  All questions answered at the end of the visit.   Maternal Data Has patient been taught Hand Expression?: Yes  Feeding Mother's Current Feeding Choice: Breast Milk  LATCH Score Latch: Repeated attempts needed to sustain latch, nipple held in mouth throughout feeding, stimulation needed to elicit sucking reflex.  Audible Swallowing: Spontaneous and intermittent  Type of Nipple: Everted at rest and after stimulation  Comfort (Breast/Nipple): Soft / non-tender  Hold (Positioning): Assistance needed to correctly position infant at breast and maintain latch.  LATCH Score: 8   Lactation Tools Discussed/Used Tools: Pump;Flanges Flange Size: 27 Breast pump type: Manual (Mom provided with hand pump for extra stimulation) Pump Education: Setup, frequency, and cleaning;Milk Storage Reason for Pumping: increase stimulation Pumping frequency: every 3 hrs for 10 min each breast  Interventions Interventions: Breast feeding basics reviewed;Education;Support pillows;Assisted with latch;Position options;Skin to skin;Expressed milk;Breast massage;Infant Driven Feeding Algorithm education;Hand express;Hand pump;Breast compression;Adjust position  Discharge    Consult Status Consult Status: Follow-up Date: 09/04/21 Follow-up type:  In-patient    Janiya Millirons  Nicholson-Springer 09/03/2021, 10:43 PM

## 2021-09-03 NOTE — Lactation Note (Signed)
This note was copied from a baby's chart. Lactation Consultation Note  Patient Name: Leah Kennedy [id number] Date: 09/03/2021 Reason for consult: Term;Initial assessment Age:24 hours   P3 mother whose infant is now 60 hours old.  This is a term baby at 39+0 weeks.  Mother breast fed her first child (now 14 years old) for 5 months.  She did not breast feed her second child (now 8 months old).  Mother's current feeding preference is breast/formula.  Mother was eating breakfast and on the phone when I arrived.  She asked if baby could get some formula.  I informed her that formula can be provided at her request, however, I would be happy to assist with latching.  Mother receptive.  She desired to continue eating and talking on the phone during the consult; not much education completed at this time.  Baby easily latched in the football hold to the left breast.  Observed her feeding for 5 minutes prior to exiting the room.  Allowed time for mother to continue face timing with her 72 year old.  She may call as needed for further assistance.  Minimal education completed due to the activity going on during the consult.  I will return as needed for further assistance.   Maternal Data Has patient been taught Hand Expression?:  (Unsure; mother on the phone during the consult) Does the patient have breastfeeding experience prior to this delivery?: Yes How long did the patient breastfeed?: 5 months with her first child  Feeding Mother's Current Feeding Choice: Breast Milk and Formula  LATCH Score Latch: Grasps breast easily, tongue down, lips flanged, rhythmical sucking.  Audible Swallowing: None  Type of Nipple: Everted at rest and after stimulation  Comfort (Breast/Nipple): Soft / non-tender  Hold (Positioning): Assistance needed to correctly position infant at breast and maintain latch.  LATCH Score: 7   Lactation Tools Discussed/Used    Interventions Interventions: Assisted with  latch;Skin to skin;Breast compression;Adjust position;Support pillows;Position options;Breast feeding basics reviewed;Education;LC Services brochure  Discharge    Consult Status Consult Status: Follow-up Date: 09/04/21 Follow-up type: In-patient    Dora Sims 09/03/2021, 11:37 AM

## 2021-09-03 NOTE — Plan of Care (Signed)
Problem: Education: °Goal: Knowledge of Childbirth will improve °Outcome: Completed/Met °Goal: Ability to make informed decisions regarding treatment and plan of care will improve °Outcome: Completed/Met °Goal: Ability to state and carry out methods to decrease the pain will improve °Outcome: Completed/Met °Goal: Individualized Educational Video(s) °Outcome: Completed/Met °  °Problem: Coping: °Goal: Ability to verbalize concerns and feelings about labor and delivery will improve °Outcome: Completed/Met °  °Problem: Life Cycle: °Goal: Ability to make normal progression through stages of labor will improve °Outcome: Completed/Met °Goal: Ability to effectively push during vaginal delivery will improve °Outcome: Completed/Met °  °Problem: Role Relationship: °Goal: Will demonstrate positive interactions with the child °Outcome: Completed/Met °  °Problem: Safety: °Goal: Risk of complications during labor and delivery will decrease °Outcome: Completed/Met °  °Problem: Pain Management: °Goal: Relief or control of pain from uterine contractions will improve °Outcome: Completed/Met °  °

## 2021-09-03 NOTE — Discharge Summary (Signed)
Postpartum Discharge Summary  Date of Service updated     Patient Name: Leah Kennedy DOB: Mar 18, 1996 MRN: [id number]  Date of admission: 09/02/2021 Delivery date:09/03/2021  Delivering provider: Serita Grammes D  Date of discharge: 09/05/2021  Admitting diagnosis: Pregnancy [Z34.90] Intrauterine pregnancy: [redacted]w[redacted]d    Secondary diagnosis:  Active Problems:   Short interval between pregnancies affecting pregnancy, antepartum   Depression with anxiety   Positive testing for group B Streptococcus  Additional problems: none    Discharge diagnosis: Term Pregnancy Delivered                                              Post partum procedures: postplacental Liletta placed Augmentation:  none Complications: None  Hospital course: Onset of Labor With Vaginal Delivery      26y.o. yo [id number] [id number] admitted in Latent Labor on 09/02/2021. Patient had an uncomplicated labor course followed by a postplacental Liletta placement per her request.  Membrane Rupture Time/Date: 4:58 AM ,09/03/2021   Delivery Method:Vaginal, Spontaneous  Episiotomy: None  Lacerations:  None  Patient had an uncomplicated postpartum course.  She is ambulating, tolerating a regular diet, passing flatus, and urinating well. Patient is discharged home in stable condition on 09/05/21.  Newborn Data: Birth date:09/03/2021  Birth time:5:04 AM  Gender:Female  Living status:Living  Apgars:8 ,9  Weight:3232 g (7lb 2oz)  Magnesium Sulfate received: No BMZ received: No Rhophylac:N/A MMR:N/A T-DaP:Given prenatally Flu: Yes Transfusion:No  Physical exam  Vitals:   09/04/21 0615 09/04/21 1527 09/04/21 1949 09/05/21 0546  BP: (!) 112/56 (!) 95/58 (!) 109/54 (!) 102/49  Pulse: 63 74 77 70  Resp: _0 Temp: 98.2 F (36.8 C) 98.4 F (36.9 C) 98.5 F (36.9 C) 98.5 F (36.9 C)  TempSrc: Oral  Oral Oral  SpO2: 100% 99% 100% 99%  Weight:      Height:       General: alert, cooperative, and no  distress Lochia: appropriate Uterine Fundus: firm Incision: N/A DVT Evaluation: Calf/Ankle edema is present (pedal edema and 1+ edema to lower shin bilaterally)  Labs: Lab Results  Component Value Date   WBC 12.9 (H) 09/03/2021   HGB 10.7 (L) 09/03/2021   HCT 34.2 (L) 09/03/2021   MCV 83.6 09/03/2021   PLT 299 09/03/2021   CMP Latest Ref Rng & Units 07/09/2021  Glucose 70 - 99 mg/dL 86  BUN 6 - 20 mg/dL <5(L)  Creatinine 0.44 - 1.00 mg/dL 0.69  Sodium 135 - 145 mmol/L 137  Potassium 3.5 - 5.1 mmol/L 3.9  Chloride 98 - 111 mmol/L 111  CO2 22 - 32 mmol/L 22  Calcium 8.9 - 10.3 mg/dL 7.9(L)  Total Protein 6.5 - 8.1 g/dL -  Total Bilirubin 0.3 - 1.2 mg/dL -  Alkaline Phos 38 - 126 U/L -  AST 15 - 41 U/L -  ALT 0 - 44 U/L -   Edinburgh Score: Edinburgh Postnatal Depression Scale Screening Tool 09/04/2021  I have been able to laugh and see the funny side of things. 0  I have looked forward with enjoyment to things. 0  I have blamed myself unnecessarily when things went wrong. 2  I have been anxious or worried for no good reason. 2  I have felt scared or panicky for no good reason. 2  Things have been getting  on top of me. 2  I have been so unhappy that I have had difficulty sleeping. 2  I have felt sad or miserable. 1  I have been so unhappy that I have been crying. 1  The thought of harming myself has occurred to me. 1  Edinburgh Postnatal Depression Scale Total 13     After visit meds:  Allergies as of 09/05/2021       Reactions   Carrot [daucus [place]] Itching   Kiwi Extract Itching        Medication List     STOP taking these medications    cyclobenzaprine 10 MG tablet Commonly known as: FLEXERIL   QUEtiapine 25 MG tablet Commonly known as: SEROQUEL   sertraline 25 MG tablet Commonly known as: ZOLOFT       TAKE these medications    acetaminophen 325 MG tablet Commonly known as: Tylenol Take 3 tablets (975 mg total) by mouth every 6 (six) hours  as needed (for pain scale < 4).   ferrous sulfate 325 (65 FE) MG tablet Take 1 tablet (325 mg total) by mouth every other day.   HAIR/SKIN/NAILS PO Take by mouth.   ibuprofen 600 MG tablet Commonly known as: ADVIL Take 1 tablet (600 mg total) by mouth every 6 (six) hours as needed.   PRENATAL GUMMIES PO Take 0.5 tablets by mouth 2 (two) times daily.   thiamine 100 MG tablet Commonly known as: Vitamin B-1 Take 100 mg by mouth daily.   vitamin C 500 MG tablet Commonly known as: ASCORBIC ACID Take 500 mg by mouth daily.         Discharge home in stable condition Infant Feeding: Breast Infant Disposition:home with mother Discharge instruction: per After Visit Summary and Postpartum booklet. Activity: Advance as tolerated. Pelvic rest for 6 weeks.  Diet: routine diet Future Appointments: Future Appointments  Date Time Provider [place]  09/07/2021  1:50 PM Myrtis Ser, CNM [facility]   Follow up Visit: Myrtis Ser, CNM  [place] Please schedule this patient for Postpartum visit in: 6 weeks with the following provider: Any provider  In-Person  For C/S patients schedule nurse incision check in weeks 2 weeks: no  Low risk pregnancy complicated by: none  Delivery mode:  SVD  Anticipated Birth Control:  PP IUD Placed  PP Procedures needed: IUD string trim  Schedule Integrated [place] visit: yes (@ 2wks PP)    09/05/2021 Patriciaann Clan, DO

## 2021-09-03 NOTE — Anesthesia Procedure Notes (Signed)
Epidural Patient location during procedure: OB Start time: 09/03/2021 4:24 AM End time: 09/03/2021 4:38 AM  Staffing Anesthesiologist: Barnet Glasgow, MD Performed: anesthesiologist   Preanesthetic Checklist Completed: patient identified, IV checked, site marked, risks and benefits discussed, surgical consent, monitors and equipment checked, pre-op evaluation and timeout performed  Epidural Patient position: sitting Prep: DuraPrep and site prepped and draped Patient monitoring: continuous pulse ox and blood pressure Approach: midline Location: L3-L4 Injection technique: LOR air  Needle:  Needle type: Tuohy  Needle gauge: 17 G Needle length: 9 cm and 9 Needle insertion depth: 8 cm Catheter type: closed end flexible Catheter size: 19 Gauge Catheter at skin depth: 14 cm Test dose: negative  Assessment Events: blood not aspirated, injection not painful, no injection resistance, no paresthesia and negative IV test  Additional Notes Patient identified. Risks/Benefits/Options discussed with patient including but not limited to bleeding, infection, nerve damage, paralysis, failed block, incomplete pain control, headache, blood pressure changes, nausea, vomiting, reactions to medication both or allergic, itching and postpartum back pain. Confirmed with bedside nurse the patient's most recent platelet count. Confirmed with patient that they are not currently taking any anticoagulation, have any bleeding history or any family history of bleeding disorders. Patient expressed understanding and wished to proceed. All questions were answered. Sterile technique was used throughout the entire procedure. Please see nursing notes for vital signs. Test dose was given through epidural needle and negative prior to continuing to dose epidural or start infusion. Warning signs of high block given to the patient including shortness of breath, tingling/numbness in hands, complete motor block, or any  concerning symptoms with instructions to call for help. Patient was given instructions on fall risk and not to get out of bed. All questions and concerns addressed with instructions to call with any issues.  1 Attempt (S) . Patient tolerated procedure well.

## 2021-09-03 NOTE — Lactation Note (Signed)
This note was copied from a baby's chart. Lactation Consultation Note Assisted mom in latching to great everted nipples. Baby latched w/o difficulty. Baby was screaming when LC came into rm. Stopped when she got on the breast. Mom BF her now 24 yr old for 5 months and tried to BF her now 67 month old but he was having stomach issues and latching issues so she didn't BF him but just a few weeks at the most.  Patient Name: Leah Kennedy [id number] Date: 09/03/2021 Reason for consult: L&D Initial assessment;Term Age:50 hours  Maternal Data Does the patient have breastfeeding experience prior to this delivery?: Yes How long did the patient breastfeed?: 5 months to her 24 yr old. didn't BF her 59 month old. latching issues, stomach issues  Feeding    LATCH Score Latch: Grasps breast easily, tongue down, lips flanged, rhythmical sucking.  Audible Swallowing: A few with stimulation  Type of Nipple: Everted at rest and after stimulation  Comfort (Breast/Nipple): Soft / non-tender  Hold (Positioning): Assistance needed to correctly position infant at breast and maintain latch.  LATCH Score: 8   Lactation Tools Discussed/Used    Interventions Interventions: Breast feeding basics reviewed;Adjust position;Assisted with latch;Support pillows;Skin to skin;Breast compression  Discharge    Consult Status Consult Status: Follow-up from L&D Date: 09/03/21 Follow-up type: In-patient    Charyl Dancer 09/03/2021, 6:17 AM

## 2021-09-03 NOTE — H&P (Addendum)
OBSTETRIC ADMISSION HISTORY AND PHYSICAL  Leah Kennedy is a 26 y.o. female [phone] with IUP at 38w0dby LMP presenting for SOL. Per MAU note, she has had ctx all day which got closer together around 2030. She reports somewhat decreased FMs today, No LOF, no VB.  She plans on breast feeding. She request postplacental liletta for birth control. [facility]  Dating: By LMP --->  Estimated Date of Delivery: 09/10/21  Sono:    '@[redacted]w[redacted]d' , CWD, normal anatomy, cephalic presentation, posterior placenta, 675g, 38% EFW   Prenatal History/Complications:  -Depression and anxiety; hospitalized from 07/08/21-07/11/21 d/t suicidal attempt in third trimester, started on zoloft and seroquel (not taking at time of admission) -Chlamydia infection at 162w3dtreated -GBS positive -maternal obesity    Past Medical History: Past Medical History:  Diagnosis Date   Chlamydia 07/15/2018   tx 07/15/18 POC -/-      +11/19/19  tx 11/24/19, POC 04/21/20: +, tx 04/27/20  POC + 06/07/20, POC neg 09/10/20     +03/29/21  POC 06/20/21  -/-   Depression    Depression with anxiety 03/01/2021   No meds, declines IBH referral   Marijuana use 04/27/2020   [place] [facility]    [place] [facility]     Repeat___    Morbid obesity ([facility]   Prediabetes    Previous known suicide attempt as a teenage 07/08/2021    Past Surgical History: Past Surgical History:  Procedure Laterality Date   TONSILLECTOMY  2017   TOOTH EXTRACTION Right 01/2021   WISDOM TOOTH EXTRACTION Bilateral 2020    Obstetrical History: OB History     Gravida  4   Para  2   Term  2   Preterm      AB  1   Living  2      SAB  1   IAB      Ectopic      Multiple  0   Live Births  2           Social History Social History   Socioeconomic History   Marital status: Single    Spouse name: ReRozell Searing Number of children: 2   Years of education: 12   Highest education  level: GED or equivalent  Occupational History   Not on file  Tobacco Use   Smoking status: Former   Smokeless tobacco: Never  VaScientific laboratory technicianse: Never used  Substance and Sexual Activity   Alcohol use: Not Currently   Drug use: Not Currently    Types: Marijuana    Comment: before pregnancy   Sexual activity: Not Currently    Birth control/protection: None  Other Topics Concern   Not on file  Social History Narrative   Not on file   Social Determinants of Health   Financial Resource Strain: Low Risk    Difficulty of Paying Living Expenses: Not hard at all  Food Insecurity: No Food Insecurity   Worried About RuCharity fundraisern the Last Year: Never true   Ran Out of Food in the Last Year: Never true  Transportation Needs: No Transportation Needs   Lack of Transportation (Medical): No   Lack of Transportation (Non-Medical): No  Physical Activity: Sufficiently Active   Days of Exercise per Week: 3 days   Minutes of Exercise per Session: 60 min  Stress: Stress Concern Present   Feeling of Stress :  Very much  Social Connections: Moderately Isolated   Frequency of Communication with Friends and Family: More than three times a week   Frequency of Social Gatherings with Friends and Family: More than three times a week   Attends Religious Services: Never   Marine scientist or Organizations: No   Attends Music therapist: Never   Marital Status: Living with partner    Family History: Family History  Problem Relation Age of Onset   Stroke Father    Alcohol abuse Father    Pulmonary disease Maternal Grandmother    Drug abuse Maternal Grandmother    Alcohol abuse Maternal Grandfather    Heart murmur Paternal Grandmother    Hypertension Paternal Grandmother    Irritable bowel syndrome Paternal Grandmother    Glaucoma Paternal Grandmother    Hypertension Paternal Grandfather    Diabetes Paternal Grandfather    Gout Paternal Grandfather    Kidney  disease Paternal Grandfather    Glaucoma Paternal Grandfather     Allergies: Allergies  Allergen Reactions   Carrot [Daucus Carota] Itching   Kiwi Extract Itching    Medications Prior to Admission  Medication Sig Dispense Refill Last Dose   Biotin w/ Vitamins C & E (HAIR/SKIN/NAILS PO) Take by mouth.   09/03/2021   cyclobenzaprine (FLEXERIL) 10 MG tablet Take 1 tablet (10 mg total) by mouth every 8 (eight) hours as needed for muscle spasms. 30 tablet 1 09/02/2021   Prenatal MV & Min w/FA-DHA (PRENATAL GUMMIES PO) Take 0.5 tablets by mouth 2 (two) times daily.   09/03/2021   thiamine (VITAMIN B-1) 100 MG tablet Take 100 mg by mouth daily.   09/03/2021   vitamin C (ASCORBIC ACID) 500 MG tablet Take 500 mg by mouth daily.   09/03/2021   aspirin 81 MG EC tablet Take 1 tablet (81 mg total) by mouth daily. Swallow whole. (Patient not taking: Reported on 08/24/2021) 90 tablet 3    ferrous sulfate 325 (65 FE) MG tablet Take 1 tablet (325 mg total) by mouth every other day. (Patient not taking: Reported on 08/24/2021) 45 tablet 2    QUEtiapine (SEROQUEL) 25 MG tablet Take 1 tablet (25 mg total) by mouth at bedtime. (Patient not taking: Reported on 08/31/2021) 30 tablet 3    sertraline (ZOLOFT) 25 MG tablet Take 1 tablet (25 mg total) by mouth at bedtime. (Patient not taking: Reported on 08/24/2021) 30 tablet 3      Review of Systems   All systems reviewed and negative except as stated in HPI  Blood pressure 125/78, pulse (!) 105, temperature 98.3 F (36.8 C), temperature source Oral, height '5\' 7"'  (1.702 m), weight 133.4 kg, last menstrual period 12/04/2020, SpO2 99 %, not currently breastfeeding. General appearance: alert, no distress, and morbidly obese Lungs: clear to auscultation bilaterally Heart: regular rate and rhythm Abdomen: soft, non-tender; bowel sounds normal Extremities:no swelling or tenderness of BLEs Presentation: cephalic Fetal monitoringBaseline: 130 bpm, Variability: Good {> 6 bpm),  Accelerations: Reactive, and Decelerations: Absent Uterine activity ctx q 2 minutes Dilation: 5.5 Effacement (%): 50 Station: -2 Exam by:: [employer], RN   Prenatal labs: ABO, Rh: --/--/AB POS (12/18 0827) Antibody: NEG (12/18 0827) Rubella: 22.80 (08/09 1034) RPR: Non Reactive (11/28 0840)  HBsAg: Negative (08/09 1034)  HIV: Non Reactive (11/28 0840)  GBS: Positive/-- (01/25 1200)  2 hr Glucola normal Genetic screening  normal Anatomy US normal  Prenatal Transfer Tool  Maternal Diabetes: No Genetic Screening: Normal Maternal Ultrasounds/Referrals: Normal  Fetal Ultrasounds or other Referrals:  None Maternal Substance Abuse:  No Significant Maternal Medications:  Meds include: Zoloft Other: seroquel Significant Maternal Lab Results: Group B Strep positive  No results found for this or any previous visit (from the past 24 hour(s)).  Patient Active Problem List   Diagnosis Date Noted   Positive testing for group B Streptococcus 08/20/2021   Suicide attempt by drug ingestion ([place]) 07/08/2021   Previous known suicide attempt as a teenager 07/08/2021   Hypokalemia 07/08/2021   Depression with anxiety 03/01/2021   Short interval between pregnancies affecting pregnancy, antepartum 02/24/2021   Supervision of low-risk pregnancy 02/24/2021   Rubella non-immune status, antepartum 05/21/2020   Marijuana use 04/27/2020   Morbid obesity ([place]) 08/08/2018   Chlamydia 07/15/2018    Assessment/Plan:  Denine Brotz is a 26 y.o. [id number] at 35w0dhere for SOL.  #Labor:Cervix is favorable and making change. Contractions are regular. Continue with expectant management  #Pain: PRN, planning for water birth #FWB: Cat 1 #ID:  GBS positive > ampicillin #MOF: breast #MOC:postplacental lGermaine Pomfret DO  09/03/2021, 2:27 AM  CNM attestation:  I have seen and examined this patient; I agree with above documentation in the resident's note.   ATrany Chernickis a 26 y.o. [phone] for SOL  PE: BP (!) 109/55    Pulse 84    Temp (!) 97.5 F (36.4 C) (Axillary)    Ht '5\' 7"'  (1.702 m)    Wt 133.4 kg    LMP 12/04/2020    SpO2 99%    BMI 46.08 kg/m  Gen: breathing w ctx Resp: normal effort, no distress Abd: gravid  ROS, labs, PMH reviewed  Plan: Admit to L&D Initially planning a waterbirth but not has epidural placed Has received Amp x 1 dose for GBS+ Anticipate vag del Consent signed for postplacental Liletta placement Rec MMR vax pp  KMyrtis SerCNM 09/03/2021, 4:54 AM

## 2021-09-03 NOTE — Anesthesia Preprocedure Evaluation (Signed)
Anesthesia Evaluation  Patient identified by MRN, date of birth, ID band Patient awake    Reviewed: Allergy & Precautions, NPO status , Patient's Chart, lab work & pertinent test results  Airway Mallampati: III  TM Distance: >3 FB Neck ROM: Full    Dental no notable dental hx. (+) Teeth Intact, Dental Advisory Given   Pulmonary former smoker,    Pulmonary exam normal breath sounds clear to auscultation       Cardiovascular Exercise Tolerance: Good negative cardio ROS Normal cardiovascular exam Rhythm:Regular Rate:Normal     Neuro/Psych PSYCHIATRIC DISORDERS Depression    GI/Hepatic   Endo/Other    Renal/GU      Musculoskeletal   Abdominal (+) + obese (BMI 46.1),   Peds  Hematology  Lab Results      Component                Value               Date                 HGB                      10.7 (L)            09/03/2021                HCT                      34.2 (L)            09/03/2021                 PLT                      299                 09/03/2021              Anesthesia Other Findings   Reproductive/Obstetrics (+) Pregnancy                             Anesthesia Physical Anesthesia Plan  ASA: 3  Anesthesia Plan: Epidural   Post-op Pain Management:    Induction:   PONV Risk Score and Plan:   Airway Management Planned:   Additional Equipment:   Intra-op Plan:   Post-operative Plan:   Informed Consent:   Plan Discussed with:   Anesthesia Plan Comments:         Anesthesia Quick Evaluation

## 2021-09-04 NOTE — Lactation Note (Signed)
This note was copied from a baby's chart. Lactation Consultation Note Mom states she is wanting to leaving in the morning. Mom is breast/formula feeding baby. Mom states she isn't having any issues. Her nipples are sore where the baby is wanting to BF a lot. Mom is offering breast before giving formula. Mom has no questions. Mom states she knows about engorgement and management. Reminded about support groups and LC services if needed.  Patient Name: Leah Kennedy [id number] Date: 09/04/2021 Reason for consult: Follow-up assessment Age:38 hours  Maternal Data    Feeding Nipple Type: Nfant Slow Flow (purple)  LATCH Score                    Lactation Tools Discussed/Used    Interventions    Discharge    Consult Status Consult Status: Complete Date: 09/04/21    Charyl Dancer 09/04/2021, 9:49 PM

## 2021-09-04 NOTE — Progress Notes (Signed)
POSTPARTUM PROGRESS NOTE  Post Partum Day 1  Subjective:  Leah Kennedy is a 26 y.o. [id number] s/p SVD at [redacted]w[redacted]d.  She reports she is doing well. No acute events overnight. She denies any problems with ambulating, voiding or po intake. Denies nausea or vomiting.  Pain is well controlled.  Lochia is mild.  Objective: Blood pressure (!) 112/56, pulse 63, temperature 98.2 F (36.8 C), temperature source Oral, resp. rate 16, height 5\' 7"  (1.702 m), weight 133.4 kg, last menstrual period 12/04/2020, SpO2 100 %, unknown if currently breastfeeding.  Physical Exam:  General: alert, cooperative and no distress Chest: no respiratory distress Heart:regular rate, distal pulses intact Uterine Fundus: firm, appropriately tender DVT Evaluation: No calf swelling or tenderness Extremities: pedal edema bilaterally  Skin: warm, dry  Recent Labs    09/03/21 0228  HGB 10.7*  HCT 34.2*    Assessment/Plan: Leah Kennedy is a 26 y.o. 30 s/p SVD at [redacted]w[redacted]d   PPD#1 - Doing well  Routine postpartum care  Contraception: IUD placed Feeding: Both    Dispo: Patient would like to leave today. Discussed inadequate GBS coverage and that pediatrics would likely want to observe her daughter for 48 hours. Anticipate staying through today, however if this changes from a pediatric standpoint, mom is stable for discharge home.    LOS: 1 day   [redacted]w[redacted]d, DO  OB Fellow  09/04/2021, 7:58 AM

## 2021-09-04 NOTE — Anesthesia Postprocedure Evaluation (Signed)
Anesthesia Post Note  Patient: Leah Kennedy  Procedure(s) Performed: AN AD HOC LABOR EPIDURAL     Patient location during evaluation: Mother Baby Anesthesia Type: Epidural Level of consciousness: awake Pain management: satisfactory to patient Vital Signs Assessment: post-procedure vital signs reviewed and stable Respiratory status: spontaneous breathing Cardiovascular status: stable Anesthetic complications: no   No notable events documented.  Last Vitals:  Vitals:   09/03/21 2207 09/04/21 0615  BP: 111/67 (!) 112/56  Pulse: 72 63  Resp: 18 16  Temp: 36.9 C 36.8 C  SpO2: 100% 100%    Last Pain:  Vitals:   09/04/21 0900  TempSrc:   PainSc: 0-No pain   Pain Goal:                   [employer]

## 2021-09-04 NOTE — Clinical Social Work Maternal (Signed)
°CLINICAL SOCIAL WORK MATERNAL/CHILD NOTE ° °Patient Details  °Name: Leah Kennedy °MRN: [id number] °Date of Birth: 06/05/1996 ° °Date:  09/04/2021 ° °Clinical Social Worker Initiating Note:  Kairav Russomanno, LCSWA Date/Time: Initiated:  09/04/21/0945    ° °Child's Name:  Leah Kennedy  ° °Biological Parents:  Mother, Father (Leah Kennedy 10/06/1995)  ° °Need for Interpreter:  None  ° °Reason for Referral:  Behavioral Health Concerns  ° °Address:  [address] °[place] [address]  °  °Phone number:  [phone] (home)    ° °Additional phone number:  ° °Household Members/Support Persons (HM/SP):   Household Member/Support Person 1, Household Member/Support Person 2, Household Member/Support Person 3 ° ° °HM/SP Name Relationship DOB or Age  °HM/SP -1 Leah Kennedy FOB 10/06/1995  °HM/SP -2 Leah Kennedy Son 10/18/2020  °HM/SP -3 Leah Kennedy Son 01/17/2019  °HM/SP -4        °HM/SP -5        °HM/SP -6        °HM/SP -7        °HM/SP -8        ° ° °Natural Supports (not living in the home):  Immediate Family  ° °Professional Supports: Therapist  ° °Employment: Full-time  ° °Type of Work: [employer]  ° °Education:  High school graduate  ° °Homebound arranged:   ° °Financial Resources:  Medicaid  ° °Other Resources:  WIC, Food Stamps    ° °Cultural/Religious Considerations Which May Impact Care:   ° °Strengths:  Ability to meet basic needs  , Home prepared for child  , Pediatrician chosen  ° °Psychotropic Medications:        ° °Pediatrician:    [place] ° °Pediatrician List:  ° °[place]    °[place]    °[place]    °[place] Other ([facility])  °[place]    °[place]    ° ° °Pediatrician Fax Number:   ° °Risk Factors/Current Problems:  None  ° °Cognitive State:  Alert  , Insightful  , Linear Thinking    ° °Mood/Affect:  Bright  , Interested  , Comfortable  , Happy    ° °CSW Assessment: CSW consulted for "hospitalized for suicide attempt third trimester this  pregnancy 12/16-12/19/2022, teen hx of suicide attempt, positive for THC use this pregnancy." CSW met with MOB to assess and provide support. Upon entering the room, CSW observed MOB and infant sleep in bed together. CSW introduced self, role and reviewed SIDS education with MOB. CSW informed MOB of reason for consult. MOB was easily engaged, pleasant and open with CSW. MOB reported she is currently doing fine, however the pregnancy was rough. MOB disclosed she was experiencing general depression, then also experienced postpartum.  MOB acknowledged being hospitalized in December for her mental health and stated "it was something I needed." MOB stated she realized she can not manage depression and postpartum depression together. MOB reported that looking back she realizes she was experiencing PPD a couple of months after the birth of Leah in March 2022, however she did not realize it was a problem until August or September 2022. MOB stated she had spells of crying and felt lonely during this pregnancy, although she was not physically alone. MOB reported she was also unintentionally not eating. MOB stated that with regular depression, it was manageable. MOB reported she is now talking to a therapist (Leslie) who is also a family friend and it has been very helpful in coping.   MOB stated she was prescribed Seroquel and Zoloft in December, however she has not had been able to refill the prescription due to being too busy. MOB reported she felt more calm with medication, however she does not feel she needs medication at this time due to the talk therapy being so helpful. MOB stated she holds a lot inside and she feels therapy allows her to share without judgment. MOB reported she still has access to obtain prescription medication if needed and she has discussed it with her therapist. MOB shared that when she turned 20 she discontinued prescribed medications, due to feeling overmedicated. CSW asked MOB if she has  identified additional coping mechanisms. MOB stated cleaning and de-cluttering is helpful. MOB reported she is moving in a few weeks and she is excited to be in a new space. CSW inquired on MOB current feelings. MOB reported she is doing well. MOB stated she is bonding well with infant. MOB reported she has not experienced any SI since being hospitalized in December. MOB agrees to notify a professional if mental health again becomes unmanageable. MOB identified her grandmother and FOB as supports. MOB denies any current SI, HI or being involved in DV. MOB engaged in a lot of conversation with CSW about her other children and feelings surrounding her mental health. MOB agreed to notify a professional if needs arise postpartum and was receptive to mental health resources provided. MOB appropriately interacted with infant and did not display any acute mental health needs.  ° °MOB lives with her children and stated FOB is present at times. MOB stated FOB is currently out of town. MOB is employed full time, receiving WIC and food stamp benefits.  °CSW inquired on MOB substance use. MOB reported she smoked THC at the very beginning do to nausea. MOB stated it was only a couple of times and that she told the doctor each time. CSW informed MOB of the hospital drug screen policy. MOB aware infant UDS is negative the CDS will be followed. MOB aware a CPS report will be made if infant test positive. MOB was understanding and denies previous CPS history.  ° °CSW provided education regarding the baby blues period versus PPD and provided resources. CSW provided the New Mom Checklist and encouraged MOB to self evaluate. °CSW provided review of Sudden Infant Death Syndrome (SIDS) precautions.  MOB reported infant will sleep in a bedside bassinet. MOB has all infant essentials and denies barriers to infant follow-up care. MOB denies any additional needs at this time.  ° °CSW will continue to follow CDS and make a  CPS report if  warranted. CSW will make referral for [facility]. CSW identifies no further need for intervention and no barriers to discharge at this time. ° °CSW Plan/Description:  CSW Will Continue to Monitor Umbilical Cord Tissue Drug Screen Results and Make Report if Warranted, Child Protective Service Report  , Hospital Drug Screen Policy Information, Perinatal Mood and Anxiety Disorder (PMADs) Education, Sudden Infant Death Syndrome (SIDS) Education, No Further Intervention Required/No Barriers to Discharge, Other Information/Referral to Community Resources, Other Patient/Family Education  ° ° °Fantasha Daniele J Versia Mignogna, LCSWA °09/04/2021, 11:40 AM °

## 2021-09-05 MED ORDER — ACETAMINOPHEN 325 MG PO TABS: 1000.0000 mg | ORAL_TABLET | Freq: Four times a day (QID) | ORAL | Status: DC | PRN

## 2021-09-05 MED ORDER — IBUPROFEN 600 MG PO TABS: 600.0000 mg | ORAL_TABLET | Freq: Four times a day (QID) | ORAL | 0 refills | Status: DC | PRN

## 2021-09-05 NOTE — Discharge Instructions (Signed)
Congratulations!   -Take tylenol 1000 mg every 6 hours as needed for pain, alternate with ibuprofen 600 mg every 6 hours -Drink plenty of water to help with breastfeeding -Continue prenatal vitamins while you are breastfeeding -Take iron pills every other day with vitamin c, this will help healing as well as breast feeding (be mindful that this can cause some constipation and darker stools- hydration and miralax if needed can help you stay regular)  - Make sure if you haven't scheduled your postpartum visit, to schedule to be seen around 4-6 weeks or sooner if needed

## 2021-09-07 ENCOUNTER — Encounter: Payer: Medicaid Other | Admitting: Advanced Practice Midwife

## 2021-09-07 ENCOUNTER — Telehealth: Payer: Self-pay

## 2021-09-07 ENCOUNTER — Encounter: Payer: Self-pay | Admitting: *Deleted

## 2021-09-07 NOTE — Telephone Encounter (Signed)
Transition Care Management Unsuccessful Follow-up Telephone Call  Date of discharge and from where:  09/06/2021-[facility]  Attempts:  1st Attempt  Reason for unsuccessful TCM follow-up call:  Left voice message

## 2021-09-08 ENCOUNTER — Ambulatory Visit: Payer: Medicaid Other | Admitting: Obstetrics & Gynecology

## 2021-09-08 ENCOUNTER — Encounter: Payer: Self-pay | Admitting: *Deleted

## 2021-09-08 NOTE — Telephone Encounter (Signed)
Transition Care Management Unsuccessful Follow-up Telephone Call  Date of discharge and from where:  09/06/2021-[facility]  Attempts:  2nd Attempt  Reason for unsuccessful TCM follow-up call:  Left voice message

## 2021-09-09 NOTE — Telephone Encounter (Signed)
Transition Care Management Unsuccessful Follow-up Telephone Call  Date of discharge and from where:  09/06/2021-[facility]   Attempts:  3rd Attempt  Reason for unsuccessful TCM follow-up call:  Left voice message

## 2021-09-13 ENCOUNTER — Telehealth (HOSPITAL_COMMUNITY): Payer: Self-pay

## 2021-09-13 DIAGNOSIS — Z1331 Encounter for screening for depression: Secondary | ICD-10-CM

## 2021-09-13 NOTE — Telephone Encounter (Signed)
No answer. Left message to return nurse call.  Hospital EPDS score on 09/04/21 is 13. Referral placed for integrated behavioral health.   Sharyn Lull [facility] 09/13/2021,1413

## 2021-10-05 ENCOUNTER — Other Ambulatory Visit: Payer: Self-pay

## 2021-10-05 ENCOUNTER — Encounter: Payer: Self-pay | Admitting: Women's Health

## 2021-10-05 ENCOUNTER — Ambulatory Visit (INDEPENDENT_AMBULATORY_CARE_PROVIDER_SITE_OTHER): Payer: Medicaid Other | Admitting: Women's Health

## 2021-10-05 DIAGNOSIS — F418 Other specified anxiety disorders: Secondary | ICD-10-CM

## 2021-10-05 MED ORDER — SERTRALINE HCL 25 MG PO TABS: 25.0000 mg | ORAL_TABLET | Freq: Every day | ORAL | 3 refills | Status: DC

## 2021-10-05 NOTE — Patient Instructions (Addendum)
Primary Care Providers Dr. Zack Hall ([place]) [phone] [facility] [phone] [facility] ([place]) [phone] [facility] ([place]) [phone] [facility] (Camp[facility]) [phone] [facility] ([place]) [phone] [facility] [place] [phone] [facility] [phone]   [facility]  [address] [place], [place] [address] Phone: [phone] Hours: Mon-Fri 8AM-5PM  If you're a walk-in patient there is generally a wait time involved on a "first come, first served basis" otherwise contact us beforehand to setup an appointment. If you're in crisis please use our 24-Hour Crisis Hotline for immediate access to a clinician. 24 Hour Crisis Line: [phone] [phone]   If this is your first time please bring the following with you: Insurance/Medicaid Card ID or Social Security Card Any referral documentation  Payment Options Individuals with Medicaid have no obligation for a copay. Individuals with Medicare or private insurance will be obligated to meet their policy's requirement(s). Individuals who are uninsured will be eligible for a sliding or discounted scaled as defined by the relevant Managed Care Organization/Local Management Entity  Services:  Substance Abuse Outpatient Treatment Mental Health Outpatient Treatment Psychiatric Services/ Medical Services Advanced Access/Walk-In Clinic Mobile Crisis Management Services ACTT (Assertive Community Treatment Team) Dialectic Behavioral Therapy Critical Time Intervention Psychosocial Rehabilitation (PSR) Medication Assisted Treatment   

## 2021-10-05 NOTE — Progress Notes (Signed)
POSTPARTUM VISIT Patient name: Leah Kennedy MRN [id number]  Date of birth: February 08, 1996 Chief Complaint:   Postpartum Care  History of Present Illness:   Leah Kennedy is a 26 y.o. [phone] African American female being seen today for a postpartum visit. She is 4 weeks postpartum following a spontaneous vaginal delivery at 39.0 gestational weeks. IOL: no, for n/a. Anesthesia: epidural.  Laceration: periurethral, no repair Complications: none. Inpatient contraception: yes pp [name] 09/03/21 .   Pregnancy uncomplicated. Tobacco use: former . Substance use disorder: no. Last pap smear: 11/19/19 and results were NILM w/ HRHPV not done. Next pap smear due: 2024 No LMP recorded (lmp unknown).  Postpartum course has been complicated by dep/anx . Bleeding none. Bowel function is normal. Bladder function is normal. Urinary incontinence? no, fecal incontinence? no Patient is not sexually active. Last sexual activity: prior to birth of baby. Desired contraception: PP IUD placed.   Upstream - 10/05/21 1151       Pregnancy Intention Screening   Does the patient want to become pregnant in the next year? No    Does the patient's partner want to become pregnant in the next year? No    Would the patient like to discuss contraceptive options today? No      Contraception Wrap Up   Current Method IUD or IUS    End Method IUD or IUS    Contraception Counseling Provided No            The pregnancy intention screening data noted above was reviewed. Potential methods of contraception were discussed. The patient elected to proceed with IUD or IUS.  Edinburgh Postpartum Depression Screening: positive, feels overwhelmed. Trying to work to provide for her and her kids, feels bad about missing out on little things. Her Grandma watches kids for her. Her work is very supportive. Plans to have gastric bypass in June. Hasn't started counseling yet. Has h/o dep/anx, has been on meds in past. Feels zoloft worked  well for her. Denies current SI/HI/II. Not eating or sleeping well. Doesn't find joy in things she used to. Wants to start zoloft again, and is interested in referral to [facility] and [facility].   Edinburgh Postnatal Depression Scale - 10/05/21 1143       Edinburgh Postnatal Depression Scale:  In the Past 7 Days   I have been able to laugh and see the funny side of things. 1    I have looked forward with enjoyment to things. 1    I have blamed myself unnecessarily when things went wrong. 2    I have been anxious or worried for no good reason. 2    I have felt scared or panicky for no good reason. 2    Things have been getting on top of me. 2    I have been so unhappy that I have had difficulty sleeping. 2    I have felt sad or miserable. 2    I have been so unhappy that I have been crying. 2    The thought of harming myself has occurred to me. 0    Edinburgh Postnatal Depression Scale Total 16             GAD 7 : Generalized Anxiety Score 06/20/2021 03/01/2021 04/21/2020 11/19/2019  Nervous, Anxious, on Edge 0 2 0 0  Control/stop worrying 0 2 0 0  Worry too much - different things 0 2 0 0  Trouble relaxing 1 2 1 1   Restless 0 2 0  0  Easily annoyed or irritable 0 2 0 0  Afraid - awful might happen 0 2 0 0  Total GAD 7 Score 1 14 1 1   Anxiety Difficulty - - - Not difficult at all     Baby's course has been uncomplicated. Baby is feeding by bottle. Infant has a pediatrician/family doctor? Yes.  Childcare strategy if returning to work/school: family.  Pt has material needs met for her and baby: Yes.   Review of Systems:   Pertinent items are noted in HPI Denies Abnormal vaginal discharge w/ itching/odor/irritation, headaches, visual changes, shortness of breath, chest pain, abdominal pain, severe nausea/vomiting, or problems with urination or bowel movements. Pertinent History Reviewed:  Reviewed past medical,surgical, obstetrical and family history.  Reviewed problem list, medications and  allergies. OB History  Gravida Para Term Preterm AB Living  4 3 3   1 3   SAB IAB Ectopic Multiple Live Births  1     0 3    # Outcome Date GA Lbr Len/2nd Weight Sex Delivery Anes PTL Lv  4 Term 09/03/21 [redacted]w[redacted]d 02:00 / 00:04 7 lb 2 oz (3.232 kg) F Vag-Spont EPI  LIV     Birth Comments: none  3 Term 10/18/20 [redacted]w[redacted]d / 00:11 6 lb 9.1 oz (2.98 kg) M Vag-Spont EPI  LIV  2 Term 01/17/19 [redacted]w[redacted]d 10:35 / 00:18 7 lb 5.8 oz (3.34 kg) M Vag-Spont EPI N LIV  1 SAB 05/2017           Physical Assessment:   Vitals:   10/05/21 1145  BP: 103/73  Pulse: 72  Weight: 269 lb (122 kg)  Height: 5' 6.5" (1.689 m)  Body mass index is 42.77 kg/m.       Physical Examination:   General appearance: alert, well appearing, and in no distress  Mental status: alert, oriented to person, place, and time  Skin: warm & dry   Cardiovascular: normal heart rate noted   Respiratory: normal respiratory effort, no distress   Breasts: deferred, no complaints   Abdomen: soft, non-tender   Pelvic: IUD strings long, trimmed. Thin prep pap obtained: No  Rectal: not examined  Extremities: Edema: none   Chaperone: [name]         No results found for this or any previous visit (from the past 24 hour(s)).  Assessment & Plan:  1) Postpartum exam 2) 4 wks s/p spontaneous vaginal delivery 3) bottle feeding 4) Depression screening 5) Contraception s/p postplacental Liletta IUD insertion, strings trimmed today 6) Dep/anx> start zoloft 25mg , understands can take few weeks to notice improvement, f/u 4wks. [facility] referral entered. [facility] info given for her to call to make appt  Essential components of care per ACOG recommendations:  1.  Mood and well being:  If positive depression screen, discussed and plan developed.  If using tobacco we discussed reduction/cessation and risk of relapse If current substance abuse, we discussed and referral to local resources was offered.   2. Infant care and feeding:  If breastfeeding,  discussed returning to work, pumping, breastfeeding-associated pain, guidance regarding return to fertility while lactating if not using another method. If needed, patient was provided with a letter to be allowed to pump q 2-3hrs to support lactation in a private location with access to a refrigerator to store breastmilk.   Recommended that all caregivers be immunized for flu, pertussis and other preventable communicable diseases If pt does not have material needs met for her/baby, referred to local resources for help obtaining  these.  3. Sexuality, contraception and birth spacing Provided guidance regarding sexuality, management of dyspareunia, and resumption of intercourse Discussed avoiding interpregnancy interval <28mths and recommended birth spacing of 18 months  4. Sleep and fatigue Discussed coping options for fatigue and sleep disruption Encouraged family/partner/community support of 4 hrs of uninterrupted sleep to help with mood and fatigue  5. Physical recovery  If pt had a C/S, assessed incisional pain and providing guidance on normal vs prolonged recovery If pt had a laceration, perineal healing and pain reviewed.  If urinary or fecal incontinence, discussed management and referred to PT or uro/gyn if indicated  Patient is safe to resume physical activity. Discussed attainment of healthy weight.  6.  Chronic disease management Discussed pregnancy complications if any, and their implications for future childbearing and long-term maternal health. Review recommendations for prevention of recurrent pregnancy complications, such as 17 hydroxyprogesterone caproate to reduce risk for recurrent PTB not applicable, or aspirin to reduce risk of preeclampsia not applicable. Pt had GDM: no. If yes, 2hr GTT scheduled: not applicable. Reviewed medications and non-pregnant dosing including consideration of whether pt is breastfeeding using a reliable resource such as LactMed: not  applicable Referred for f/u w/ PCP or subspecialist providers as indicated: yes  7. Health maintenance Mammogram at 26yo or earlier if indicated Pap smears as indicated  Meds:  Meds ordered this encounter  Medications   sertraline (ZOLOFT) 25 MG tablet    Sig: Take 1 tablet (25 mg total) by mouth daily.    Dispense:  30 tablet    Refill:  3    Order Specific Question:   Supervising Provider    Answer:   Lazaro Arms [[id number]]    Follow-up: Return in about 4 weeks (around 11/02/2021) for med f/u, in person or online.   Orders Placed This Encounter  Procedures   Amb ref to [facility]    Cheral Marker CNM, [facility] 10/05/2021 3:14 PM

## 2021-11-03 ENCOUNTER — Ambulatory Visit: Payer: Medicaid Other | Admitting: Women's Health

## 2021-11-07 ENCOUNTER — Ambulatory Visit: Payer: Medicaid Other | Admitting: Women's Health

## 2021-12-22 ENCOUNTER — Encounter: Payer: Self-pay | Admitting: Adult Health

## 2021-12-22 ENCOUNTER — Ambulatory Visit: Payer: Medicaid Other | Admitting: Adult Health

## 2021-12-22 VITALS — BP 105/63 | HR 62 | Ht 66.5 in | Wt 263.2 lb

## 2021-12-22 DIAGNOSIS — Z30432 Encounter for removal of intrauterine contraceptive device: Secondary | ICD-10-CM | POA: Insufficient documentation

## 2021-12-22 DIAGNOSIS — Z30011 Encounter for initial prescription of contraceptive pills: Secondary | ICD-10-CM | POA: Diagnosis not present

## 2021-12-22 DIAGNOSIS — F53 Postpartum depression: Secondary | ICD-10-CM | POA: Diagnosis not present

## 2021-12-22 MED ORDER — QUETIAPINE FUMARATE 25 MG PO TABS: 25.0000 mg | ORAL_TABLET | Freq: Every day | ORAL | 3 refills | Status: DC

## 2021-12-22 MED ORDER — SERTRALINE HCL 25 MG PO TABS: 25.0000 mg | ORAL_TABLET | Freq: Every day | ORAL | 3 refills | Status: DC

## 2021-12-22 MED ORDER — LO LOESTRIN FE 1 MG-10 MCG / 10 MCG PO TABS: 1.0000 | ORAL_TABLET | Freq: Every day | ORAL | 11 refills | Status: DC

## 2021-12-22 NOTE — Progress Notes (Signed)
Subjective:     [name], female   DOB: 08/21/1995, 26 y.o.   MRN: [id number]  HPI Leah Kennedy is a 26 year old female,single, [phone], in complaining of cramping and bleeding with IUD and wants it removed and to start the pill. She has postpartum depression and she is seeing Leah Kennedy next week and needs refills on Zoloft and Seroquel, she has not been taking lately.  Lab Results  Component Value Date   DIAGPAP  11/19/2019    - Negative for intraepithelial lesion or malignancy (NILM)    Review of Systems +cramping and bleeding with IUD wants it removed +postpartum depression Not having sex  Reviewed past medical,surgical, social and family history. Reviewed medications and allergies.     Objective:   Physical Exam BP 105/63 (BP Location: Right Arm, Patient Position: Sitting, Cuff Size: Normal)   Pulse 62   Ht 5' 6.5" (1.689 m)   Wt 263 lb 3.2 oz (119.4 kg)   Breastfeeding No   BMI 41.85 kg/m  Consent signed for IUD removal.    Skin warm and dry.Pelvic: external genitalia is normal in appearance no lesions, vagina:+blood,urethra has no lesions or masses noted, cervix:smooth and bulbous,IUD strings at os, strings grasped with forceps and pt asked to cough and IUD easily removed, uterus: normal size, shape and contour, non tender, no masses felt, adnexa: no masses or tenderness noted. Bladder is non tender and no masses felt.      12/22/2021   12:28 PM 06/20/2021   11:22 AM 03/01/2021    8:55 AM  Depression screen PHQ 2/9  Decreased Interest 2 1 2   Down, Depressed, Hopeless 2 1 2   PHQ - 2 Score 4 2 4   Altered sleeping 3 3 2   Tired, decreased energy 3 0 2  Change in appetite 3 0 2  Feeling bad or failure about yourself  2 0 2  Trouble concentrating 2 0 2  Moving slowly or fidgety/restless 2 0 0  Suicidal thoughts 1 0 0  PHQ-9 Score 20 5 14    She denies SI or HI ideations.   Upstream - 12/22/21 1209       Pregnancy Intention Screening   Does the patient  want to become pregnant in the next year? No    Does the patient's partner want to become pregnant in the next year? No    Would the patient like to discuss contraceptive options today? Yes      Contraception Wrap Up   Current Method IUD or IUS    End Method Oral Contraceptive    Contraception Counseling Provided Yes            Examination chaperoned by LPN  Assessment:     1. Postpartum depression Will refill Zoloft and Seroquel Meds ordered this encounter  Medications   Norethindrone-Ethinyl Estradiol-Fe Biphas (LO LOESTRIN FE) 1 MG-10 MCG / 10 MCG tablet    Sig: Take 1 tablet by mouth daily. Take 1 daily by mouth    Dispense:  28 tablet    Refill:  11    BIN , PCN CN, GRP 02/21/22    Order Specific Question:   Supervising Provider    Answer:   1210 H [[id number]]   sertraline (ZOLOFT) 25 MG tablet    Sig: Take 1 tablet (25 mg total) by mouth daily.    Dispense:  30 tablet    Refill:  3    Order Specific Question:   Supervising  Provider    Answer:   Lazaro Arms [[id number]]   QUEtiapine (SEROQUEL) 25 MG tablet    Sig: Take 1 tablet (25 mg total) by mouth at bedtime.    Dispense:  30 tablet    Refill:  3    Order Specific Question:   Supervising Provider    Answer:   Lazaro Arms [[id number]]    Keep appt with Leah Kennedy  2. Encounter for IUD removal   3. Encounter for initial prescription of contraceptive pills Start lo Loestrin today, if has sex use condoms     Plan:     Follow up in 3 months for ROS

## 2021-12-22 NOTE — BH Specialist Note (Signed)
Integrated Behavioral Health via Telemedicine Visit  12/29/2021 Leah Kennedy [id number]  Number of Integrated Behavioral Health Clinician visits: 1- Initial Visit  Session Start time: 1528   Session End time: 1540  Total time in minutes: 12   Referring Provider: Shawna Clamp, CNM Patient/Family location: [facility] Provider location: [facility]  All persons participating in visit: Patient Leah Kennedy and [facility] Haillee Johann   Types of Service: Individual psychotherapy and Telephone visit  I connected with Konrad Saha and/or Wendall Stade Chappuis's  n/a  via  Telephone or Video Enabled Telemedicine Application  (Video is Caregility application) and verified that I am speaking with the correct person using two identifiers. Discussed confidentiality: Yes   I discussed the limitations of telemedicine and the availability of in person appointments.  Discussed there is a possibility of technology failure and discussed alternative modes of communication if that failure occurs.  I discussed that engaging in this telemedicine visit, they consent to the provision of behavioral healthcare and the services will be billed under their insurance.  Patient and/or legal guardian expressed understanding and consented to Telemedicine visit: Yes ; consent to brief phone visit today due to move  Presenting Concerns: Patient and/or family reports the following symptoms/concerns: Stress of moving into friend's home today, along with finding out Seroquel is not covered by her insurance; pt is taking Zoloft with (helpful) side effect of sleepiness at night.  Duration of problem: Ongoing; Severity of problem:  severe  Patient and/or Family's Strengths/Protective Factors: Sense of purpose  Goals Addressed: Patient will:  Reduce symptoms of: anxiety, depression, and stress    Progress towards Goals: Ongoing  Interventions: Interventions utilized:   Medication Monitoring and Supportive Reflection Standardized Assessments completed:  PHQ9 given within past two weeks  Patient and/or Family Response: Patient agrees with treatment plan.   Assessment: Patient currently experiencing Mood disorder, unspecified.   Patient may benefit from psychoeducation and brief therapeutic interventions regarding coping with symptoms of anxiety, depression, current life stress .  Plan: Follow up with behavioral health clinician on : Two weeks Behavioral recommendations:  -Continue plan to move today; continue taking Zoloft as prescribed Referral(s): Integrated [employer] (In Clinic)  I discussed the assessment and treatment plan with the patient and/or parent/guardian. They were provided an opportunity to ask questions and all were answered. They agreed with the plan and demonstrated an understanding of the instructions.   They were advised to call back or seek an in-person evaluation if the symptoms worsen or if the condition fails to improve as anticipated.  Rae Lips, LCSW     12/22/2021   12:28 PM 06/20/2021   11:22 AM 03/01/2021    8:55 AM 04/21/2020    2:20 PM 11/19/2019    4:10 PM  Depression screen PHQ 2/9  Decreased Interest 2 1 2  0 2  Down, Depressed, Hopeless 2 1 2  0 0  PHQ - 2 Score 4 2 4  0 2  Altered sleeping 3 3 2 2 1   Tired, decreased energy 3 0 2 2 2   Change in appetite 3 0 2 2 2   Feeling bad or failure about yourself  2 0 2 0 1  Trouble concentrating 2 0 2 0 0  Moving slowly or fidgety/restless 2 0 0 0 0  Suicidal thoughts 1 0 0 0 0  PHQ-9 Score 20 5 14 6 8   Difficult doing work/chores     Not difficult at all  06/20/2021   11:23 AM 03/01/2021    8:56 AM 04/21/2020    2:20 PM 11/19/2019    4:14 PM  GAD 7 : Generalized Anxiety Score  Nervous, Anxious, on Edge 0 2 0 0  Control/stop worrying 0 2 0 0  Worry too much - different things 0 2 0 0  Trouble relaxing 1 2 1 1   Restless 0 2 0 0  Easily  annoyed or irritable 0 2 0 0  Afraid - awful might happen 0 2 0 0  Total GAD 7 Score 1 14 1 1   Anxiety Difficulty    Not difficult at all

## 2021-12-26 ENCOUNTER — Telehealth: Payer: Self-pay | Admitting: *Deleted

## 2021-12-26 NOTE — Telephone Encounter (Signed)
Pt's insurance has denied covering Quetiapine for not meeting prior auth requirements. Please advise. Thanks! JSY

## 2021-12-26 NOTE — Telephone Encounter (Signed)
I called the Community [employer] State #, [phone] and was given some preferred drugs that was covered since the Seroquel was not approved. Preferred drugs are: Wellbutrin SR and XL, Effexor XR brand or generic, Pristiq brand or generic, Sertraline, Celexa, Fluoxetine, Trazodone, Nardil, Mirtazapine, Maprotilime, Duloxetine capsule. Thanks! JSY

## 2021-12-26 NOTE — Telephone Encounter (Signed)
Left message that insurance will not cover Seroquel, take Zoloft daily, and keep appt with Asher Muir.

## 2021-12-29 ENCOUNTER — Ambulatory Visit (INDEPENDENT_AMBULATORY_CARE_PROVIDER_SITE_OTHER): Payer: Medicaid Other | Admitting: Clinical

## 2021-12-29 DIAGNOSIS — F39 Unspecified mood [affective] disorder: Secondary | ICD-10-CM

## 2021-12-29 NOTE — Patient Instructions (Signed)
[facility] at [facility] [address] [place], [place] [address] [phone] (main office) [phone] (Jacinda Kanady's office)  [facility] - [facility] (24/7): [phone]   [address] [place], [place] [address] - 24 Hour Crisis Line: [phone]

## 2022-01-04 NOTE — BH Specialist Note (Unsigned)
Integrated Behavioral Health via Telemedicine Visit  01/04/2022 Lianny Molter [id number]  Number of Integrated Behavioral Health Clinician visits: 1- Initial Visit  Session Start time: 1528   Session End time: 1540  Total time in minutes: 12   Referring Provider: *** Patient/Family location: [facility] Provider location: *** All persons participating in visit: *** Types of Service: {CHL AMB TYPE OF SERVICE:[phone]}  I connected with Konrad Saha and/or Klohe Bartnick's {family members:20773} via  Telephone or Video Enabled Telemedicine Application  (Video is Caregility application) and verified that I am speaking with the correct person using two identifiers. Discussed confidentiality: {YES/NO:21197}  I discussed the limitations of telemedicine and the availability of in person appointments.  Discussed there is a possibility of technology failure and discussed alternative modes of communication if that failure occurs.  I discussed that engaging in this telemedicine visit, they consent to the provision of behavioral healthcare and the services will be billed under their insurance.  Patient and/or legal guardian expressed understanding and consented to Telemedicine visit: {YES/NO:21197}  Presenting Concerns: Patient and/or family reports the following symptoms/concerns: *** Duration of problem: ***; Severity of problem: {Mild/Moderate/Severe:20260}  Patient and/or Family's Strengths/Protective Factors: {CHL AMB BH PROTECTIVE [phone]}  Goals Addressed: Patient will:  Reduce symptoms of: {IBH Symptoms:21014056}   Increase knowledge and/or ability of: {IBH Patient Tools:21014057}   Demonstrate ability to: {IBH Goals:21014053}  Progress towards Goals: {CHL AMB BH PROGRESS TOWARDS GOALS:[phone]}  Interventions: Interventions utilized:  {IBH Interventions:21014054} Standardized Assessments completed: {IBH Screening Tools:21014051}  Patient and/or Family  Response: ***  Assessment: Patient currently experiencing ***.   Patient may benefit from ***.  Plan: Follow up with behavioral health clinician on : *** Behavioral recommendations: *** Referral(s): {IBH Referrals:21014055}  I discussed the assessment and treatment plan with the patient and/or parent/guardian. They were provided an opportunity to ask questions and all were answered. They agreed with the plan and demonstrated an understanding of the instructions.   They were advised to call back or seek an in-person evaluation if the symptoms worsen or if the condition fails to improve as anticipated.  Valetta Close Yaritzel Stange, LCSW

## 2022-01-17 ENCOUNTER — Ambulatory Visit: Payer: Medicaid Other | Admitting: Clinical

## 2022-01-17 DIAGNOSIS — Z91199 Patient's noncompliance with other medical treatment and regimen due to unspecified reason: Secondary | ICD-10-CM

## 2022-03-24 ENCOUNTER — Ambulatory Visit: Payer: Medicaid Other | Admitting: Adult Health

## 2022-03-24 ENCOUNTER — Other Ambulatory Visit (HOSPITAL_COMMUNITY)
Admission: RE | Admit: 2022-03-24 | Discharge: 2022-03-24 | Disposition: A | Discharge status: Home / Self Care | Payer: Medicaid Other | Source: Ambulatory Visit | Attending: Adult Health | Admitting: Adult Health

## 2022-03-24 ENCOUNTER — Encounter: Payer: Self-pay | Admitting: Adult Health

## 2022-03-24 VITALS — BP 112/70 | HR 90 | Ht 66.0 in | Wt 267.5 lb

## 2022-03-24 DIAGNOSIS — Z113 Encounter for screening for infections with a predominantly sexual mode of transmission: Secondary | ICD-10-CM | POA: Insufficient documentation

## 2022-03-24 DIAGNOSIS — F53 Postpartum depression: Secondary | ICD-10-CM

## 2022-03-24 DIAGNOSIS — Z8659 Personal history of other mental and behavioral disorders: Secondary | ICD-10-CM | POA: Insufficient documentation

## 2022-03-24 DIAGNOSIS — F418 Other specified anxiety disorders: Secondary | ICD-10-CM | POA: Diagnosis not present

## 2022-03-24 DIAGNOSIS — Z3041 Encounter for surveillance of contraceptive pills: Secondary | ICD-10-CM

## 2022-03-24 MED ORDER — TRAZODONE HCL 50 MG PO TABS: 50.0000 mg | ORAL_TABLET | Freq: Every day | ORAL | 3 refills | Status: DC

## 2022-03-24 MED ORDER — SERTRALINE HCL 25 MG PO TABS: 25.0000 mg | ORAL_TABLET | Freq: Every day | ORAL | 3 refills | Status: DC

## 2022-03-24 MED ORDER — LO LOESTRIN FE 1 MG-10 MCG / 10 MCG PO TABS: 1.0000 | ORAL_TABLET | Freq: Every day | ORAL | 3 refills | Status: DC

## 2022-03-24 NOTE — Progress Notes (Signed)
Subjective:     [name], female   DOB: 1995-08-13, 26 y.o.   MRN: [id number]  HPI Leah Kennedy is a 26 year old black female,single, [phone] back in follow up on taking OCs and Zoloft, and she stopped OCs in June but wants to resume, and wants to restart Zoloft, still not sleeping well. She has missed appointment with Marijean Niemann, at [facility].  She requests STD screening, thinks he cheated. She had used Seroquel in past, but insurance did not cover, when prescribed last.   Last pap was 11/19/19 and negative for malignancy.   Review of Systems +anxiety and depression Not sleeping well Reviewed past medical,surgical, social and family history. Reviewed medications and allergies.     Objective:   Physical Exam BP 112/70 (BP Location: Left Arm, Patient Position: Sitting, Cuff Size: Large)   Pulse 90   Ht 5\' 6"  (1.676 m)   Wt 267 lb 8 oz (121.3 kg)   LMP 03/07/2022   Breastfeeding No   BMI 43.18 kg/m     Skin warm and dry.Pelvic: external genitalia is normal in appearance no lesions, vagina: white discharge without odor,urethra has no lesions or masses noted, cervix:smooth and bulbous, uterus: normal size, shape and contour, non tender, no masses felt, adnexa: no masses or tenderness noted. Bladder is non tender and no masses felt.CV swab obtained. Fall risk is moderate    03/24/2022   12:19 PM 12/22/2021   12:28 PM 06/20/2021   11:22 AM  Depression screen PHQ 2/9  Decreased Interest 2 2 1   Down, Depressed, Hopeless 2 2 1   PHQ - 2 Score 4 4 2   Altered sleeping 3 3 3   Tired, decreased energy 2 3 0  Change in appetite 2 3 0  Feeling bad or failure about yourself  2 2 0  Trouble concentrating 2 2 0  Moving slowly or fidgety/restless 2 2 0  Suicidal thoughts 2 1 0  PHQ-9 Score 19 20 5        03/24/2022   12:21 PM 06/20/2021   11:23 AM 03/01/2021    8:56 AM 04/21/2020    2:20 PM  GAD 7 : Generalized Anxiety Score  Nervous, Anxious, on Edge 2 0 2 0  Control/stop worrying 2 0 2 0   Worry too much - different things 2 0 2 0  Trouble relaxing 2 1 2 1   Restless 2 0 2 0  Easily annoyed or irritable 1 0 2 0  Afraid - awful might happen 0 0 2 0  Total GAD 7 Score 11 1 14 1       Upstream - 03/24/22 1218       Pregnancy Intention Screening   Does the patient want to become pregnant in the next year? No    Does the patient's partner want to become pregnant in the next year? No    Would the patient like to discuss contraceptive options today? No      Contraception Wrap Up   Current Method Abstinence    End Method Abstinence;Oral Contraceptive    Contraception Counseling Provided Yes            Examination chaperoned by 05/24/2022 LPN   Assessment:     1. Encounter for surveillance of contraceptive pills Will refill lo Loestrin Start with next period and use condoms  Meds ordered this encounter  Medications   traZODone (DESYREL) 50 MG tablet    Sig: Take 1 tablet (50 mg total) by mouth at bedtime.  Dispense:  30 tablet    Refill:  3    Order Specific Question:   Supervising Provider    Answer:   Duane Lope H [[id number]]   sertraline (ZOLOFT) 25 MG tablet    Sig: Take 1 tablet (25 mg total) by mouth daily.    Dispense:  30 tablet    Refill:  3    Order Specific Question:   Supervising Provider    Answer:   Duane Lope H [[id number]]   Norethindrone-Ethinyl Estradiol-Fe Biphas (LO LOESTRIN FE) 1 MG-10 MCG / 10 MCG tablet    Sig: Take 1 tablet by mouth daily. Take 1 daily by mouth    Dispense:  84 tablet    Refill:  3    BIN [id number], PCN CN, GRP [id number]    Order Specific Question:   Supervising Provider    Answer:   Duane Lope H [[id number]]     2. Depression with anxiety She denies any SI or HI Will refill Zoloft and add trazodone Call Marijean Niemann back Discussed going to [facility] or can refer to [facility] in [place]  3. Postpartum depression Will refill Zoloft  4. Screening examination for STD (sexually transmitted disease) CV swab sent for  GC/CHL,trich,BV and yeast     Plan:     Follow up in 8 weeks for ROS

## 2022-03-29 ENCOUNTER — Other Ambulatory Visit: Payer: Self-pay | Admitting: Adult Health

## 2022-03-29 LAB — CERVICOVAGINAL ANCILLARY ONLY
Bacterial Vaginitis (gardnerella): POSITIVE — AB
Candida Glabrata: NEGATIVE
Candida Vaginitis: POSITIVE — AB
Chlamydia: NEGATIVE
Comment: NEGATIVE
Comment: NEGATIVE
Comment: NEGATIVE
Comment: NEGATIVE
Comment: NEGATIVE
Comment: NORMAL
Neisseria Gonorrhea: NEGATIVE
Trichomonas: NEGATIVE

## 2022-03-29 MED ORDER — METRONIDAZOLE 500 MG PO TABS: 500.0000 mg | ORAL_TABLET | Freq: Two times a day (BID) | ORAL | 0 refills | Status: DC

## 2022-03-29 NOTE — Progress Notes (Signed)
+  BV on vaginal swab and yeast will rx flagyl , no sex or alcohol while taking and can use OTC monistat

## 2022-04-10 NOTE — Addendum Note (Signed)
Addended by: Vesta Mixer C on: 04/10/2022 12:17 PM   Modules accepted: Orders

## 2022-05-24 ENCOUNTER — Ambulatory Visit: Payer: Medicaid Other | Admitting: Adult Health

## 2022-05-30 DIAGNOSIS — J069 Acute upper respiratory infection, unspecified: Secondary | ICD-10-CM | POA: Diagnosis not present

## 2022-05-30 DIAGNOSIS — Z20822 Contact with and (suspected) exposure to covid-19: Secondary | ICD-10-CM | POA: Diagnosis not present

## 2022-05-30 DIAGNOSIS — Z720 Tobacco use: Secondary | ICD-10-CM | POA: Diagnosis not present

## 2022-05-30 DIAGNOSIS — H6501 Acute serous otitis media, right ear: Secondary | ICD-10-CM | POA: Diagnosis not present

## 2022-06-12 ENCOUNTER — Other Ambulatory Visit (INDEPENDENT_AMBULATORY_CARE_PROVIDER_SITE_OTHER): Payer: Medicaid Other

## 2022-06-12 VITALS — BP 125/74 | HR 77 | Ht 66.0 in | Wt 276.0 lb

## 2022-06-12 DIAGNOSIS — N926 Irregular menstruation, unspecified: Secondary | ICD-10-CM

## 2022-06-12 DIAGNOSIS — Z3201 Encounter for pregnancy test, result positive: Secondary | ICD-10-CM | POA: Diagnosis not present

## 2022-06-12 LAB — POCT URINE PREGNANCY: Preg Test, Ur: POSITIVE — AB

## 2022-06-12 NOTE — Progress Notes (Signed)
   NURSE VISIT- PREGNANCY CONFIRMATION   SUBJECTIVE:  Leah Kennedy is a 26 y.o. [phone] female at [redacted]w[redacted]d by certain LMP of Patient's last menstrual period was 05/08/2022 (approximate). Here for pregnancy confirmation.  Home pregnancy test: positive x 2   She reports cramping.  She is taking prenatal vitamins.    OBJECTIVE:  BP 125/74 (BP Location: Left Arm, Patient Position: Sitting, Cuff Size: Large)   Pulse 77   Ht 5\' 6"  (1.676 m)   Wt 276 lb (125.2 kg)   LMP 05/08/2022 (Approximate)   Breastfeeding No   BMI 44.55 kg/m   Appears well, in no apparent distress  Results for orders placed or performed in visit on 06/12/22 (from the past 24 hour(s))  POCT urine pregnancy   Collection Time: 06/12/22  4:19 PM  Result Value Ref Range   Preg Test, Ur Positive (A) Negative    ASSESSMENT: Positive pregnancy test, [redacted]w[redacted]d by LMP    PLAN: Schedule for dating ultrasound in 3 weeks Prenatal vitamins: continue   Nausea medicines: not currently needed   OB packet given: Yes  [redacted]w[redacted]d  06/12/2022 4:19 PM

## 2022-06-14 ENCOUNTER — Telehealth: Payer: Self-pay | Admitting: *Deleted

## 2022-06-14 NOTE — Telephone Encounter (Signed)
Patient called stating she woke up this morning to bright red blood in when she went to the bathroom and wiped. Has not noticed any more blood since then.  States she is having very mild cramping and has not had intercourse in the last week.  Advised since it was just once that she noticed the blood, to continue to monitor and if bleeding returned and was heavier or if cramping worsened, to go to [facility] for evaluation.  Pt verbalized understanding and agreeable to plan.

## 2022-06-22 ENCOUNTER — Telehealth: Payer: Self-pay | Admitting: *Deleted

## 2022-06-22 ENCOUNTER — Other Ambulatory Visit: Payer: Medicaid Other

## 2022-06-22 DIAGNOSIS — O209 Hemorrhage in early pregnancy, unspecified: Secondary | ICD-10-CM

## 2022-06-22 NOTE — Telephone Encounter (Signed)
Patient called with complaints of vaginal bleeding. States she has been bleeding since 06/14/22 and has been wearing a tampon and changing it regularly.  Has also passed some blood clots and had some intense abdominal cramping.  Advised patient to come in for HCG level.  Pt agreeable to plan.

## 2022-06-23 ENCOUNTER — Other Ambulatory Visit: Payer: Self-pay | Admitting: Adult Health

## 2022-06-23 DIAGNOSIS — O209 Hemorrhage in early pregnancy, unspecified: Secondary | ICD-10-CM

## 2022-06-23 LAB — BETA HCG QUANT (REF LAB): hCG Quant: 81 m[IU]/mL

## 2022-06-27 LAB — BETA HCG QUANT (REF LAB): hCG Quant: 37 m[IU]/mL

## 2022-06-28 ENCOUNTER — Other Ambulatory Visit: Payer: Self-pay | Admitting: Adult Health

## 2022-06-28 DIAGNOSIS — O039 Complete or unspecified spontaneous abortion without complication: Secondary | ICD-10-CM

## 2022-07-03 ENCOUNTER — Other Ambulatory Visit: Payer: Medicaid Other

## 2022-07-24 NOTE — L&D Delivery Note (Addendum)
OB/GYN Faculty Practice Delivery Note  Leah Kennedy is a 28 y.o. [id number] s/p SVD at [redacted]w[redacted]d. She was admitted for regular contractions.   ROM: 4h 85m with clear fluid GBS Status: Negative/-- (10/16 0208) Maximum Maternal Temperature: Temp (24hrs), Avg:98.5 F (36.9 C), Min:98.1 F (36.7 C), Max:99.1 F (37.3 C)   Labor Progress: Initial SVE: 4.5/50/-2. AROM required. She then progressed to complete.   Delivery Date/Time: 05/24/23 Delivery: Called to room and patient was feeling urge to push. Found to be 9.5. Lip quickly disappeared, and babe descended. Pt pushed through one contraction. Head delivered ROA. Shoulder and body delivered in usual fashion. Nuchal and body cords present and were reduced after delivery via somersault maneuver. Infant with spontaneous cry, placed on mother's abdomen, dried and stimulated. Cord clamped x 2 after 1-minute delay, and cut by nurse. Cord blood drawn. Placenta delivered spontaneously with gentle cord traction and fundal massage. Fundus firm with massage and Pitocin. Labia, perineum, and vagina inspected with no lacerations.  Baby Weight: pending  Placenta: 3 vessel, intact. Sent to L&D Complications: None Lacerations: None EBL: 82 mL Anesthesia: epidural  Infant:  APGAR (1 MIN): 9  APGAR (5 MINS): 9  APGAR (10 MINS):    Bryna Colander, MD PGY2 05/24/2023, 11:57 AM  Attestation of Supervision of Student:  I was gloved and present for entirety of delivery. Was called about bleeding after delivery. Given TXA, methergine, cytotec for persistent trickle. Total QBL ~250.  Joanne Gavel, MD OB Fellow 05/24/2023 12:51 PM

## 2022-10-02 ENCOUNTER — Ambulatory Visit (INDEPENDENT_AMBULATORY_CARE_PROVIDER_SITE_OTHER): Payer: Medicaid Other | Admitting: *Deleted

## 2022-10-02 ENCOUNTER — Other Ambulatory Visit: Payer: Self-pay | Admitting: Adult Health

## 2022-10-02 DIAGNOSIS — N926 Irregular menstruation, unspecified: Secondary | ICD-10-CM

## 2022-10-02 DIAGNOSIS — Z3201 Encounter for pregnancy test, result positive: Secondary | ICD-10-CM | POA: Diagnosis not present

## 2022-10-02 LAB — POCT URINE PREGNANCY: Preg Test, Ur: POSITIVE — AB

## 2022-10-02 MED ORDER — PROMETHAZINE HCL 12.5 MG PO TABS: 12.5000 mg | ORAL_TABLET | Freq: Four times a day (QID) | ORAL | 1 refills | Status: DC | PRN

## 2022-10-02 NOTE — Progress Notes (Signed)
Rx phenergan

## 2022-10-02 NOTE — Progress Notes (Signed)
   NURSE VISIT- PREGNANCY CONFIRMATION   SUBJECTIVE:  Leah Kennedy is a 27 y.o. [phone] female at [redacted]w[redacted]d by certain LMP of Patient's last menstrual period was 09/01/2022. Here for pregnancy confirmation.  Home pregnancy test: positive x 4   She reports nausea.  She is taking prenatal vitamins.    OBJECTIVE:  LMP 09/01/2022   Breastfeeding No   Appears well, in no apparent distress  Results for orders placed or performed in visit on 10/02/22 (from the past 24 hour(s))  POCT urine pregnancy   Collection Time: 10/02/22  3:16 PM  Result Value Ref Range   Preg Test, Ur Positive (A) Negative    ASSESSMENT: Positive pregnancy test, [redacted]w[redacted]d by LMP    PLAN: Schedule for dating ultrasound in 3 weeks Prenatal vitamins: continue   Nausea medicines: requested-note routed to Derrek Monaco, NP to send prescription  Also requesting refill on Zoloft  OB packet given: Yes  Alice Rieger  10/02/2022 3:19 PM

## 2022-10-23 ENCOUNTER — Other Ambulatory Visit: Payer: Self-pay | Admitting: Obstetrics & Gynecology

## 2022-10-23 DIAGNOSIS — O3680X0 Pregnancy with inconclusive fetal viability, not applicable or unspecified: Secondary | ICD-10-CM

## 2022-10-24 ENCOUNTER — Ambulatory Visit (INDEPENDENT_AMBULATORY_CARE_PROVIDER_SITE_OTHER): Payer: Medicaid Other

## 2022-10-24 DIAGNOSIS — Z3481 Encounter for supervision of other normal pregnancy, first trimester: Secondary | ICD-10-CM | POA: Diagnosis not present

## 2022-10-24 DIAGNOSIS — Z3A01 Less than 8 weeks gestation of pregnancy: Secondary | ICD-10-CM | POA: Diagnosis not present

## 2022-10-24 DIAGNOSIS — O3680X0 Pregnancy with inconclusive fetal viability, not applicable or unspecified: Secondary | ICD-10-CM

## 2022-10-24 NOTE — Progress Notes (Signed)
[place] 8+4 wks,single IUP with yolk sac,CRL 19.70 mm,FHR 166 bpm,normal ovaries

## 2022-10-25 ENCOUNTER — Other Ambulatory Visit: Payer: Self-pay | Admitting: Obstetrics & Gynecology

## 2022-10-25 DIAGNOSIS — Z3682 Encounter for antenatal screening for nuchal translucency: Secondary | ICD-10-CM

## 2022-10-25 NOTE — Progress Notes (Signed)
NT order

## 2022-11-23 ENCOUNTER — Encounter: Payer: Self-pay | Admitting: Advanced Practice Midwife

## 2022-11-23 ENCOUNTER — Other Ambulatory Visit (HOSPITAL_COMMUNITY)
Admission: RE | Admit: 2022-11-23 | Discharge: 2022-11-23 | Disposition: A | Discharge status: Home / Self Care | Payer: Medicaid Other | Source: Ambulatory Visit | Attending: Advanced Practice Midwife | Admitting: Advanced Practice Midwife

## 2022-11-23 ENCOUNTER — Encounter: Payer: Medicaid Other | Admitting: *Deleted

## 2022-11-23 ENCOUNTER — Ambulatory Visit (INDEPENDENT_AMBULATORY_CARE_PROVIDER_SITE_OTHER): Payer: Medicaid Other

## 2022-11-23 ENCOUNTER — Ambulatory Visit (INDEPENDENT_AMBULATORY_CARE_PROVIDER_SITE_OTHER): Payer: Medicaid Other | Admitting: Advanced Practice Midwife

## 2022-11-23 VITALS — BP 127/77 | HR 82 | Wt 270.0 lb

## 2022-11-23 DIAGNOSIS — Z113 Encounter for screening for infections with a predominantly sexual mode of transmission: Secondary | ICD-10-CM | POA: Diagnosis present

## 2022-11-23 DIAGNOSIS — Z3A12 12 weeks gestation of pregnancy: Secondary | ICD-10-CM

## 2022-11-23 DIAGNOSIS — Z124 Encounter for screening for malignant neoplasm of cervix: Secondary | ICD-10-CM | POA: Diagnosis present

## 2022-11-23 DIAGNOSIS — Z3682 Encounter for antenatal screening for nuchal translucency: Secondary | ICD-10-CM

## 2022-11-23 DIAGNOSIS — Z131 Encounter for screening for diabetes mellitus: Secondary | ICD-10-CM | POA: Diagnosis not present

## 2022-11-23 DIAGNOSIS — Z3480 Encounter for supervision of other normal pregnancy, unspecified trimester: Secondary | ICD-10-CM | POA: Diagnosis not present

## 2022-11-23 DIAGNOSIS — Z3490 Encounter for supervision of normal pregnancy, unspecified, unspecified trimester: Secondary | ICD-10-CM | POA: Insufficient documentation

## 2022-11-23 DIAGNOSIS — Z363 Encounter for antenatal screening for malformations: Secondary | ICD-10-CM

## 2022-11-23 DIAGNOSIS — Z6841 Body Mass Index (BMI) 40.0 and over, adult: Secondary | ICD-10-CM | POA: Diagnosis not present

## 2022-11-23 MED ORDER — ONDANSETRON 4 MG PO TBDP: 4.0000 mg | ORAL_TABLET | Freq: Four times a day (QID) | ORAL | 2 refills | Status: DC | PRN

## 2022-11-23 MED ORDER — ASPIRIN 81 MG PO TBEC: 162.0000 mg | DELAYED_RELEASE_TABLET | Freq: Every day | ORAL | 6 refills | Status: DC

## 2022-11-23 NOTE — Patient Instructions (Addendum)
[facility] offers online women's holistic mental health counseling and therapy provided by licensed mental health counselors and therapists.   You can refer yourself using the link below: (if it isn't clickable from your mychart account, copy and paste it in a new browser).  If you have ANY problems, please let me know and I will help troubleshoot.   [email or web address]   Leah Kennedy, I greatly value your feedback.  If you receive a survey following your visit with [place] today, we appreciate you taking the time to fill it out.  Thanks, Cathie Beams, DNP, CNM  [facility] HAS MOVED!!! It is now [facility] ([address] [place], [place] [address]) Entrance located off of [address] Free 24/7 valet parking   Nausea & Vomiting Have saltine crackers or pretzels by your bed and eat a few bites before you raise your head out of bed in the morning Eat small frequent meals throughout the day instead of large meals Drink plenty of fluids throughout the day to stay hydrated, just don't drink a lot of fluids with your meals.  This can make your stomach fill up faster making you feel sick Do not brush your teeth right after you eat Products with real ginger are good for nausea, like ginger ale and ginger hard candy Make sure it says made with real ginger! Sucking on sour candy like lemon heads is also good for nausea If your prenatal vitamins make you nauseated, take them at night so you will sleep through the nausea Sea Bands If you feel like you need medicine for the nausea & vomiting please let us know If you are unable to keep any fluids or food down please let us know   Constipation Drink plenty of fluid, preferably water, throughout the day Eat foods high in fiber such as fruits, vegetables, and grains Exercise, such as walking, is a good way to keep your bowels regular Drink warm fluids, especially warm prune  juice, or decaf coffee Eat a 1/2 cup of real oatmeal (not instant), 1/2 cup applesauce, and 1/2-1 cup warm prune juice every day If needed, you may take Colace (docusate sodium) stool softener once or twice a day to help keep the stool soft.  If you still are having problems with constipation, you may take Miralax once daily as needed to help keep your bowels regular.   Home Blood Pressure Monitoring for Patients   Your provider has recommended that you check your blood pressure (BP) at least once a week at home. If you do not have a blood pressure cuff at home, one will be provided for you. Contact your provider if you have not received your monitor within 1 week.   Helpful Tips for Accurate Home Blood Pressure Checks  Don't smoke, exercise, or drink caffeine 30 minutes before checking your BP Use the restroom before checking your BP (a full bladder can raise your pressure) Relax in a comfortable upright chair Feet on the ground Left arm resting comfortably on a flat surface at the level of your heart Legs uncrossed Back supported Sit quietly and don't talk Place the cuff on your bare arm Adjust snuggly, so that only two fingertips can fit between your skin and the top of the cuff Check 2 readings separated by at least one minute Keep a log of your BP readings For a visual, please reference this diagram: [email or web address].care/bpdiagram  Provider Name: [facility]     Phone: [phone]  Zone 1: ALL CLEAR  Continue to monitor your symptoms:  BP reading is less than 140 (top number) or less than 90 (bottom number)  No right upper stomach pain No headaches or seeing spots No feeling nauseated or throwing up No swelling in face and hands  Zone 2: CAUTION Call your doctor's office for any of the following:  BP reading is greater than 140 (top number) or greater than 90 (bottom number)  Stomach pain under your ribs in the middle or right side Headaches or seeing spots Feeling  nauseated or throwing up Swelling in face and hands  Zone 3: EMERGENCY  Seek immediate medical care if you have any of the following:  BP reading is greater than160 (top number) or greater than 110 (bottom number) Severe headaches not improving with Tylenol Serious difficulty catching your breath Any worsening symptoms from Zone 2    First Trimester of Pregnancy The first trimester of pregnancy is from week 1 until the end of week 12 (months 1 through 3). A week after a sperm fertilizes an egg, the egg will implant on the wall of the uterus. This embryo will begin to develop into a baby. Genes from you and your partner are forming the baby. The female genes determine whether the baby is a boy or a girl. At 6-8 weeks, the eyes and face are formed, and the heartbeat can be seen on ultrasound. At the end of 12 weeks, all the baby's organs are formed.  Now that you are pregnant, you will want to do everything you can to have a healthy baby. Two of the most important things are to get good prenatal care and to follow your health care provider's instructions. Prenatal care is all the medical care you receive before the baby's birth. This care will help prevent, find, and treat any problems during the pregnancy and childbirth. BODY CHANGES Your body goes through many changes during pregnancy. The changes vary from woman to woman.  You may gain or lose a couple of pounds at first. You may feel sick to your stomach (nauseous) and throw up (vomit). If the vomiting is uncontrollable, call your health care provider. You may tire easily. You may develop headaches that can be relieved by medicines approved by your health care provider. You may urinate more often. Painful urination may mean you have a bladder infection. You may develop heartburn as a result of your pregnancy. You may develop constipation because certain hormones are causing the muscles that push waste through your intestines to slow down. You  may develop hemorrhoids or swollen, bulging veins (varicose veins). Your breasts may begin to grow larger and become tender. Your nipples may stick out more, and the tissue that surrounds them (areola) may become darker. Your gums may bleed and may be sensitive to brushing and flossing. Dark spots or blotches (chloasma, mask of pregnancy) may develop on your face. This will likely fade after the baby is born. Your menstrual periods will stop. You may have a loss of appetite. You may develop cravings for certain kinds of food. You may have changes in your emotions from day to day, such as being excited to be pregnant or being concerned that something may go wrong with the pregnancy and baby. You may have more vivid and strange dreams. You may have changes in your hair. These can include thickening of your hair, rapid growth, and changes in texture. Some women also have hair loss during or after pregnancy, or hair  that feels dry or thin. Your hair will most likely return to normal after your baby is born. WHAT TO EXPECT AT YOUR PRENATAL VISITS During a routine prenatal visit: You will be weighed to make sure you and the baby are growing normally. Your blood pressure will be taken. Your abdomen will be measured to track your baby's growth. The fetal heartbeat will be listened to starting around week 10 or 12 of your pregnancy. Test results from any previous visits will be discussed. Your health care provider may ask you: How you are feeling. If you are feeling the baby move. If you have had any abnormal symptoms, such as leaking fluid, bleeding, severe headaches, or abdominal cramping. If you have any questions. Other tests that may be performed during your first trimester include: Blood tests to find your blood type and to check for the presence of any previous infections. They will also be used to check for low iron levels (anemia) and Rh antibodies. Later in the pregnancy, blood tests for  diabetes will be done along with other tests if problems develop. Urine tests to check for infections, diabetes, or protein in the urine. An ultrasound to confirm the proper growth and development of the baby. An amniocentesis to check for possible genetic problems. Fetal screens for spina bifida and Down syndrome. You may need other tests to make sure you and the baby are doing well. HOME CARE INSTRUCTIONS  Medicines Follow your health care provider's instructions regarding medicine use. Specific medicines may be either safe or unsafe to take during pregnancy. Take your prenatal vitamins as directed. If you develop constipation, try taking a stool softener if your health care provider approves. Diet Eat regular, well-balanced meals. Choose a variety of foods, such as meat or vegetable-based protein, fish, milk and low-fat dairy products, vegetables, fruits, and whole grain breads and cereals. Your health care provider will help you determine the amount of weight gain that is right for you. Avoid raw meat and uncooked cheese. These carry germs that can cause birth defects in the baby. Eating four or five small meals rather than three large meals a day may help relieve nausea and vomiting. If you start to feel nauseous, eating a few soda crackers can be helpful. Drinking liquids between meals instead of during meals also seems to help nausea and vomiting. If you develop constipation, eat more high-fiber foods, such as fresh vegetables or fruit and whole grains. Drink enough fluids to keep your urine clear or pale yellow. Activity and Exercise Exercise only as directed by your health care provider. Exercising will help you: Control your weight. Stay in shape. Be prepared for labor and delivery. Experiencing pain or cramping in the lower abdomen or low back is a good sign that you should stop exercising. Check with your health care provider before continuing normal exercises. Try to avoid standing  for long periods of time. Move your legs often if you must stand in one place for a long time. Avoid heavy lifting. Wear low-heeled shoes, and practice good posture. You may continue to have sex unless your health care provider directs you otherwise. Relief of Pain or Discomfort Wear a good support bra for breast tenderness.   Take warm sitz baths to soothe any pain or discomfort caused by hemorrhoids. Use hemorrhoid cream if your health care provider approves.   Rest with your legs elevated if you have leg cramps or low back pain. If you develop varicose veins in your legs, wear support  hose. Elevate your feet for 15 minutes, 3-4 times a day. Limit salt in your diet. Prenatal Care Schedule your prenatal visits by the twelfth week of pregnancy. They are usually scheduled monthly at first, then more often in the last 2 months before delivery. Write down your questions. Take them to your prenatal visits. Keep all your prenatal visits as directed by your health care provider. Safety Wear your seat belt at all times when driving. Make a list of emergency phone numbers, including numbers for family, friends, the hospital, and police and fire departments. General Tips Ask your health care provider for a referral to a local prenatal education class. Begin classes no later than at the beginning of month 6 of your pregnancy. Ask for help if you have counseling or nutritional needs during pregnancy. Your health care provider can offer advice or refer you to specialists for help with various needs. Do not use hot tubs, steam rooms, or saunas. Do not douche or use tampons or scented sanitary pads. Do not cross your legs for long periods of time. Avoid cat litter boxes and soil used by cats. These carry germs that can cause birth defects in the baby and possibly loss of the fetus by miscarriage or stillbirth. Avoid all smoking, herbs, alcohol, and medicines not prescribed by your health care provider.  Chemicals in these affect the formation and growth of the baby. Schedule a dentist appointment. At home, brush your teeth with a soft toothbrush and be gentle when you floss. SEEK MEDICAL CARE IF:  You have dizziness. You have mild pelvic cramps, pelvic pressure, or nagging pain in the abdominal area. You have persistent nausea, vomiting, or diarrhea. You have a bad smelling vaginal discharge. You have pain with urination. You notice increased swelling in your face, hands, legs, or ankles. SEEK IMMEDIATE MEDICAL CARE IF:  You have a fever. You are leaking fluid from your vagina. You have spotting or bleeding from your vagina. You have severe abdominal cramping or pain. You have rapid weight gain or loss. You vomit blood or material that looks like coffee grounds. You are exposed to [place] measles and have never had them. You are exposed to fifth disease or chickenpox. You develop a severe headache. You have shortness of breath. You have any kind of trauma, such as from a fall or a car accident. Document Released: 07/04/2001 Document Revised: 11/24/2013 Document Reviewed: 05/20/2013 [facility] Patient Information 2015 [place], [place]. This information is not intended to replace advice given to you by your health care provider. Make sure you discuss any questions you have with your health care provider.  ADDITIONAL HEALTHCARE OPTIONS FOR PATIENTS  [facility] / e-Visit: [email or web address]         [facility]: [phone]  [facility]: [phone]                   [facility]: [phone]     Safe Medications in Pregnancy   Acne: Benzoyl Peroxide Salicylic Acid  Backache/Headache: Tylenol: 2 regular strength every 4 hours OR              2 Extra strength every 6 hours  Colds/Coughs/Allergies: Benadryl (alcohol free) 25 mg every 6 hours as needed Breath right  strips Claritin Cepacol throat lozenges Chloraseptic throat spray Cold-Eeze- up to three times per day Cough drops, alcohol free Flonase (by prescription only) Guaifenesin Mucinex Robitussin DM (plain only, alcohol free) Saline nasal spray/drops Sudafed (pseudoephedrine) & Actifed **  use only after [redacted] weeks gestation and if you do not have high blood pressure Tylenol Vicks Vaporub Zinc lozenges Zyrtec   Constipation: Colace Ducolax suppositories Fleet enema Glycerin suppositories Metamucil Milk of magnesia Miralax Senokot Smooth move tea  Diarrhea: Kaopectate Imodium A-D  *NO pepto Bismol  Hemorrhoids: Anusol Anusol HC Preparation H Tucks  Indigestion: Tums Maalox Mylanta Zantac  Pepcid  Insomnia: Benadryl (alcohol free) 25mg  every 6 hours as needed Tylenol PM Unisom, no Gelcaps  Leg Cramps: Tums MagGel  Nausea/Vomiting:  Bonine Dramamine Emetrol Ginger extract Sea bands Meclizine  Nausea medication to take during pregnancy:  Unisom (doxylamine succinate 25 mg tablets) Take one tablet daily at bedtime. If symptoms are not adequately controlled, the dose can be increased to a maximum recommended dose of two tablets daily (1/2 tablet in the morning, 1/2 tablet mid-afternoon and one at bedtime). Vitamin B6 100mg  tablets. Take one tablet twice a day (up to 200 mg per day).  Skin Rashes: Aveeno products Benadryl cream or 25mg  every 6 hours as needed Calamine Lotion 1% cortisone cream  Yeast infection: Gyne-lotrimin 7 Monistat 7   **If taking multiple medications, please check labels to avoid duplicating the same active ingredients **take medication as directed on the label ** Do not exceed 4000 mg of tylenol in 24 hours **Do not take medications that contain aspirin or ibuprofen What Is Wet Wrap Therapy for Eczema? It's simple - soak and seal. Wet wrapping is when you soak the skin in water, then seal with a moisturizer and damp clothing  to hydrate the skin for an extended period of time. Here's how to do it in 6 easy steps!  Soak in lukewarm bath water for 15-20 minutes. No soap! Afterwards the skin is lightly dried, then sealed with a heavy layer of cream or balm/salve. (such as Aveeno or Eucerin) Next dress the skin with damp clothing or a layer of damp bandages. Then top with a layer of dry clothing or bandages. Leave the wraps on for at least two hours. Add another layer of cream or balm salve, then dress as usual. works well for psoriasis, allergic contact dermatitis, and dermatomyositis. This can be repeated several times a day

## 2022-11-23 NOTE — Progress Notes (Signed)
[place] 12+6 wks,measurements c/w dates,CRL 69.15 mm,fhr 146 bpm,NB present,NT 1.9 mm,normal ovaries

## 2022-11-23 NOTE — Progress Notes (Signed)
INITIAL OBSTETRICAL VISIT Patient name: Leah Kennedy MRN [id number]  Date of birth: 09-03-1995 Chief Complaint:   Initial Prenatal Visit ("Popping pain", nausea)  History of Present Illness:   Leah Kennedy is a 27 y.o. [id number]  female at [redacted]w[redacted]d by LMP c/w u/s at 8 weeks with an Estimated Date of Delivery: 06/01/23 being seen today for her initial obstetrical visit.   Her obstetrical history is significant for  Term SVD X 3 (last one 15 months ago); Morbid obestiy;  hx PPD/depression; Has been on zoloft in the past, wants to restart. 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  Today she reports some symphysis pubic pain like w/last pg--ordered a good maternity belt. Wants to restart zoloft..Had declined/no showed for [facility] appointments in the past, offered [facility], encouraged to contact. .      11/23/2022   11:25 AM 03/24/2022   12:19 PM 12/22/2021   12:28 PM 06/20/2021   11:22 AM 03/01/2021    8:55 AM  Depression screen PHQ 2/9  Decreased Interest 3 2 2 1 2   Down, Depressed, Hopeless 3 2 2 1 2   PHQ - 2 Score 6 4 4 2 4   Altered sleeping 3 3 3 3 2   Tired, decreased energy 3 2 3  0 2  Change in appetite 3 2 3  0 2  Feeling bad or failure about yourself  3 2 2  0 2  Trouble concentrating 3 2 2  0 2  Moving slowly or fidgety/restless 3 2 2  0 0  Suicidal thoughts 0 2 1 0 0  PHQ-9 Score 24 19 20 5 14     Patient's last menstrual period was 09/01/2022. Last pap  Diagnosis  Date Value Ref Range Status  11/19/2019   Final   - Negative for intraepithelial lesion or malignancy (NILM)   Review of Systems:   Pertinent items are noted in HPI Denies cramping/contractions, leakage of fluid, vaginal bleeding, abnormal vaginal discharge w/ itching/odor/irritation, headaches, visual changes, shortness of breath, chest pain, abdominal pain, severe nausea/vomiting, or problems with urination or bowel movements unless otherwise stated above.  Pertinent History Reviewed:  Reviewed past medical,surgical,  social, obstetrical and family history.  Reviewed problem list, medications and allergies. OB History  Gravida Para Term Preterm AB Living  6 3 3   2 3   SAB IAB Ectopic Multiple Live Births  2     0 3    # Outcome Date GA Lbr Len/2nd Weight Sex Delivery Anes PTL Lv  6 Current           5 SAB 06/2022          4 Term 09/03/21 [redacted]w[redacted]d 02:00 / 00:04 7 lb 2 oz (3.232 kg) F Vag-Spont EPI  LIV     Birth Comments: none  3 Term 10/18/20 [redacted]w[redacted]d / 00:11 6 lb 9.1 oz (2.98 kg) M Vag-Spont EPI  LIV  2 Term 01/17/19 [redacted]w[redacted]d 10:35 / 00:18 7 lb 5.8 oz (3.34 kg) M Vag-Spont EPI N LIV  1 SAB 05/2017           Physical Assessment:   Vitals:   11/23/22 1025  BP: 127/77  Pulse: 82  Weight: 270 lb (122.5 kg)  Body mass index is 43.58 kg/m.       Physical Examination:  General appearance - well appearing, and in no distress  Mental status - alert, oriented to person, place, and time  Psych:  She has a normal mood and affect  Skin - warm and dry, normal color,  no suspicious lesions noted  Chest - effort normal  Heart - normal rate and regular rhythm  Abdomen - soft, nontender  Extremities:  No swelling or varicosities noted  Pelvic - VULVA: normal appearing vulva with no masses, tenderness or lesions  VAGINA: normal appearing vagina with normal color and discharge, no lesions  CERVIX: normal appearing cervix without discharge or lesions, no CMT  Thin prep pap is done with HR HPV cotesting  TODAY'S NT [place] 12+6 wks,measurements c/w dates,CRL 69.15 mm,fhr 146 bpm,NB present,NT 1.9 mm,normal ovaries   No results found for this or any previous visit (from the past 24 hour(s)).        11/23/2022   11:25 AM 03/24/2022   12:19 PM 12/22/2021   12:28 PM 06/20/2021   11:22 AM 03/01/2021    8:55 AM  Depression screen PHQ 2/9  Decreased Interest 3 2 2 1 2   Down, Depressed, Hopeless 3 2 2 1 2   PHQ - 2 Score 6 4 4 2 4   Altered sleeping 3 3 3 3 2   Tired, decreased energy 3 2 3  0 2  Change in appetite 3 2 3  0 2   Feeling bad or failure about yourself  3 2 2  0 2  Trouble concentrating 3 2 2  0 2  Moving slowly or fidgety/restless 3 2 2  0 0  Suicidal thoughts 0 2 1 0 0  PHQ-9 Score 24 19 20 5 14         11/23/2022   11:25 AM 03/24/2022   12:21 PM 06/20/2021   11:23 AM 03/01/2021    8:56 AM  GAD 7 : Generalized Anxiety Score  Nervous, Anxious, on Edge 3 2 0 2  Control/stop worrying 3 2 0 2  Worry too much - different things 3 2 0 2  Trouble relaxing 3 2 1 2   Restless 3 2 0 2  Easily annoyed or irritable 2 1 0 2  Afraid - awful might happen 2 0 0 2  Total GAD 7 Score 19 11 1 14       Assessment & Plan:  1) Low-Risk Pregnancy [id number] at [redacted]w[redacted]d with an Estimated Date of Delivery: 06/01/23   2) Initial OB visit    1. Supervision of other normal pregnancy, antepartum  - Integrated 1 - Urine Culture - Hemoglobin A1c - CHL AMB BABYSCRIPTS SCHEDULE OPTIMIZATION - CBC/D/Plt+RPR+Rh+ABO+RubIgG... - Cytology - PAP - PANORAMA PRENATAL TEST FULL PANEL  2. [redacted] weeks gestation of pregnancy   3. Body mass index (BMI) 40.0-44.9, adult (HCC) Growth [place] q 4 weeks > 24 weeks; testing at 36 weeks, deliver 39-40 per MFM  ASA 162mg   4. Screening for diabetes mellitus  - Hemoglobin A1c  5. Screening for STD (sexually transmitted disease) GC,/CHL  6. Screening for cervical cancer  - Cytology - PAP       Meds:  Meds ordered this encounter  Medications   aspirin EC 81 MG tablet    Sig: Take 2 tablets (162 mg total) by mouth daily.    Dispense:  60 tablet    Refill:  6    Order Specific Question:   Supervising Provider    Answer:   Despina Hidden, LUTHER H [[id number]]   ondansetron (ZOFRAN-ODT) 4 MG disintegrating tablet    Sig: Take 1 tablet (4 mg total) by mouth every 6 (six) hours as needed for nausea.    Dispense:  30 tablet    Refill:  2    Order Specific Question:   Supervising Provider  Answer:   Duane Lope H [[id number]]    Initial labs obtained Continue prenatal vitamins Reviewed n/v relief  measures and warning s/s to report Reviewed recommended weight gain based on pre-gravid BMI Encouraged well-balanced diet Genetic & carrier screening discussed: requests Panorama and NT/IT, declines AFP and Horizon  Ultrasound discussed; fetal survey: requested CCNC completed> form faxed if has or is planning to apply for medicaid The nature of [facility] [employer] with multiple MDs and other Advanced Practice Providers was explained to patient; also emphasized that fellows, residents, and students are part of our team. Has home bp cuff. Check bp weekly, let us know if >140/90.        Leah Kennedy 12:00 PM

## 2022-11-24 LAB — INTEGRATED 1

## 2022-11-25 LAB — CBC/D/PLT+RPR+RH+ABO+RUBIGG...
Antibody Screen: NEGATIVE
Basophils Absolute: 0 10*3/uL (ref 0.0–0.2)
Basos: 0 %
EOS (ABSOLUTE): 0.1 10*3/uL (ref 0.0–0.4)
Eos: 1 %
HCV Ab: NONREACTIVE
HIV Screen 4th Generation wRfx: NONREACTIVE
Hematocrit: 35.7 % (ref 34.0–46.6)
Hemoglobin: 11.8 g/dL (ref 11.1–15.9)
Hepatitis B Surface Ag: NEGATIVE
Immature Grans (Abs): 0 10*3/uL (ref 0.0–0.1)
Immature Granulocytes: 0 %
Lymphocytes Absolute: 3 10*3/uL (ref 0.7–3.1)
Lymphs: 31 %
MCH: 29.1 pg (ref 26.6–33.0)
MCHC: 33.1 g/dL (ref 31.5–35.7)
MCV: 88 fL (ref 79–97)
Monocytes Absolute: 0.7 10*3/uL (ref 0.1–0.9)
Monocytes: 7 %
Neutrophils Absolute: 6 10*3/uL (ref 1.4–7.0)
Neutrophils: 61 %
Platelets: 251 10*3/uL (ref 150–450)
RBC: 4.06 x10E6/uL (ref 3.77–5.28)
RDW: 13.7 % (ref 11.7–15.4)
RPR Ser Ql: NONREACTIVE
Rh Factor: POSITIVE
Rubella Antibodies, IGG: 7.73 index (ref >0.99)
WBC: 9.9 10*3/uL (ref 3.4–10.8)

## 2022-11-25 LAB — INTEGRATED 1
Crown Rump Length: 69.2 mm
Gest. Age on Collection Date: 13 weeks
Maternal Age at EDD: 25.2 yr
Nuchal Translucency (NT): 1.9 mm
Number of Fetuses: 1
PAPP-A Value: 751.2 ng/mL
Weight: 270 [lb_av]

## 2022-11-25 LAB — URINE CULTURE

## 2022-11-25 LAB — HEMOGLOBIN A1C
Est. average glucose Bld gHb Est-mCnc: 103 mg/dL
Hgb A1c MFr Bld: 5.2 % (ref 4.8–5.6)

## 2022-11-25 LAB — HCV INTERPRETATION

## 2022-11-28 LAB — CYTOLOGY - PAP
Chlamydia: NEGATIVE
Comment: NEGATIVE
Comment: NEGATIVE
Comment: NORMAL
Diagnosis: UNDETERMINED — AB
High risk HPV: NEGATIVE
Neisseria Gonorrhea: NEGATIVE

## 2022-12-01 LAB — PANORAMA PRENATAL TEST FULL PANEL:PANORAMA TEST PLUS 5 ADDITIONAL MICRODELETIONS: FETAL FRACTION: 5.4

## 2022-12-14 ENCOUNTER — Ambulatory Visit (INDEPENDENT_AMBULATORY_CARE_PROVIDER_SITE_OTHER): Payer: Medicaid Other | Admitting: Obstetrics & Gynecology

## 2022-12-14 ENCOUNTER — Encounter: Payer: Self-pay | Admitting: Obstetrics & Gynecology

## 2022-12-14 VITALS — BP 123/74 | HR 86 | Wt 258.0 lb

## 2022-12-14 DIAGNOSIS — Z3480 Encounter for supervision of other normal pregnancy, unspecified trimester: Secondary | ICD-10-CM

## 2022-12-14 DIAGNOSIS — Z1379 Encounter for other screening for genetic and chromosomal anomalies: Secondary | ICD-10-CM

## 2022-12-14 MED ORDER — SERTRALINE HCL 25 MG PO TABS: 25.0000 mg | ORAL_TABLET | Freq: Every day | ORAL | 11 refills | Status: DC

## 2022-12-14 NOTE — Progress Notes (Signed)
   LOW-RISK PREGNANCY VISIT Patient name: Leah Kennedy MRN [id number]  Date of birth: [birth year] Chief Complaint:   Routine Prenatal Visit  History of Present Illness:   Leah Kennedy is a 27 y.o. [id number] female at [redacted]w[redacted]d with an Estimated Date of Delivery: 06/01/23 being seen today for ongoing management of a low-risk pregnancy.     11/23/2022   11:25 AM 03/24/2022   12:19 PM 12/22/2021   12:28 PM 06/20/2021   11:22 AM 03/01/2021    8:55 AM  Depression screen PHQ 2/9  Decreased Interest 3 2 2 1 2   Down, Depressed, Hopeless 3 2 2 1 2   PHQ - 2 Score 6 4 4 2 4   Altered sleeping 3 3 3 3 2   Tired, decreased energy 3 2 3  0 2  Change in appetite 3 2 3  0 2  Feeling bad or failure about yourself  3 2 2  0 2  Trouble concentrating 3 2 2  0 2  Moving slowly or fidgety/restless 3 2 2  0 0  Suicidal thoughts 0 2 1 0 0  PHQ-9 Score 24 19 20 5 14     Today she reports no complaints. Headaches are beginning Contractions: Not present. Vag. Bleeding: None.  Movement: Absent. denies leaking of fluid. Review of Systems:   Pertinent items are noted in HPI Denies abnormal vaginal discharge w/ itching/odor/irritation, headaches, visual changes, shortness of breath, chest pain, abdominal pain, severe nausea/vomiting, or problems with urination or bowel movements unless otherwise stated above. Pertinent History Reviewed:  Reviewed past medical,surgical, social, obstetrical and family history.  Reviewed problem list, medications and allergies. Physical Assessment:   Vitals:   12/14/22 1155  BP: 123/74  Pulse: 86  Weight: 258 lb (117 kg)  Body mass index is 41.64 kg/m.        Physical Examination:   General appearance: Well appearing, and in no distress  Mental status: Alert, oriented to person, place, and time  Skin: Warm & dry  Cardiovascular: Normal heart rate noted  Respiratory: Normal respiratory effort, no distress  Abdomen: Soft, gravid, nontender  Pelvic: Cervical exam deferred          Extremities:    Fetal Status:     Movement: Absent    Chaperone: n/a    No results found for this or any previous visit (from the past 24 hour(s)).  Assessment & Plan:  1) Low-risk pregnancy [id number] at [redacted]w[redacted]d with an Estimated Date of Delivery: 06/01/23   2) Depression-->refilled zoloft 25 mg daily   Meds:  Meds ordered this encounter  Medications   sertraline (ZOLOFT) 25 MG tablet    Sig: Take 1 tablet (25 mg total) by mouth daily.    Dispense:  30 tablet    Refill:  11   Labs/procedures today: IT  Plan:  Continue routine obstetrical care  Next visit: prefers in person      Follow-up: Return in about 4 weeks (around 01/11/2023) for 20 week sono, LROB.  Orders Placed This Encounter  Procedures   INTEGRATED 2    Lazaro Arms, MD 12/14/2022 12:08 PM

## 2022-12-16 LAB — INTEGRATED 2
AFP MoM: 1.2
Alpha-Fetoprotein: 25 ng/mL
Crown Rump Length: 69.2 mm
DIA MoM: 0.85
DIA Value: 95.7 pg/mL
Estriol, Unconjugated: 0.99 ng/mL
Gest. Age on Collection Date: 13 weeks
Gestational Age: 16 weeks
Maternal Age at EDD: 25.2 yr
Nuchal Translucency (NT): 1.9 mm
Nuchal Translucency MoM: 1.21
Number of Fetuses: 1
PAPP-A MoM: 1.3
PAPP-A Value: 751.2 ng/mL
Test Results:: NEGATIVE
Weight: 270 [lb_av]
Weight: 270 [lb_av]
hCG MoM: 1.31
hCG Value: 31 IU/mL
uE3 MoM: 1.27

## 2023-01-08 ENCOUNTER — Encounter: Payer: Medicaid Other | Admitting: Women's Health

## 2023-01-08 ENCOUNTER — Other Ambulatory Visit: Payer: Medicaid Other

## 2023-01-10 ENCOUNTER — Ambulatory Visit (INDEPENDENT_AMBULATORY_CARE_PROVIDER_SITE_OTHER): Payer: Medicaid Other | Admitting: Obstetrics & Gynecology

## 2023-01-10 ENCOUNTER — Encounter: Payer: Self-pay | Admitting: Obstetrics & Gynecology

## 2023-01-10 ENCOUNTER — Ambulatory Visit (INDEPENDENT_AMBULATORY_CARE_PROVIDER_SITE_OTHER): Payer: Medicaid Other

## 2023-01-10 VITALS — BP 111/66 | HR 67 | Wt 264.8 lb

## 2023-01-10 DIAGNOSIS — Z3A19 19 weeks gestation of pregnancy: Secondary | ICD-10-CM | POA: Diagnosis not present

## 2023-01-10 DIAGNOSIS — Z363 Encounter for antenatal screening for malformations: Secondary | ICD-10-CM | POA: Diagnosis not present

## 2023-01-10 DIAGNOSIS — Z3480 Encounter for supervision of other normal pregnancy, unspecified trimester: Secondary | ICD-10-CM

## 2023-01-10 NOTE — Progress Notes (Signed)
[place] 19+5 wks,cephalic,FHR 157 bpm,CX 3.3 cm,posterior placenta gr 0,normal ovaries,SVP of fluid 5 cm,EFW 305 g 41%,limited view of NB because of fetal position,NB was visualized on prior ultrasound,no obvious abnormalities

## 2023-01-10 NOTE — Progress Notes (Signed)
LOW-RISK PREGNANCY VISIT Patient name: Leah Kennedy MRN [id number]  Date of birth: 1996-04-19 Chief Complaint:   Routine Prenatal Visit  History of Present Illness:   Leah Kennedy is a 27 y.o. [id number] female at [redacted]w[redacted]d with an Estimated Date of Delivery: 06/01/23 being seen today for ongoing management of a low-risk pregnancy.   Dep/anxiety- on low dose zoloft  Having issues with headaches, occasional nausea/vomiting.  Upon review of her history it is likely to be a caffeine headache      11/23/2022   11:25 AM 03/24/2022   12:19 PM 12/22/2021   12:28 PM 06/20/2021   11:22 AM 03/01/2021    8:55 AM  Depression screen PHQ 2/9  Decreased Interest 3 2 2 1 2   Down, Depressed, Hopeless 3 2 2 1 2   PHQ - 2 Score 6 4 4 2 4   Altered sleeping 3 3 3 3 2   Tired, decreased energy 3 2 3  0 2  Change in appetite 3 2 3  0 2  Feeling bad or failure about yourself  3 2 2  0 2  Trouble concentrating 3 2 2  0 2  Moving slowly or fidgety/restless 3 2 2  0 0  Suicidal thoughts 0 2 1 0 0  PHQ-9 Score 24 19 20 5 14      Contractions: Not present. Vag. Bleeding: None.  Movement: Present. denies leaking of fluid. Review of Systems:   Pertinent items are noted in HPI Denies abnormal vaginal discharge w/ itching/odor/irritation, visual changes, shortness of breath, chest pain, abdominal pain, severe nausea/vomiting, or problems with urination or bowel movements unless otherwise stated above. Pertinent History Reviewed:  Reviewed past medical,surgical, social, obstetrical and family history.  Reviewed problem list, medications and allergies.  Physical Assessment:   Vitals:   01/10/23 1005  BP: 111/66  Pulse: 67  Weight: 264 lb 12.8 oz (120.1 kg)  Body mass index is 42.74 kg/m.        Physical Examination:   General appearance: Well appearing, and in no distress  Mental status: Alert, oriented to person, place, and time  Skin: Warm & dry  Respiratory: Normal respiratory effort, no distress  Abdomen:  Soft, gravid, nontender  Pelvic: Cervical exam deferred         Extremities:    Psych:  mood and affect appropriate  Fetal Status:     Movement: Present  cephalic,FHR 157 bpm,CX 3.3 cm,posterior placenta gr 0,normal ovaries,SVP of fluid 5 cm,EFW 305 g 41%,limited view of NB because of fetal position,NB was visualized on prior ultrasound,no obvious abnormalities   Chaperone: n/a    No results found for this or any previous visit (from the past 24 hour(s)).   Assessment & Plan:  1) Low-risk pregnancy [id number] at [redacted]w[redacted]d with an Estimated Date of Delivery: 06/01/23   -Continue Zoloft -Normal anatomy scan today -Reviewed conservative treatment of headaches  Contraceptive counseling: Reviewed all options does not desire permanent sterilization.  Noted irregular bleeding with Liletta in the past, but wants to try Mirena   Meds: No orders of the defined types were placed in this encounter.  Labs/procedures today: Anatomy scan  Plan:  Continue routine obstetrical care  Next visit: prefers in person    Reviewed: Preterm labor symptoms and general obstetric precautions including but not limited to vaginal bleeding, contractions, leaking of fluid and fetal movement were reviewed in detail with the patient.  All questions were answered. Pt has home bp cuff. Check bp weekly, let us know if >140/90.   Follow-up: Return  in about 4 weeks (around 02/07/2023) for LROB visit.  No orders of the defined types were placed in this encounter.   Myna Hidalgo, DO Attending Obstetrician & Gynecologist, [facility] [employer], [facility]

## 2023-01-23 ENCOUNTER — Encounter: Payer: Self-pay | Admitting: Advanced Practice Midwife

## 2023-02-07 ENCOUNTER — Ambulatory Visit: Payer: Medicaid Other | Admitting: Obstetrics & Gynecology

## 2023-02-07 ENCOUNTER — Encounter: Payer: Self-pay | Admitting: Obstetrics & Gynecology

## 2023-02-07 VITALS — BP 105/68 | HR 70 | Wt 263.0 lb

## 2023-02-07 DIAGNOSIS — Z3A23 23 weeks gestation of pregnancy: Secondary | ICD-10-CM

## 2023-02-07 DIAGNOSIS — Z3480 Encounter for supervision of other normal pregnancy, unspecified trimester: Secondary | ICD-10-CM

## 2023-02-07 DIAGNOSIS — Z3482 Encounter for supervision of other normal pregnancy, second trimester: Secondary | ICD-10-CM

## 2023-02-07 NOTE — Progress Notes (Signed)
   LOW-RISK PREGNANCY VISIT Patient name: Leah Kennedy MRN [id number]  Date of birth: 01-15-1996 Chief Complaint:   Routine Prenatal Visit  History of Present Illness:   Leah Kennedy is a 27 y.o. [id number] female at [redacted]w[redacted]d with an Estimated Date of Delivery: 06/01/23 being seen today for ongoing management of a low-risk pregnancy.     11/23/2022   11:25 AM 03/24/2022   12:19 PM 12/22/2021   12:28 PM 06/20/2021   11:22 AM 03/01/2021    8:55 AM  Depression screen PHQ 2/9  Decreased Interest 3 2 2 1 2   Down, Depressed, Hopeless 3 2 2 1 2   PHQ - 2 Score 6 4 4 2 4   Altered sleeping 3 3 3 3 2   Tired, decreased energy 3 2 3  0 2  Change in appetite 3 2 3  0 2  Feeling bad or failure about yourself  3 2 2  0 2  Trouble concentrating 3 2 2  0 2  Moving slowly or fidgety/restless 3 2 2  0 0  Suicidal thoughts 0 2 1 0 0  PHQ-9 Score 24 19 20 5 14     Today she reports no complaints. Contractions: Not present. Vag. Bleeding: None.  Movement: Present. denies leaking of fluid. Review of Systems:   Pertinent items are noted in HPI Denies abnormal vaginal discharge w/ itching/odor/irritation, headaches, visual changes, shortness of breath, chest pain, abdominal pain, severe nausea/vomiting, or problems with urination or bowel movements unless otherwise stated above. Pertinent History Reviewed:  Reviewed past medical,surgical, social, obstetrical and family history.  Reviewed problem list, medications and allergies. Physical Assessment:   Vitals:   02/07/23 1032  BP: 105/68  Pulse: 70  Weight: 263 lb (119.3 kg)  Body mass index is 42.45 kg/m.        Physical Examination:   General appearance: Well appearing, and in no distress  Mental status: Alert, oriented to person, place, and time  Skin: Warm & dry  Cardiovascular: Normal heart rate noted  Respiratory: Normal respiratory effort, no distress  Abdomen: Soft, gravid, nontender  Pelvic: Cervical exam deferred         Extremities:    Fetal  Status:     Movement: Present    Chaperone: n/a    No results found for this or any previous visit (from the past 24 hour(s)).  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  Labs/procedures today:   Plan:  Continue routine obstetrical care  Next visit: prefers in person      Follow-up: Return in about 4 weeks (around 03/07/2023) for PN2.  No orders of the defined types were placed in this encounter.   Lazaro Arms, MD 02/07/2023 10:49 AM

## 2023-03-07 ENCOUNTER — Ambulatory Visit (INDEPENDENT_AMBULATORY_CARE_PROVIDER_SITE_OTHER): Payer: Medicaid Other | Admitting: Obstetrics and Gynecology

## 2023-03-07 ENCOUNTER — Encounter: Payer: Self-pay | Admitting: Obstetrics and Gynecology

## 2023-03-07 ENCOUNTER — Other Ambulatory Visit: Payer: Medicaid Other

## 2023-03-07 VITALS — BP 117/79 | HR 59 | Wt 266.0 lb

## 2023-03-07 DIAGNOSIS — Z131 Encounter for screening for diabetes mellitus: Secondary | ICD-10-CM

## 2023-03-07 DIAGNOSIS — Z3480 Encounter for supervision of other normal pregnancy, unspecified trimester: Secondary | ICD-10-CM | POA: Diagnosis not present

## 2023-03-07 DIAGNOSIS — Z3A27 27 weeks gestation of pregnancy: Secondary | ICD-10-CM | POA: Diagnosis not present

## 2023-03-07 DIAGNOSIS — F418 Other specified anxiety disorders: Secondary | ICD-10-CM

## 2023-03-07 LAB — POCT URINALYSIS DIPSTICK OB
Blood, UA: NEGATIVE
Glucose, UA: NEGATIVE
Ketones, UA: NEGATIVE
POC,PROTEIN,UA: NEGATIVE
Spec Grav, UA: 1.025 (ref 1.010–1.025)

## 2023-03-07 NOTE — Progress Notes (Signed)
   PRENATAL VISIT NOTE  Subjective:  Leah Kennedy is a 27 y.o. [phone] at [redacted]w[redacted]d being seen today for ongoing prenatal care.  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  Patient reports no complaints.  Contractions: Not present. Vag. Bleeding: None.  Movement: Present. Denies leaking of fluid.   The following portions of the patient's history were reviewed and updated as appropriate: allergies, current medications, past family history, past medical history, past social history, past surgical history and problem list.   Objective:   Vitals:   03/07/23 0903  BP: 117/79  Pulse: (!) 59  Weight: 266 lb (120.7 kg)    Fetal Status: Fetal Heart Rate (bpm): 141 Fundal Height: 28 cm Movement: Present     General:  Alert, oriented and cooperative. Patient is in no acute distress.  Skin: Skin is warm and dry. No rash noted.   Cardiovascular: Normal heart rate noted  Respiratory: Normal respiratory effort, no problems with respiration noted  Abdomen: Soft, gravid, appropriate for gestational age.  Pain/Pressure: Present     Pelvic: Cervical exam deferred        Extremities: Normal range of motion.  Edema: None  Mental Status: Normal mood and affect. Normal behavior. Normal judgment and thought content.   Assessment and Plan:  Pregnancy: [id number] at [redacted]w[redacted]d 1. Supervision of other normal pregnancy, antepartum BP and FHR normal Feeling regular fetal movement   2. Depression with anxiety Has been taking zoloft here and there. Unsure if she wants to take it.Discussed efficacy with daily use. She reports positive coping mechanisms and support and counseling. She would like to  wait hold off on the zoloft for now and start postpartum  3. [redacted] weeks gestation of pregnancy GTT and labs today   Preterm labor symptoms and general obstetric precautions including but not limited to vaginal bleeding, contractions, leaking of fluid and fetal movement were reviewed in detail with the patient. Please refer to After Visit Summary for other counseling recommendations.   Return in about 2 weeks (around 03/21/2023) for OB VISIT (MD or APP). No future appointments.   Albertine Grates, FNP

## 2023-03-07 NOTE — Addendum Note (Signed)
Addended by: Leola Brazil on: 03/07/2023 09:40 AM   Modules accepted: Orders

## 2023-03-09 LAB — URINE CULTURE, OB REFLEX

## 2023-03-09 LAB — CULTURE, OB URINE

## 2023-03-13 ENCOUNTER — Other Ambulatory Visit: Payer: Self-pay | Admitting: Obstetrics and Gynecology

## 2023-03-13 MED ORDER — FERROUS GLUCONATE 324 (38 FE) MG PO TABS: 324.0000 mg | ORAL_TABLET | Freq: Every day | ORAL | 3 refills | Status: DC

## 2023-03-21 ENCOUNTER — Ambulatory Visit: Payer: Medicaid Other | Admitting: Advanced Practice Midwife

## 2023-03-21 ENCOUNTER — Encounter: Payer: Self-pay | Admitting: Advanced Practice Midwife

## 2023-03-21 VITALS — BP 113/76 | HR 76 | Wt 268.0 lb

## 2023-03-21 DIAGNOSIS — Z3A29 29 weeks gestation of pregnancy: Secondary | ICD-10-CM

## 2023-03-21 DIAGNOSIS — Z3480 Encounter for supervision of other normal pregnancy, unspecified trimester: Secondary | ICD-10-CM

## 2023-03-21 DIAGNOSIS — F418 Other specified anxiety disorders: Secondary | ICD-10-CM

## 2023-03-21 DIAGNOSIS — Z23 Encounter for immunization: Secondary | ICD-10-CM

## 2023-03-21 DIAGNOSIS — Z3483 Encounter for supervision of other normal pregnancy, third trimester: Secondary | ICD-10-CM

## 2023-03-21 NOTE — Progress Notes (Signed)
   LOW-RISK PREGNANCY VISIT Patient name: Leah Kennedy MRN [id number]  Date of birth: Oct 04, 1995 Chief Complaint:   Routine Prenatal Visit (Tdap today)  History of Present Illness:   Leah Kennedy is a 27 y.o. [id number] female at [redacted]w[redacted]d with an Estimated Date of Delivery: 06/01/23 being seen today for ongoing management of a low-risk pregnancy.  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 Contractions: Not present.  .  Movement: Present. denies leaking of fluid. Review of Systems:   Pertinent items are noted in HPI Denies abnormal vaginal discharge w/ itching/odor/irritation, headaches, visual changes, shortness of breath, chest pain, abdominal pain, severe nausea/vomiting, or problems with urination or bowel movements unless otherwise stated above. Pertinent History Reviewed:  Reviewed past medical,surgical, social, obstetrical and family history.  Reviewed problem list, medications and allergies. Physical Assessment:   Vitals:   03/21/23 0908  BP: 113/76  Pulse: 76  Weight: 268 lb (121.6 kg)  Body mass index is 43.26 kg/m.        Physical Examination:   General appearance: Well appearing, and in no distress  Mental status: Alert, oriented to person, place, and time  Skin: Warm & dry  Cardiovascular: Normal heart rate noted  Respiratory: Normal respiratory effort, no distress  Abdomen: Soft, gravid, nontender  Pelvic: Cervical exam deferred         Extremities: Edema: None  Fetal Status: Fetal Heart Rate (bpm): 168 Fundal Height: 29 cm Movement: Present    No results found for this or any previous visit (from the past 24 hour(s)).  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  Labs/procedures today: Tdap  Plan:  Continue routine obstetrical care   Reviewed: Preterm labor symptoms and general obstetric precautions including but not limited to vaginal bleeding, contractions, leaking of fluid and fetal movement were reviewed in detail with the patient.  All questions were answered. Has home bp cuff. Check bp weekly, let us know if >140/90.   Follow-up: Return for As scheduled. (Has q 2wk visits scheduled)  Orders Placed This Encounter  Procedures   Tdap vaccine greater than or equal to 7yo IM   [name] CNM 03/21/2023 10:06 AM

## 2023-03-21 NOTE — Patient Instructions (Signed)
Leah Kennedy, thank you for choosing our office today! We appreciate the opportunity to meet your healthcare needs. You may receive a short survey by mail, e-mail, or through [employer]. If you are happy with your care we would appreciate if you could take just a few minutes to complete the survey questions. We read all of your comments and take your feedback very seriously. Thank you again for choosing our office.  Center for [employer] Team at [facility]  [facility] at [facility] ([address] [place], [place] [address]) Entrance C, located off of [address] Free 24/7 valet parking   CLASSES: Go to [email or web address] to register for classes (childbirth, breastfeeding, waterbirth, infant CPR, daddy bootcamp, etc.)  Call the office [phone]) or go to [facility] if: You begin to have strong, frequent contractions Your water breaks.  Sometimes it is a big gush of fluid, sometimes it is just a trickle that keeps getting your panties wet or running down your legs You have vaginal bleeding.  It is normal to have a small amount of spotting if your cervix was checked.  You don't feel your baby moving like normal.  If you don't, get you something to eat and drink and lay down and focus on feeling your baby move.   If your baby is still not moving like normal, you should call the office or go to [facility].  Call the office [phone]) or go to [facility] hospital for these signs of pre-eclampsia: Severe headache that does not go away with Tylenol Visual changes- seeing spots, double, blurred vision Pain under your right breast or upper abdomen that does not go away with Tums or heartburn medicine Nausea and/or vomiting Severe swelling in your hands, feet, and face   Tdap Vaccine It is recommended that you get the Tdap vaccine during the third trimester of EACH pregnancy to help protect your baby from getting pertussis (whooping cough) 27-36 weeks is the BEST time to do  this so that you can pass the protection on to your baby. During pregnancy is better than after pregnancy, but if you are unable to get it during pregnancy it will be offered at the hospital.  You can get this vaccine with [place], at the health department, your family doctor, or some local pharmacies Everyone who will be around your baby should also be up-to-date on their vaccines before the baby comes. Adults (who are not pregnant) only need 1 dose of Tdap during adulthood.   [facility] Pediatricians/Family Doctors [facility]): [address] Colette Ribas, [phone]           [facility]: [address], [phone]                [facility] Medicine [facility]): [address], [phone] (call to ask if accepting patients) [facility] Department: [address], [place], [phone]    [place] Pediatricians/Family Doctors [facility]): [phone] [address], [phone] [facility]: [address] [place], [phone] [facility]: [address], [phone]  [facility] Doctors  [facility]): [phone] [facility]: [address] [phone]   [facility] Doctors [facility]: [address], [phone]  [facility] Family Doctors  [facility]: [phone], [phone]  Home Blood Pressure Monitoring for Patients   Your provider has recommended that you check your  blood pressure (BP) at least once a week at home. If you do not have a blood pressure cuff at home, one will be provided for you. Contact your provider if you have not received your monitor within 1 week.   Helpful Tips for Accurate Home Blood Pressure Checks  Don't smoke, exercise, or drink caffeine 30 minutes before checking your BP Use the restroom before checking your BP (a full bladder can raise your  pressure) Relax in a comfortable upright chair Feet on the ground Left arm resting comfortably on a flat surface at the level of your heart Legs uncrossed Back supported Sit quietly and don't talk Place the cuff on your bare arm Adjust snuggly, so that only two fingertips can fit between your skin and the top of the cuff Check 2 readings separated by at least one minute Keep a log of your BP readings For a visual, please reference this diagram: http://ccnc.care/bpdiagram  Provider Name: [facility]     Phone: [phone]  Zone 1: ALL CLEAR  Continue to monitor your symptoms:  BP reading is less than 140 (top number) or less than 90 (bottom number)  No right upper stomach pain No headaches or seeing spots No feeling nauseated or throwing up No swelling in face and hands  Zone 2: CAUTION Call your doctor's office for any of the following:  BP reading is greater than 140 (top number) or greater than 90 (bottom number)  Stomach pain under your ribs in the middle or right side Headaches or seeing spots Feeling nauseated or throwing up Swelling in face and hands  Zone 3: EMERGENCY  Seek immediate medical care if you have any of the following:  BP reading is greater than160 (top number) or greater than 110 (bottom number) Severe headaches not improving with Tylenol Serious difficulty catching your breath Any worsening symptoms from Zone 2   Third Trimester of Pregnancy The third trimester is from week 29 through week 42, months 7 through 9. The third trimester is a time when the fetus is growing rapidly. At the end of the ninth month, the fetus is about 20 inches in length and weighs 6-10 pounds.  BODY CHANGES Your body goes through many changes during pregnancy. The changes vary from woman to woman.  Your weight will continue to increase. You can expect to gain 25-35 pounds (11-16 kg) by the end of the pregnancy. You may begin to get stretch marks on your hips, abdomen,  and breasts. You may urinate more often because the fetus is moving lower into your pelvis and pressing on your bladder. You may develop or continue to have heartburn as a result of your pregnancy. You may develop constipation because certain hormones are causing the muscles that push waste through your intestines to slow down. You may develop hemorrhoids or swollen, bulging veins (varicose veins). You may have pelvic pain because of the weight gain and pregnancy hormones relaxing your joints between the bones in your pelvis. Backaches may result from overexertion of the muscles supporting your posture. You may have changes in your hair. These can include thickening of your hair, rapid growth, and changes in texture. Some women also have hair loss during or after pregnancy, or hair that feels dry or thin. Your hair will most likely return to normal after your baby is born. Your breasts will continue to grow and be tender. A yellow discharge may leak from your breasts called colostrum. Your belly button may stick out. You may  feel short of breath because of your expanding uterus. You may notice the fetus "dropping," or moving lower in your abdomen. You may have a bloody mucus discharge. This usually occurs a few days to a week before labor begins. Your cervix becomes thin and soft (effaced) near your due date. WHAT TO EXPECT AT YOUR PRENATAL EXAMS  You will have prenatal exams every 2 weeks until week 36. Then, you will have weekly prenatal exams. During a routine prenatal visit: You will be weighed to make sure you and the fetus are growing normally. Your blood pressure is taken. Your abdomen will be measured to track your baby's growth. The fetal heartbeat will be listened to. Any test results from the previous visit will be discussed. You may have a cervical check near your due date to see if you have effaced. At around 36 weeks, your caregiver will check your cervix. At the same time, your  caregiver will also perform a test on the secretions of the vaginal tissue. This test is to determine if a type of bacteria, Group B streptococcus, is present. Your caregiver will explain this further. Your caregiver may ask you: What your birth plan is. How you are feeling. If you are feeling the baby move. If you have had any abnormal symptoms, such as leaking fluid, bleeding, severe headaches, or abdominal cramping. If you have any questions. Other tests or screenings that may be performed during your third trimester include: Blood tests that check for low iron levels (anemia). Fetal testing to check the health, activity level, and growth of the fetus. Testing is done if you have certain medical conditions or if there are problems during the pregnancy. FALSE LABOR You may feel small, irregular contractions that eventually go away. These are called Braxton Hicks contractions, or false labor. Contractions may last for hours, days, or even weeks before true labor sets in. If contractions come at regular intervals, intensify, or become painful, it is best to be seen by your caregiver.  SIGNS OF LABOR  Menstrual-like cramps. Contractions that are 5 minutes apart or less. Contractions that start on the top of the uterus and spread down to the lower abdomen and back. A sense of increased pelvic pressure or back pain. A watery or bloody mucus discharge that comes from the vagina. If you have any of these signs before the 37th week of pregnancy, call your caregiver right away. You need to go to the hospital to get checked immediately. HOME CARE INSTRUCTIONS  Avoid all smoking, herbs, alcohol, and unprescribed drugs. These chemicals affect the formation and growth of the baby. Follow your caregiver's instructions regarding medicine use. There are medicines that are either safe or unsafe to take during pregnancy. Exercise only as directed by your caregiver. Experiencing uterine cramps is a good sign to  stop exercising. Continue to eat regular, healthy meals. Wear a good support bra for breast tenderness. Do not use hot tubs, steam rooms, or saunas. Wear your seat belt at all times when driving. Avoid raw meat, uncooked cheese, cat litter boxes, and soil used by cats. These carry germs that can cause birth defects in the baby. Take your prenatal vitamins. Try taking a stool softener (if your caregiver approves) if you develop constipation. Eat more high-fiber foods, such as fresh vegetables or fruit and whole grains. Drink plenty of fluids to keep your urine clear or pale yellow. Take warm sitz baths to soothe any pain or discomfort caused by hemorrhoids. Use hemorrhoid cream if  your caregiver approves. If you develop varicose veins, wear support hose. Elevate your feet for 15 minutes, 3-4 times a day. Limit salt in your diet. Avoid heavy lifting, wear low heal shoes, and practice good posture. Rest a lot with your legs elevated if you have leg cramps or low back pain. Visit your dentist if you have not gone during your pregnancy. Use a soft toothbrush to brush your teeth and be gentle when you floss. A sexual relationship may be continued unless your caregiver directs you otherwise. Do not travel far distances unless it is absolutely necessary and only with the approval of your caregiver. Take prenatal classes to understand, practice, and ask questions about the labor and delivery. Make a trial run to the hospital. Pack your hospital bag. Prepare the baby's nursery. Continue to go to all your prenatal visits as directed by your caregiver. SEEK MEDICAL CARE IF: You are unsure if you are in labor or if your water has broken. You have dizziness. You have mild pelvic cramps, pelvic pressure, or nagging pain in your abdominal area. You have persistent nausea, vomiting, or diarrhea. You have a bad smelling vaginal discharge. You have pain with urination. SEEK IMMEDIATE MEDICAL CARE IF:  You  have a fever. You are leaking fluid from your vagina. You have spotting or bleeding from your vagina. You have severe abdominal cramping or pain. You have rapid weight loss or gain. You have shortness of breath with chest pain. You notice sudden or extreme swelling of your face, hands, ankles, feet, or legs. You have not felt your baby move in over an hour. You have severe headaches that do not go away with medicine. You have vision changes. Document Released: 07/04/2001 Document Revised: 07/15/2013 Document Reviewed: 09/10/2012 [facility] Patient Information 2015 [place], [place]. This information is not intended to replace advice given to you by your health care provider. Make sure you discuss any questions you have with your health care provider.

## 2023-03-28 ENCOUNTER — Encounter: Payer: Self-pay | Admitting: Obstetrics & Gynecology

## 2023-04-04 ENCOUNTER — Encounter: Payer: Self-pay | Admitting: Advanced Practice Midwife

## 2023-04-04 ENCOUNTER — Ambulatory Visit (INDEPENDENT_AMBULATORY_CARE_PROVIDER_SITE_OTHER): Payer: Medicaid Other | Admitting: Advanced Practice Midwife

## 2023-04-04 VITALS — BP 119/78 | HR 83 | Wt 268.0 lb

## 2023-04-04 DIAGNOSIS — O99210 Obesity complicating pregnancy, unspecified trimester: Secondary | ICD-10-CM

## 2023-04-04 DIAGNOSIS — O99213 Obesity complicating pregnancy, third trimester: Secondary | ICD-10-CM

## 2023-04-04 DIAGNOSIS — Z3480 Encounter for supervision of other normal pregnancy, unspecified trimester: Secondary | ICD-10-CM

## 2023-04-04 DIAGNOSIS — Z3A31 31 weeks gestation of pregnancy: Secondary | ICD-10-CM

## 2023-04-04 NOTE — Progress Notes (Signed)
   LOW-RISK PREGNANCY VISIT Patient name: Leah Kennedy MRN [id number]  Date of birth: 10/02/1995 Chief Complaint:   Routine Prenatal Visit  History of Present Illness:   Leah Kennedy is a 27 y.o. [id number] female at [redacted]w[redacted]d with an Estimated Date of Delivery: 06/01/23 being seen today for ongoing management of a low-risk pregnancy.  Today she reports  still w SPD; has been having difficulty sleeping . Contractions: Not present.  .  Movement: Present. denies leaking of fluid. Review of Systems:   Pertinent items are noted in HPI Denies abnormal vaginal discharge w/ itching/odor/irritation, headaches, visual changes, shortness of breath, chest pain, abdominal pain, severe nausea/vomiting, or problems with urination or bowel movements unless otherwise stated above. Pertinent History Reviewed:  Reviewed past medical,surgical, social, obstetrical and family history.  Reviewed problem list, medications and allergies. Physical Assessment:   Vitals:   04/04/23 0839  BP: 119/78  Pulse: 83  Weight: 268 lb (121.6 kg)  Body mass index is 43.26 kg/m.        Physical Examination:   General appearance: Well appearing, and in no distress  Mental status: Alert, oriented to person, place, and time  Skin: Warm & dry  Cardiovascular: Normal heart rate noted  Respiratory: Normal respiratory effort, no distress  Abdomen: Soft, gravid, nontender  Pelvic: Cervical exam deferred         Extremities: Edema: None  Fetal Status: Fetal Heart Rate (bpm): 154 Fundal Height: 32 cm Movement: Present    No results found for this or any previous visit (from the past 24 hour(s)).  Assessment & Plan:  1) Low-risk pregnancy [id number] at [redacted]w[redacted]d with an Estimated Date of Delivery: 06/01/23   2) SPD, uncomfortable but managing  3) Pre-g BMI 43, will get EFW @ 36wks and start weekly BPPs then; IOL 39-40wks (orders in and scheduled out)   Meds: No orders of the defined types were placed in this  encounter.  Labs/procedures today: none  Plan:  Continue routine obstetrical care   Reviewed: Preterm labor symptoms and general obstetric precautions including but not limited to vaginal bleeding, contractions, leaking of fluid and fetal movement were reviewed in detail with the patient.  All questions were answered. Has home bp cuff. Check bp weekly, let us know if >140/90.   Follow-up: Return for 2wk LROB; 4wk LROB and starting weekly BPP x 4 (add growth at 36wk BPP) .  Orders Placed This Encounter  Procedures   US OB Follow Up   US Fetal BPP W/O Non Stress   [name] [facility] 04/04/2023 9:18 AM

## 2023-04-04 NOTE — Patient Instructions (Signed)
Leah Kennedy, thank you for choosing our office today! We appreciate the opportunity to meet your healthcare needs. You may receive a short survey by mail, e-mail, or through [employer]. If you are happy with your care we would appreciate if you could take just a few minutes to complete the survey questions. We read all of your comments and take your feedback very seriously. Thank you again for choosing our office.  Center for [employer] Team at [facility]  [facility] at [facility] ([address] [place], [place] [address]) Entrance C, located off of [address] Free 24/7 valet parking   CLASSES: Go to [email or web address] to register for classes (childbirth, breastfeeding, waterbirth, infant CPR, daddy bootcamp, etc.)  Call the office [phone]) or go to [facility] if: You begin to have strong, frequent contractions Your water breaks.  Sometimes it is a big gush of fluid, sometimes it is just a trickle that keeps getting your panties wet or running down your legs You have vaginal bleeding.  It is normal to have a small amount of spotting if your cervix was checked.  You don't feel your baby moving like normal.  If you don't, get you something to eat and drink and lay down and focus on feeling your baby move.   If your baby is still not moving like normal, you should call the office or go to [facility].  Call the office [phone]) or go to [facility] hospital for these signs of pre-eclampsia: Severe headache that does not go away with Tylenol Visual changes- seeing spots, double, blurred vision Pain under your right breast or upper abdomen that does not go away with Tums or heartburn medicine Nausea and/or vomiting Severe swelling in your hands, feet, and face   Tdap Vaccine It is recommended that you get the Tdap vaccine during the third trimester of EACH pregnancy to help protect your baby from getting pertussis (whooping cough) 27-36 weeks is the BEST time to do  this so that you can pass the protection on to your baby. During pregnancy is better than after pregnancy, but if you are unable to get it during pregnancy it will be offered at the hospital.  You can get this vaccine with [place], at the health department, your family doctor, or some local pharmacies Everyone who will be around your baby should also be up-to-date on their vaccines before the baby comes. Adults (who are not pregnant) only need 1 dose of Tdap during adulthood.   [facility] Pediatricians/Family Doctors [facility]): [address] Colette Ribas, [phone]           [facility]: [address], [phone]                [facility] Medicine [facility]): [address], [phone] (call to ask if accepting patients) [facility] Department: [address], [place], [phone]    [place] Pediatricians/Family Doctors [facility]): [phone] [address], [phone] [facility]: [address] [place], [phone] [facility]: [address], [phone]  [facility] Doctors  [facility]): [phone] [facility]: [address] [phone]   [facility] Doctors [facility]: [address], [phone]  [facility] Family Doctors  [facility]: [phone], [phone]  Home Blood Pressure Monitoring for Patients   Your provider has recommended that you check your  blood pressure (BP) at least once a week at home. If you do not have a blood pressure cuff at home, one will be provided for you. Contact your provider if you have not received your monitor within 1 week.   Helpful Tips for Accurate Home Blood Pressure Checks  Don't smoke, exercise, or drink caffeine 30 minutes before checking your BP Use the restroom before checking your BP (a full bladder can raise your  pressure) Relax in a comfortable upright chair Feet on the ground Left arm resting comfortably on a flat surface at the level of your heart Legs uncrossed Back supported Sit quietly and don't talk Place the cuff on your bare arm Adjust snuggly, so that only two fingertips can fit between your skin and the top of the cuff Check 2 readings separated by at least one minute Keep a log of your BP readings For a visual, please reference this diagram: http://ccnc.care/bpdiagram  Provider Name: [facility]     Phone: [phone]  Zone 1: ALL CLEAR  Continue to monitor your symptoms:  BP reading is less than 140 (top number) or less than 90 (bottom number)  No right upper stomach pain No headaches or seeing spots No feeling nauseated or throwing up No swelling in face and hands  Zone 2: CAUTION Call your doctor's office for any of the following:  BP reading is greater than 140 (top number) or greater than 90 (bottom number)  Stomach pain under your ribs in the middle or right side Headaches or seeing spots Feeling nauseated or throwing up Swelling in face and hands  Zone 3: EMERGENCY  Seek immediate medical care if you have any of the following:  BP reading is greater than160 (top number) or greater than 110 (bottom number) Severe headaches not improving with Tylenol Serious difficulty catching your breath Any worsening symptoms from Zone 2   Third Trimester of Pregnancy The third trimester is from week 29 through week 42, months 7 through 9. The third trimester is a time when the fetus is growing rapidly. At the end of the ninth month, the fetus is about 20 inches in length and weighs 6-10 pounds.  BODY CHANGES Your body goes through many changes during pregnancy. The changes vary from woman to woman.  Your weight will continue to increase. You can expect to gain 25-35 pounds (11-16 kg) by the end of the pregnancy. You may begin to get stretch marks on your hips, abdomen,  and breasts. You may urinate more often because the fetus is moving lower into your pelvis and pressing on your bladder. You may develop or continue to have heartburn as a result of your pregnancy. You may develop constipation because certain hormones are causing the muscles that push waste through your intestines to slow down. You may develop hemorrhoids or swollen, bulging veins (varicose veins). You may have pelvic pain because of the weight gain and pregnancy hormones relaxing your joints between the bones in your pelvis. Backaches may result from overexertion of the muscles supporting your posture. You may have changes in your hair. These can include thickening of your hair, rapid growth, and changes in texture. Some women also have hair loss during or after pregnancy, or hair that feels dry or thin. Your hair will most likely return to normal after your baby is born. Your breasts will continue to grow and be tender. A yellow discharge may leak from your breasts called colostrum. Your belly button may stick out. You may  feel short of breath because of your expanding uterus. You may notice the fetus "dropping," or moving lower in your abdomen. You may have a bloody mucus discharge. This usually occurs a few days to a week before labor begins. Your cervix becomes thin and soft (effaced) near your due date. WHAT TO EXPECT AT YOUR PRENATAL EXAMS  You will have prenatal exams every 2 weeks until week 36. Then, you will have weekly prenatal exams. During a routine prenatal visit: You will be weighed to make sure you and the fetus are growing normally. Your blood pressure is taken. Your abdomen will be measured to track your baby's growth. The fetal heartbeat will be listened to. Any test results from the previous visit will be discussed. You may have a cervical check near your due date to see if you have effaced. At around 36 weeks, your caregiver will check your cervix. At the same time, your  caregiver will also perform a test on the secretions of the vaginal tissue. This test is to determine if a type of bacteria, Group B streptococcus, is present. Your caregiver will explain this further. Your caregiver may ask you: What your birth plan is. How you are feeling. If you are feeling the baby move. If you have had any abnormal symptoms, such as leaking fluid, bleeding, severe headaches, or abdominal cramping. If you have any questions. Other tests or screenings that may be performed during your third trimester include: Blood tests that check for low iron levels (anemia). Fetal testing to check the health, activity level, and growth of the fetus. Testing is done if you have certain medical conditions or if there are problems during the pregnancy. FALSE LABOR You may feel small, irregular contractions that eventually go away. These are called Braxton Hicks contractions, or false labor. Contractions may last for hours, days, or even weeks before true labor sets in. If contractions come at regular intervals, intensify, or become painful, it is best to be seen by your caregiver.  SIGNS OF LABOR  Menstrual-like cramps. Contractions that are 5 minutes apart or less. Contractions that start on the top of the uterus and spread down to the lower abdomen and back. A sense of increased pelvic pressure or back pain. A watery or bloody mucus discharge that comes from the vagina. If you have any of these signs before the 37th week of pregnancy, call your caregiver right away. You need to go to the hospital to get checked immediately. HOME CARE INSTRUCTIONS  Avoid all smoking, herbs, alcohol, and unprescribed drugs. These chemicals affect the formation and growth of the baby. Follow your caregiver's instructions regarding medicine use. There are medicines that are either safe or unsafe to take during pregnancy. Exercise only as directed by your caregiver. Experiencing uterine cramps is a good sign to  stop exercising. Continue to eat regular, healthy meals. Wear a good support bra for breast tenderness. Do not use hot tubs, steam rooms, or saunas. Wear your seat belt at all times when driving. Avoid raw meat, uncooked cheese, cat litter boxes, and soil used by cats. These carry germs that can cause birth defects in the baby. Take your prenatal vitamins. Try taking a stool softener (if your caregiver approves) if you develop constipation. Eat more high-fiber foods, such as fresh vegetables or fruit and whole grains. Drink plenty of fluids to keep your urine clear or pale yellow. Take warm sitz baths to soothe any pain or discomfort caused by hemorrhoids. Use hemorrhoid cream if  your caregiver approves. If you develop varicose veins, wear support hose. Elevate your feet for 15 minutes, 3-4 times a day. Limit salt in your diet. Avoid heavy lifting, wear low heal shoes, and practice good posture. Rest a lot with your legs elevated if you have leg cramps or low back pain. Visit your dentist if you have not gone during your pregnancy. Use a soft toothbrush to brush your teeth and be gentle when you floss. A sexual relationship may be continued unless your caregiver directs you otherwise. Do not travel far distances unless it is absolutely necessary and only with the approval of your caregiver. Take prenatal classes to understand, practice, and ask questions about the labor and delivery. Make a trial run to the hospital. Pack your hospital bag. Prepare the baby's nursery. Continue to go to all your prenatal visits as directed by your caregiver. SEEK MEDICAL CARE IF: You are unsure if you are in labor or if your water has broken. You have dizziness. You have mild pelvic cramps, pelvic pressure, or nagging pain in your abdominal area. You have persistent nausea, vomiting, or diarrhea. You have a bad smelling vaginal discharge. You have pain with urination. SEEK IMMEDIATE MEDICAL CARE IF:  You  have a fever. You are leaking fluid from your vagina. You have spotting or bleeding from your vagina. You have severe abdominal cramping or pain. You have rapid weight loss or gain. You have shortness of breath with chest pain. You notice sudden or extreme swelling of your face, hands, ankles, feet, or legs. You have not felt your baby move in over an hour. You have severe headaches that do not go away with medicine. You have vision changes. Document Released: 07/04/2001 Document Revised: 07/15/2013 Document Reviewed: 09/10/2012 [facility] Patient Information 2015 [place], [place]. This information is not intended to replace advice given to you by your health care provider. Make sure you discuss any questions you have with your health care provider.

## 2023-04-09 DIAGNOSIS — Z3A32 32 weeks gestation of pregnancy: Secondary | ICD-10-CM | POA: Diagnosis not present

## 2023-04-09 DIAGNOSIS — R1013 Epigastric pain: Secondary | ICD-10-CM | POA: Diagnosis not present

## 2023-04-09 DIAGNOSIS — O26899 Other specified pregnancy related conditions, unspecified trimester: Secondary | ICD-10-CM | POA: Diagnosis not present

## 2023-04-18 ENCOUNTER — Encounter: Payer: Self-pay | Admitting: Advanced Practice Midwife

## 2023-04-18 ENCOUNTER — Ambulatory Visit: Payer: Medicaid Other | Admitting: Advanced Practice Midwife

## 2023-04-18 VITALS — BP 127/81 | HR 111 | Wt 270.0 lb

## 2023-04-18 DIAGNOSIS — Z3480 Encounter for supervision of other normal pregnancy, unspecified trimester: Secondary | ICD-10-CM

## 2023-04-18 DIAGNOSIS — Z3483 Encounter for supervision of other normal pregnancy, third trimester: Secondary | ICD-10-CM

## 2023-04-18 DIAGNOSIS — Z3A33 33 weeks gestation of pregnancy: Secondary | ICD-10-CM

## 2023-04-18 NOTE — Progress Notes (Signed)
LOW-RISK PREGNANCY VISIT Patient name: Leah Kennedy MRN [id number]  Date of birth: 06-22-1996 Chief Complaint:   Routine Prenatal Visit  History of Present Illness:   Leah Kennedy is a 27 y.o. [id number] female at [redacted]w[redacted]d with an Estimated Date of Delivery: 06/01/23 being seen today for ongoing management of a low-risk pregnancy.  Today she reports  feeling overall uncomfortable . Contractions: Not present. Vag. Bleeding: None.  Movement: Present. denies leaking of fluid. Review of Systems:   Pertinent items are noted in HPI Denies abnormal vaginal discharge w/ itching/odor/irritation, headaches, visual changes, shortness of breath, chest pain, abdominal pain, severe nausea/vomiting, or problems with urination or bowel movements unless otherwise stated above. Pertinent History Reviewed:  Reviewed past medical,surgical, social, obstetrical and family history.  Reviewed problem list, medications and allergies. Physical Assessment:   Vitals:   04/18/23 0835  BP: 127/81  Pulse: (!) 111  Weight: 270 lb (122.5 kg)  Body mass index is 43.58 kg/m.        Physical Examination:   General appearance: Well appearing, and in no distress  Mental status: Alert, oriented to person, place, and time  Skin: Warm & dry  Cardiovascular: Normal heart rate noted  Respiratory: Normal respiratory effort, no distress  Abdomen: Soft, gravid, nontender  Pelvic: Cervical exam deferred         Extremities: Edema: None  Fetal Status: Fetal Heart Rate (bpm): 146 Fundal Height: 33 cm Movement: Present    No results found for this or any previous visit (from the past 24 hour(s)).  Assessment & Plan:  1) Low-risk pregnancy [id number] at [redacted]w[redacted]d with an Estimated Date of Delivery: 06/01/23   2) BMI 43, has BPP/growth at next visit and then weekly BPPs after; IOL 39-40wks  3) Wants Mirena @ PP visit, pamphlet given today with discussion   Meds: No orders of the defined types were placed in this  encounter.  Labs/procedures today: none   Plan:  Continue routine obstetrical care   Reviewed: Preterm labor symptoms and general obstetric precautions including but not limited to vaginal bleeding, contractions, leaking of fluid and fetal movement were reviewed in detail with the patient.  All questions were answered. Has home bp cuff.  Check bp weekly, let us know if >140/90.   Follow-up: Return for as scheduled (GBS/cultures @ [place]);  (add weekly BPP/visits x 3 after next visit).  No orders of the defined types were placed in this encounter.  [name] CNM 04/18/2023 9:15 AM

## 2023-04-20 ENCOUNTER — Encounter: Payer: Self-pay | Admitting: Advanced Practice Midwife

## 2023-05-02 ENCOUNTER — Ambulatory Visit (INDEPENDENT_AMBULATORY_CARE_PROVIDER_SITE_OTHER): Payer: Medicaid Other | Admitting: Radiology

## 2023-05-02 ENCOUNTER — Ambulatory Visit (INDEPENDENT_AMBULATORY_CARE_PROVIDER_SITE_OTHER): Payer: Medicaid Other | Admitting: Obstetrics & Gynecology

## 2023-05-02 VITALS — BP 123/78 | HR 71 | Wt 272.0 lb

## 2023-05-02 DIAGNOSIS — O99210 Obesity complicating pregnancy, unspecified trimester: Secondary | ICD-10-CM

## 2023-05-02 DIAGNOSIS — O99213 Obesity complicating pregnancy, third trimester: Secondary | ICD-10-CM | POA: Diagnosis not present

## 2023-05-02 DIAGNOSIS — Z3A35 35 weeks gestation of pregnancy: Secondary | ICD-10-CM | POA: Diagnosis not present

## 2023-05-02 DIAGNOSIS — Z3480 Encounter for supervision of other normal pregnancy, unspecified trimester: Secondary | ICD-10-CM

## 2023-05-02 DIAGNOSIS — Z3483 Encounter for supervision of other normal pregnancy, third trimester: Secondary | ICD-10-CM

## 2023-05-02 NOTE — Progress Notes (Signed)
[place] = 35+2   EFW 41% Single active fetus,  Cephalic,   FHR = 138 bpm BPP = 8/8 Posterior placenta,  gr 2,  AFI = 14.2 cm  52%   SVP = 5.6 cm

## 2023-05-02 NOTE — Progress Notes (Signed)
LOW-RISK PREGNANCY VISIT Patient name: Leah Kennedy MRN [id number]  Date of birth: [birth year] Chief Complaint:   Routine Prenatal Visit  History of Present Illness:   Leah Kennedy is a 26 y.o. [id number] female at [redacted]w[redacted]d with an Estimated Date of Delivery: 06/01/23 being seen today for ongoing management of a low-risk pregnancy.   -morbid obesity    11/23/2022   11:25 AM 03/24/2022   12:19 PM 12/22/2021   12:28 PM 06/20/2021   11:22 AM 03/01/2021    8:55 AM  Depression screen PHQ 2/9  Decreased Interest 3 2 2 1 2   Down, Depressed, Hopeless 3 2 2 1 2   PHQ - 2 Score 6 4 4 2 4   Altered sleeping 3 3 3 3 2   Tired, decreased energy 3 2 3  0 2  Change in appetite 3 2 3  0 2  Feeling bad or failure about yourself  3 2 2  0 2  Trouble concentrating 3 2 2  0 2  Moving slowly or fidgety/restless 3 2 2  0 0  Suicidal thoughts 0 2 1 0 0  PHQ-9 Score 24 19 20 5 14     Today she reports bleeding. Contractions: Not present. Vag. Bleeding: None.  Movement: Present. denies leaking of fluid. Review of Systems:   Pertinent items are noted in HPI Denies abnormal vaginal discharge w/ itching/odor/irritation, headaches, visual changes, shortness of breath, chest pain, abdominal pain, severe nausea/vomiting, or problems with urination or bowel movements unless otherwise stated above. Pertinent History Reviewed:  Reviewed past medical,surgical, social, obstetrical and family history.  Reviewed problem list, medications and allergies.  Physical Assessment:   Vitals:   05/02/23 1054  BP: 123/78  Pulse: 71  Weight: 272 lb (123.4 kg)  Body mass index is 43.9 kg/m.        Physical Examination:   General appearance: Well appearing, and in no distress  Mental status: Alert, oriented to person, place, and time  Skin: Warm & dry  Respiratory: Normal respiratory effort, no distress  Abdomen: Soft, gravid, nontender  Pelvic: Cervical exam deferred         Extremities:  no edema  Psych:  mood and affect  appropriate  Fetal Status:     Movement: Present    EFW 41% Single active fetus,  Cephalic,   FHR = 138 bpm BPP = 8/8 Posterior placenta,  gr 2,  AFI = 14.2 cm  52%   SVP = 5.6 cm  Chaperone: n/a    No results found for this or any previous visit (from the past 24 hour(s)).   Assessment & Plan:  1) Low-risk pregnancy [id number] at [redacted]w[redacted]d with an Estimated Date of Delivery: 06/01/23   2) Morbid obesity Scheduled for IOL @ 40wks- 11/8 daytime Hopeful that pt will go into labor on her own   []  GBS testing next visit  Meds: No orders of the defined types were placed in this encounter.  Labs/procedures today: growth scan- AGA  Plan:  Continue routine obstetrical care  Next visit: prefers in person    Reviewed: Preterm labor symptoms and general obstetric precautions including but not limited to vaginal bleeding, contractions, leaking of fluid and fetal movement were reviewed in detail with the patient.  All questions were answered.   Follow-up: Return in about 1 week (around 05/09/2023) for LROB visit.  No orders of the defined types were placed in this encounter.   Myna Hidalgo, DO Attending Obstetrician & Gynecologist, [facility] for [employer], [facility]  [facility]

## 2023-05-09 ENCOUNTER — Ambulatory Visit (INDEPENDENT_AMBULATORY_CARE_PROVIDER_SITE_OTHER): Payer: Medicaid Other | Admitting: Obstetrics and Gynecology

## 2023-05-09 ENCOUNTER — Telehealth: Payer: Self-pay

## 2023-05-09 ENCOUNTER — Other Ambulatory Visit (HOSPITAL_COMMUNITY)
Admission: RE | Admit: 2023-05-09 | Discharge: 2023-05-09 | Disposition: A | Discharge status: Home / Self Care | Payer: Medicaid Other | Source: Ambulatory Visit | Attending: Obstetrics and Gynecology | Admitting: Obstetrics and Gynecology

## 2023-05-09 ENCOUNTER — Other Ambulatory Visit: Payer: Medicaid Other

## 2023-05-09 VITALS — BP 131/82 | HR 110 | Wt 278.0 lb

## 2023-05-09 DIAGNOSIS — Z3483 Encounter for supervision of other normal pregnancy, third trimester: Secondary | ICD-10-CM | POA: Diagnosis not present

## 2023-05-09 DIAGNOSIS — O99213 Obesity complicating pregnancy, third trimester: Secondary | ICD-10-CM

## 2023-05-09 DIAGNOSIS — Z3480 Encounter for supervision of other normal pregnancy, unspecified trimester: Secondary | ICD-10-CM

## 2023-05-09 DIAGNOSIS — Z3A36 36 weeks gestation of pregnancy: Secondary | ICD-10-CM | POA: Diagnosis not present

## 2023-05-09 DIAGNOSIS — Z8659 Personal history of other mental and behavioral disorders: Secondary | ICD-10-CM

## 2023-05-09 DIAGNOSIS — O99210 Obesity complicating pregnancy, unspecified trimester: Secondary | ICD-10-CM

## 2023-05-09 NOTE — Progress Notes (Signed)
   PRENATAL VISIT NOTE  Subjective:  Leah Kennedy is a 27 y.o. [phone] at [redacted]w[redacted]d being seen today for ongoing prenatal care.  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  Patient reports no complaints.  Contractions: Irritability. Vag. Bleeding: None.  Movement: Present. Denies leaking of fluid.   The following portions of the patient's history were reviewed and updated as appropriate: allergies, current medications, past family history, past medical history, past social history, past surgical history and problem list.   Objective:   Vitals:   05/09/23 0907  BP: 131/82  Pulse: (!) 110  Weight: 278 lb (126.1 kg)    Fetal Status: Fetal Heart Rate (bpm): 138 Fundal Height: 36 cm Movement: Present  Presentation: Vertex  General:  Alert, oriented and cooperative. Patient is in no acute distress.  Skin: Skin is warm and dry. No rash noted.   Cardiovascular: Normal heart rate noted  Respiratory: Normal respiratory effort, no problems with respiration noted  Abdomen: Soft, gravid, appropriate for gestational age.  Pain/Pressure: Present     Pelvic: Cervical exam performed in the presence of a chaperone Dilation: 1 Effacement (%): Thick Station: -3  Extremities: Normal range of motion.     Mental Status: Normal mood and affect. Normal behavior. Normal judgment and thought content.   Assessment and Plan:  Pregnancy: [id number] at [redacted]w[redacted]d 1. Encounter for supervision of other normal pregnancy, third trimester BP and FHR normal Feeling regular movement - Cervicovaginal ancillary only( [place]) - Culture, beta strep (group b only)  2. [redacted] weeks gestation of  pregnancy Swabs today IOL scheduled 11/8 Taking oral iron repeat CBC today  - Cervicovaginal ancillary only( ) - Culture, beta strep (group b only)   Preterm labor symptoms and general obstetric precautions including but not limited to vaginal bleeding, contractions, leaking of fluid and fetal movement were reviewed in detail with the patient. Please refer to After Visit Summary for other counseling recommendations.   Return in one week for routine prenatal   Future Appointments  Date Time Provider Department Center  05/16/2023  8:30 AM [facility] - FTOBGYN [place] [facility] None  05/16/2023  9:30 AM Cheral Marker, CNM [facility] FTOBGYN  05/23/2023  9:15 AM [facility] - FTOBGYN [place] [facility] None  05/23/2023 10:10 AM Cheral Marker, CNM [facility] FTOBGYN  05/30/2023  9:15 AM [facility] - FTOBGYN [place] [facility] None  05/30/2023 10:10 AM [name], CNM [facility] FTOBGYN  06/01/2023  6:30 AM [facility]-LD SCHED ROOM [facility]INDC None    Albertine Grates, FNP

## 2023-05-09 NOTE — Telephone Encounter (Signed)
Patient called at 4:30pm stating that she has been cramping every 10-15 minutes since her appointment this morning when her cervix was checked. Patient was 1 cm at her appointment today. Patient denies bleeding and leaking of fluid. Patient states she might have some pink tinged gel from exam when she wipes. Patient advised to stay hydrated and rest. Patient given labor precautions: go to MAU if leaking fluid, bleeding, decreased fetal movement, or if cramping intensifies to every 5 minutes.

## 2023-05-09 NOTE — Progress Notes (Signed)
[place] 36+5 wks,cephalic,BPP 8/8,posterior placenta gr 2,FHR 124 bpm,AFI 12.4 cm

## 2023-05-10 ENCOUNTER — Encounter: Payer: Self-pay | Admitting: Obstetrics & Gynecology

## 2023-05-10 LAB — CBC
Hematocrit: 33.1 % — ABNORMAL LOW (ref 34.0–46.6)
Hemoglobin: 10.1 g/dL — ABNORMAL LOW (ref 11.1–15.9)
MCH: 25.3 pg — ABNORMAL LOW (ref 26.6–33.0)
MCHC: 30.5 g/dL — ABNORMAL LOW (ref 31.5–35.7)
MCV: 83 fL (ref 79–97)
Platelets: 252 10*3/uL (ref 150–450)
RBC: 3.99 x10E6/uL (ref 3.77–5.28)
RDW: 13.5 % (ref 11.7–15.4)
WBC: 9.1 10*3/uL (ref 3.4–10.8)

## 2023-05-10 LAB — CERVICOVAGINAL ANCILLARY ONLY
Chlamydia: NEGATIVE
Comment: NEGATIVE
Comment: NORMAL
Neisseria Gonorrhea: NEGATIVE

## 2023-05-13 LAB — CULTURE, BETA STREP (GROUP B ONLY): Strep Gp B Culture: NEGATIVE

## 2023-05-16 ENCOUNTER — Encounter: Payer: Self-pay | Admitting: Women's Health

## 2023-05-16 ENCOUNTER — Ambulatory Visit (INDEPENDENT_AMBULATORY_CARE_PROVIDER_SITE_OTHER): Payer: Medicaid Other | Admitting: Women's Health

## 2023-05-16 ENCOUNTER — Other Ambulatory Visit (INDEPENDENT_AMBULATORY_CARE_PROVIDER_SITE_OTHER): Payer: Medicaid Other

## 2023-05-16 VITALS — BP 130/83 | HR 84 | Wt 276.0 lb

## 2023-05-16 DIAGNOSIS — Z3480 Encounter for supervision of other normal pregnancy, unspecified trimester: Secondary | ICD-10-CM

## 2023-05-16 DIAGNOSIS — O99210 Obesity complicating pregnancy, unspecified trimester: Secondary | ICD-10-CM

## 2023-05-16 DIAGNOSIS — O0993 Supervision of high risk pregnancy, unspecified, third trimester: Secondary | ICD-10-CM

## 2023-05-16 DIAGNOSIS — O99213 Obesity complicating pregnancy, third trimester: Secondary | ICD-10-CM

## 2023-05-16 DIAGNOSIS — Z3A37 37 weeks gestation of pregnancy: Secondary | ICD-10-CM

## 2023-05-16 DIAGNOSIS — Z6841 Body Mass Index (BMI) 40.0 and over, adult: Secondary | ICD-10-CM

## 2023-05-16 DIAGNOSIS — Z8759 Personal history of other complications of pregnancy, childbirth and the puerperium: Secondary | ICD-10-CM

## 2023-05-16 NOTE — Progress Notes (Signed)
[place] 37+5 wks,cephalic,BPP 8/8,FHR 157 bpm,posterior placenta gr 3,AFI 15 cm

## 2023-05-16 NOTE — Progress Notes (Signed)
HIGH-RISK PREGNANCY VISIT Patient name: Leah Kennedy MRN [id number]  Date of birth: Dec 16, 1995 Chief Complaint:   Routine Prenatal Visit  History of Present Illness:   Leah Kennedy is a 27 y.o. [id number] female at [redacted]w[redacted]d with an Estimated Date of Delivery: 06/01/23 being seen today for ongoing management of a high-risk pregnancy complicated by PG BMI >=40.    Today she reports  contractions since last night, hasn't started timing. Feels like she has felt in past when labor was near . Wants waterbirth, took class.  Contractions: Irregular. Vag. Bleeding: None.  Movement: Present. denies leaking of fluid.      11/23/2022   11:25 AM 03/24/2022   12:19 PM 12/22/2021   12:28 PM 06/20/2021   11:22 AM 03/01/2021    8:55 AM  Depression screen PHQ 2/9  Decreased Interest 3 2 2 1 2   Down, Depressed, Hopeless 3 2 2 1 2   PHQ - 2 Score 6 4 4 2 4   Altered sleeping 3 3 3 3 2   Tired, decreased energy 3 2 3  0 2  Change in appetite 3 2 3  0 2  Feeling bad or failure about yourself  3 2 2  0 2  Trouble concentrating 3 2 2  0 2  Moving slowly or fidgety/restless 3 2 2  0 0  Suicidal thoughts 0 2 1 0 0  PHQ-9 Score 24 19 20 5 14         11/23/2022   11:25 AM 03/24/2022   12:21 PM 06/20/2021   11:23 AM 03/01/2021    8:56 AM  GAD 7 : Generalized Anxiety Score  Nervous, Anxious, on Edge 3 2 0 2  Control/stop worrying 3 2 0 2  Worry too much - different things 3 2 0 2  Trouble relaxing 3 2 1 2   Restless 3 2 0 2  Easily annoyed or irritable 2 1 0 2  Afraid - awful might happen 2 0 0 2  Total GAD 7 Score 19 11 1 14      Review of Systems:   Pertinent items are noted in HPI Denies abnormal vaginal discharge w/ itching/odor/irritation, headaches, visual changes, shortness of breath, chest pain, abdominal pain, severe nausea/vomiting, or problems with urination or bowel movements unless otherwise stated above. Pertinent History Reviewed:  Reviewed past medical,surgical, social, obstetrical and family history.   Reviewed problem list, medications and allergies. Physical Assessment:   Vitals:   05/16/23 0909  BP: 130/83  Pulse: 84  Weight: 276 lb (125.2 kg)  Body mass index is 44.55 kg/m.           Physical Examination:   General appearance: alert, well appearing, and in no distress  Mental status: alert, oriented to person, place, and time  Skin: warm & dry   Extremities:      Cardiovascular: normal heart rate noted  Respiratory: normal respiratory effort, no distress  Abdomen: gravid, soft, non-tender  Pelvic: Cervical exam performed  Dilation: 2.5 Effacement (%): Thick Station: Ballotable  Fetal Status: Fetal Heart Rate (bpm): +u/s   Movement: Present Presentation: Vertex  Fetal Surveillance Testing today: [place] 37+5 wks,cephalic,BPP 8/8,FHR 157 bpm,posterior placenta gr 3,AFI 15 cm   Chaperone: Faith Rogue    No results found for this or any previous visit (from the past 24 hour(s)).  Assessment & Plan:  High-risk pregnancy: [id number] at [redacted]w[redacted]d with an Estimated Date of Delivery: 06/01/23   1) PG BMI >=40, currently 44, IOL as scheduled 11/8  2) Interested in waterbirth, took class, to  send certificate via MyChart, understands providers at hospital will determine eligibility  Meds: No orders of the defined types were placed in this encounter.   Labs/procedures today: SVE and U/S  Treatment Plan:  IOL as scheduled 11/8  Reviewed: Term labor symptoms and general obstetric precautions including but not limited to vaginal bleeding, contractions, leaking of fluid and fetal movement were reviewed in detail with the patient.  All questions were answered. Does have home bp cuff. Office bp cuff given: not applicable. Check bp daily, let us know if consistently >140 and/or >90.  Follow-up: No follow-ups on file.   Future Appointments  Date Time Provider Department Center  05/23/2023  9:15 AM [facility] - FTOBGYN [place] [facility] None  05/23/2023 10:10 AM Cheral Marker, CNM [facility]FT FTOBGYN   05/30/2023  9:15 AM [facility] - FTOBGYN [place] [facility] None  05/30/2023 10:10 AM [name], CNM [facility]FT FTOBGYN  06/01/2023  6:30 AM MC-LD SCHED ROOM [facility]-INDC None    No orders of the defined types were placed in this encounter.  Cheral Marker CNM, [facility] 05/16/2023 10:03 AM

## 2023-05-16 NOTE — Patient Instructions (Signed)
Leah Kennedy, thank you for choosing our office today! We appreciate the opportunity to meet your healthcare needs. You may receive a short survey by mail, e-mail, or through [employer]. If you are happy with your care we would appreciate if you could take just a few minutes to complete the survey questions. We read all of your comments and take your feedback very seriously. Thank you again for choosing our office.  Center for [employer] Team at [facility]  [facility] at [facility] ([address] [place], [place] [address]) Entrance C, located off of [address] Free 24/7 valet parking   CLASSES: Go to [email or web address] to register for classes (childbirth, breastfeeding, waterbirth, infant CPR, daddy bootcamp, etc.)  Call the office [phone]) or go to [facility] if: You begin to have strong, frequent contractions Your water breaks.  Sometimes it is a big gush of fluid, sometimes it is just a trickle that keeps getting your panties wet or running down your legs You have vaginal bleeding.  It is normal to have a small amount of spotting if your cervix was checked.  You don't feel your baby moving like normal.  If you don't, get you something to eat and drink and lay down and focus on feeling your baby move.   If your baby is still not moving like normal, you should call the office or go to [facility].  Call the office [phone]) or go to [facility] hospital for these signs of pre-eclampsia: Severe headache that does not go away with Tylenol Visual changes- seeing spots, double, blurred vision Pain under your right breast or upper abdomen that does not go away with Tums or heartburn medicine Nausea and/or vomiting Severe swelling in your hands, feet, and face   [facility] Pediatricians/Family Doctors [facility]): [address] Colette Ribas, [phone]           [facility]: [address], [phone]                 [facility]): [address], [phone] (call to ask if accepting patients) [facility] Department: [address], [place], [phone]    [facility] Pediatricians/Family Doctors [facility]): [address], [phone] [facility]: [address] [place], [phone] [facility]: [address], [phone]  [facility] Doctors  [facility]): [phone] [facility]: [address] [phone]   [facility] Doctors [facility]: [address], [phone]  [facility] Doctors  [facility]: [phone], [phone]  Home Blood Pressure Monitoring for Patients   Your provider has recommended that you check your blood pressure (BP) at least once a week at home. If you do not have a blood pressure cuff at home, one will be provided for you. Contact your provider if you have not received your monitor within 1 week.   Helpful Tips for Accurate Home Blood Pressure Checks  Don't smoke, exercise, or drink caffeine 30 minutes before checking your BP Use the restroom before checking your BP (a full bladder can raise your pressure) Relax in a comfortable upright chair Feet on the ground Left arm resting comfortably on a flat surface at the level of your heart Legs uncrossed Back supported Sit quietly and don't talk Place the cuff on your bare arm Adjust snuggly, so that only two fingertips  can fit between your skin and the top of the cuff Check 2 readings separated by at least one minute Keep a log of your BP readings For a visual, please reference this diagram: http://ccnc.care/bpdiagram  Provider Name: [facility]     Phone: [phone]  Zone 1: ALL CLEAR  Continue to monitor your symptoms:  BP reading is less than 140 (top number) or less than 90 (bottom number)  No right  upper stomach pain No headaches or seeing spots No feeling nauseated or throwing up No swelling in face and hands  Zone 2: CAUTION Call your doctor's office for any of the following:  BP reading is greater than 140 (top number) or greater than 90 (bottom number)  Stomach pain under your ribs in the middle or right side Headaches or seeing spots Feeling nauseated or throwing up Swelling in face and hands  Zone 3: EMERGENCY  Seek immediate medical care if you have any of the following:  BP reading is greater than160 (top number) or greater than 110 (bottom number) Severe headaches not improving with Tylenol Serious difficulty catching your breath Any worsening symptoms from Zone 2   Braxton Hicks Contractions Contractions of the uterus can occur throughout pregnancy, but they are not always a sign that you are in labor. You may have practice contractions called Braxton Hicks contractions. These false labor contractions are sometimes confused with true labor. What are Deberah Pelton contractions? Braxton Hicks contractions are tightening movements that occur in the muscles of the uterus before labor. Unlike true labor contractions, these contractions do not result in opening (dilation) and thinning of the cervix. Toward the end of pregnancy (32-34 weeks), Braxton Hicks contractions can happen more often and may become stronger. These contractions are sometimes difficult to tell apart from true labor because they can be very uncomfortable. You should not feel embarrassed if you go to the hospital with false labor. Sometimes, the only way to tell if you are in true labor is for your health care provider to look for changes in the cervix. The health care provider will do a physical exam and may monitor your contractions. If you are not in true labor, the exam should show that your cervix is not dilating and your water has not broken. If there are no other health problems associated with your  pregnancy, it is completely safe for you to be sent home with false labor. You may continue to have Braxton Hicks contractions until you go into true labor. How to tell the difference between true labor and false labor True labor Contractions last 30-70 seconds. Contractions become very regular. Discomfort is usually felt in the top of the uterus, and it spreads to the lower abdomen and low back. Contractions do not go away with walking. Contractions usually become more intense and increase in frequency. The cervix dilates and gets thinner. False labor Contractions are usually shorter and not as strong as true labor contractions. Contractions are usually irregular. Contractions are often felt in the front of the lower abdomen and in the groin. Contractions may go away when you walk around or change positions while lying down. Contractions get weaker and are shorter-lasting as time goes on. The cervix usually does not dilate or become thin. Follow these instructions at home:  Take over-the-counter and prescription medicines only as told by your health care provider. Keep up with your usual exercises and follow other instructions from your health care provider. Eat and drink lightly if you think  you are going into labor. If Braxton Hicks contractions are making you uncomfortable: Change your position from lying down or resting to walking, or change from walking to resting. Sit and rest in a tub of warm water. Drink enough fluid to keep your urine pale yellow. Dehydration may cause these contractions. Do slow and deep breathing several times an hour. Keep all follow-up prenatal visits as told by your health care provider. This is important. Contact a health care provider if: You have a fever. You have continuous pain in your abdomen. Get help right away if: Your contractions become stronger, more regular, and closer together. You have fluid leaking or gushing from your vagina. You pass  blood-tinged mucus (bloody show). You have bleeding from your vagina. You have low back pain that you never had before. You feel your baby's head pushing down and causing pelvic pressure. Your baby is not moving inside you as much as it used to. Summary Contractions that occur before labor are called Braxton Hicks contractions, false labor, or practice contractions. Braxton Hicks contractions are usually shorter, weaker, farther apart, and less regular than true labor contractions. True labor contractions usually become progressively stronger and regular, and they become more frequent. Manage discomfort from [facility] contractions by changing position, resting in a warm bath, drinking plenty of water, or practicing deep breathing. This information is not intended to replace advice given to you by your health care provider. Make sure you discuss any questions you have with your health care provider. Document Revised: 06/22/2017 Document Reviewed: 11/23/2016 Elsevier Patient Education  2020 [employer].

## 2023-05-23 ENCOUNTER — Encounter: Payer: Self-pay | Admitting: Obstetrics & Gynecology

## 2023-05-23 ENCOUNTER — Other Ambulatory Visit: Payer: Medicaid Other

## 2023-05-23 ENCOUNTER — Ambulatory Visit (INDEPENDENT_AMBULATORY_CARE_PROVIDER_SITE_OTHER): Payer: Medicaid Other | Admitting: Obstetrics & Gynecology

## 2023-05-23 ENCOUNTER — Encounter (HOSPITAL_COMMUNITY): Payer: Self-pay | Admitting: Obstetrics & Gynecology

## 2023-05-23 ENCOUNTER — Inpatient Hospital Stay (HOSPITAL_COMMUNITY)
Admission: AD | Admit: 2023-05-23 | Discharge: 2023-05-25 | DRG: 807 | Disposition: A | Discharge status: Home / Self Care | Payer: Medicaid Other | Attending: Family Medicine | Admitting: Family Medicine

## 2023-05-23 VITALS — BP 128/83 | HR 106 | Wt 279.6 lb

## 2023-05-23 DIAGNOSIS — Z87891 Personal history of nicotine dependence: Secondary | ICD-10-CM

## 2023-05-23 DIAGNOSIS — F418 Other specified anxiety disorders: Secondary | ICD-10-CM | POA: Diagnosis present

## 2023-05-23 DIAGNOSIS — Z823 Family history of stroke: Secondary | ICD-10-CM

## 2023-05-23 DIAGNOSIS — O99344 Other mental disorders complicating childbirth: Secondary | ICD-10-CM | POA: Diagnosis present

## 2023-05-23 DIAGNOSIS — F1290 Cannabis use, unspecified, uncomplicated: Secondary | ICD-10-CM | POA: Diagnosis present

## 2023-05-23 DIAGNOSIS — Z3A38 38 weeks gestation of pregnancy: Secondary | ICD-10-CM

## 2023-05-23 DIAGNOSIS — Z8659 Personal history of other mental and behavioral disorders: Secondary | ICD-10-CM

## 2023-05-23 DIAGNOSIS — Z833 Family history of diabetes mellitus: Secondary | ICD-10-CM

## 2023-05-23 DIAGNOSIS — O99213 Obesity complicating pregnancy, third trimester: Secondary | ICD-10-CM | POA: Diagnosis not present

## 2023-05-23 DIAGNOSIS — Z83511 Family history of glaucoma: Secondary | ICD-10-CM

## 2023-05-23 DIAGNOSIS — O99214 Obesity complicating childbirth: Secondary | ICD-10-CM | POA: Diagnosis present

## 2023-05-23 DIAGNOSIS — O99210 Obesity complicating pregnancy, unspecified trimester: Secondary | ICD-10-CM

## 2023-05-23 DIAGNOSIS — Z8269 Family history of other diseases of the musculoskeletal system and connective tissue: Secondary | ICD-10-CM

## 2023-05-23 DIAGNOSIS — Z7982 Long term (current) use of aspirin: Secondary | ICD-10-CM

## 2023-05-23 DIAGNOSIS — Z6841 Body Mass Index (BMI) 40.0 and over, adult: Secondary | ICD-10-CM

## 2023-05-23 DIAGNOSIS — Z836 Family history of other diseases of the respiratory system: Secondary | ICD-10-CM

## 2023-05-23 DIAGNOSIS — Z3480 Encounter for supervision of other normal pregnancy, unspecified trimester: Principal | ICD-10-CM

## 2023-05-23 DIAGNOSIS — F419 Anxiety disorder, unspecified: Secondary | ICD-10-CM | POA: Diagnosis present

## 2023-05-23 DIAGNOSIS — Z3490 Encounter for supervision of normal pregnancy, unspecified, unspecified trimester: Secondary | ICD-10-CM

## 2023-05-23 DIAGNOSIS — F32A Depression, unspecified: Secondary | ICD-10-CM | POA: Diagnosis present

## 2023-05-23 DIAGNOSIS — Z8249 Family history of ischemic heart disease and other diseases of the circulatory system: Secondary | ICD-10-CM

## 2023-05-23 DIAGNOSIS — Z3483 Encounter for supervision of other normal pregnancy, third trimester: Secondary | ICD-10-CM

## 2023-05-23 DIAGNOSIS — Z811 Family history of alcohol abuse and dependence: Secondary | ICD-10-CM

## 2023-05-23 DIAGNOSIS — O358XX0 Maternal care for other (suspected) fetal abnormality and damage, not applicable or unspecified: Principal | ICD-10-CM | POA: Diagnosis present

## 2023-05-23 DIAGNOSIS — O9902 Anemia complicating childbirth: Secondary | ICD-10-CM | POA: Diagnosis present

## 2023-05-23 DIAGNOSIS — O99324 Drug use complicating childbirth: Secondary | ICD-10-CM | POA: Diagnosis present

## 2023-05-23 LAB — POCT FERN TEST
POCT Fern Test: NEGATIVE
POCT Fern Test: NEGATIVE

## 2023-05-23 NOTE — Progress Notes (Signed)
HIGH-RISK PREGNANCY VISIT Patient name: Leah Kennedy MRN [id number]  Date of birth: February 05, 1996 Chief Complaint:   Routine Prenatal Visit ([place] today)  History of Present Illness:   Leah Kennedy is a 27 y.o. [phone] female at [redacted]w[redacted]d with an Estimated Date of Delivery: 06/01/23 being seen today for ongoing management of a high-risk pregnancy complicated by:  -morbid obesity -dep/anxiety- no meds currently, plan for zoloft postpartum  Today she reports no complaints.   Contractions: Irritability. Vag. Bleeding: None.  Movement: Present. denies leaking of fluid.      11/23/2022   11:25 AM 03/24/2022   12:19 PM 12/22/2021   12:28 PM 06/20/2021   11:22 AM 03/01/2021    8:55 AM  Depression screen PHQ 2/9  Decreased Interest 3 2 2 1 2   Down, Depressed, Hopeless 3 2 2 1 2   PHQ - 2 Score 6 4 4 2 4   Altered sleeping 3 3 3 3 2   Tired, decreased energy 3 2 3  0 2  Change in appetite 3 2 3  0 2  Feeling bad or failure about yourself  3 2 2  0 2  Trouble concentrating 3 2 2  0 2  Moving slowly or fidgety/restless 3 2 2  0 0  Suicidal thoughts 0 2 1 0 0  PHQ-9 Score 24 19 20 5 14      Current Outpatient Medications  Medication Instructions   ascorbic acid (VITAMIN C) 500 mg, Oral, Daily   ASHWAGANDHA PO Oral   aspirin EC 162 mg, Oral, Daily   Biotin w/ Vitamins C & E (HAIR/SKIN/NAILS PO) Oral   calcium carbonate (TUMS EX) 750 MG chewable tablet 1 tablet, Oral, Daily   ferrous gluconate (FERGON) 324 mg, Oral, Daily with breakfast   Prenatal MV & Min w/FA-DHA (PRENATAL GUMMIES PO) 0.5 tablets, Oral, 2 times daily   sertraline (ZOLOFT) 25 mg, Oral, Daily   thiamine (VITAMIN B-1) 100 mg, Oral, Daily   traZODone (DESYREL) 50 mg, Oral, Daily at bedtime     Review of Systems:   Pertinent items are noted in HPI Denies abnormal vaginal discharge w/ itching/odor/irritation, headaches, visual changes, shortness of breath, chest pain, abdominal pain, severe nausea/vomiting, or problems with urination or  bowel movements unless otherwise stated above. Pertinent History Reviewed:  Reviewed past medical,surgical, social, obstetrical and family history.  Reviewed problem list, medications and allergies. Physical Assessment:   Vitals:   05/23/23 0938  BP: 128/83  Pulse: (!) 106  Weight: 279 lb 9.6 oz (126.8 kg)  Body mass index is 45.13 kg/m.           Physical Examination:   General appearance: alert, well appearing, and in no distress  Mental status: normal mood, behavior, speech, dress, motor activity, and thought processes  Skin: warm & dry   Extremities: Edema: None    Cardiovascular: normal heart rate noted  Respiratory: normal respiratory effort, no distress  Abdomen: gravid, soft, non-tender  Pelvic: Cervical exam performed  Dilation: 2.5 Effacement (%): 50 Station: -2  Fetal Status:     Movement: Present Presentation: Vertex  Fetal Surveillance Testing today: ,cephalic,BPP 8/8,FHR 137 bpm,posterior placenta gr 3,AFI 16.7 cm,mild right renal pelvic dilatation, RK 8.8 mm,LK 5.4 mm WNL    Chaperone:  pt declined     No results found for this or any previous visit (from the past 24 hour(s)).   Assessment & Plan:  High-risk pregnancy: [id number] at [redacted]w[redacted]d with an Estimated Date of Delivery: 06/01/23   1) obesity -BPP 8/8  IOL scheduled  for 11/8, hopeful for labor prior to this date  Meds: No orders of the defined types were placed in this encounter.   Labs/procedures today: BPP  Treatment Plan:  routine OB care  Reviewed: Term labor symptoms and general obstetric precautions including but not limited to vaginal bleeding, contractions, leaking of fluid and fetal movement were reviewed in detail with the patient.  All questions were answered. Pt has home bp cuff. Check bp weekly, let us know if >140/90.   Follow-up: Return in about 1 week (around 05/30/2023) for LROB visit (as scheduled with BPP).   Future Appointments  Date Time Provider Department Center  05/23/2023  10:30 AM Myna Hidalgo, DO [facility] FTOBGYN  05/30/2023  9:15 AM [facility] - FTOBGYN [place] [facility] None  05/30/2023 10:10 AM [name], CNM [facility] FTOBGYN  06/01/2023  6:30 AM [facility]-LD SCHED ROOM [facility]-INDC None    No orders of the defined types were placed in this encounter.   Myna Hidalgo, DO Attending Obstetrician & Gynecologist, [facility] for [employer], [facility]

## 2023-05-23 NOTE — MAU Note (Signed)
.  Leah Kennedy is a 27 y.o. at [redacted]w[redacted]d here in MAU reporting:   Contractions every: 7 minutes Onset of ctx: Today 6pm  Pain score:  5/10  ROM: mucous plug came out around 1900and continues to leak now  Vaginal Bleeding: bloody show Last SVE: 3 cm Labor Pain Management Plan: Planning Waterbirth  Fetal Movement: Reports positive FM FHT:  applied in room   Vitals:   05/23/23 2214  BP: 118/73  Pulse: (!) 106  Resp: 17  Temp: 99.1 F (37.3 C)  SpO2: 100%     OB Office: Faculty GBS: Negative Lab orders placed from triage: MAU Labor Eval

## 2023-05-23 NOTE — Progress Notes (Signed)
[place] 38+5 wks,cephalic,BPP 8/8,FHR 137 bpm,posterior placenta gr 3,AFI 16.7 cm,mild right renal pelvic dilatation, RK 8.8 mm,LK 5.4 mm WNL

## 2023-05-24 ENCOUNTER — Inpatient Hospital Stay (HOSPITAL_COMMUNITY): Payer: Medicaid Other | Admitting: Anesthesiology

## 2023-05-24 ENCOUNTER — Encounter (HOSPITAL_COMMUNITY): Payer: Self-pay | Admitting: Obstetrics & Gynecology

## 2023-05-24 DIAGNOSIS — O99214 Obesity complicating childbirth: Secondary | ICD-10-CM | POA: Diagnosis not present

## 2023-05-24 DIAGNOSIS — Z7982 Long term (current) use of aspirin: Secondary | ICD-10-CM | POA: Diagnosis not present

## 2023-05-24 DIAGNOSIS — Z836 Family history of other diseases of the respiratory system: Secondary | ICD-10-CM | POA: Diagnosis not present

## 2023-05-24 DIAGNOSIS — Z3A38 38 weeks gestation of pregnancy: Secondary | ICD-10-CM | POA: Diagnosis not present

## 2023-05-24 DIAGNOSIS — Z833 Family history of diabetes mellitus: Secondary | ICD-10-CM | POA: Diagnosis not present

## 2023-05-24 DIAGNOSIS — O99344 Other mental disorders complicating childbirth: Secondary | ICD-10-CM | POA: Diagnosis present

## 2023-05-24 DIAGNOSIS — Z83511 Family history of glaucoma: Secondary | ICD-10-CM | POA: Diagnosis not present

## 2023-05-24 DIAGNOSIS — O9902 Anemia complicating childbirth: Secondary | ICD-10-CM | POA: Diagnosis present

## 2023-05-24 DIAGNOSIS — O358XX0 Maternal care for other (suspected) fetal abnormality and damage, not applicable or unspecified: Secondary | ICD-10-CM | POA: Diagnosis present

## 2023-05-24 DIAGNOSIS — Z8249 Family history of ischemic heart disease and other diseases of the circulatory system: Secondary | ICD-10-CM | POA: Diagnosis not present

## 2023-05-24 DIAGNOSIS — Z823 Family history of stroke: Secondary | ICD-10-CM | POA: Diagnosis not present

## 2023-05-24 DIAGNOSIS — F1290 Cannabis use, unspecified, uncomplicated: Secondary | ICD-10-CM | POA: Diagnosis present

## 2023-05-24 DIAGNOSIS — Z8269 Family history of other diseases of the musculoskeletal system and connective tissue: Secondary | ICD-10-CM | POA: Diagnosis not present

## 2023-05-24 DIAGNOSIS — F419 Anxiety disorder, unspecified: Secondary | ICD-10-CM | POA: Diagnosis present

## 2023-05-24 DIAGNOSIS — Z811 Family history of alcohol abuse and dependence: Secondary | ICD-10-CM | POA: Diagnosis not present

## 2023-05-24 DIAGNOSIS — F32A Depression, unspecified: Secondary | ICD-10-CM | POA: Diagnosis present

## 2023-05-24 DIAGNOSIS — Z87891 Personal history of nicotine dependence: Secondary | ICD-10-CM | POA: Diagnosis not present

## 2023-05-24 DIAGNOSIS — O26893 Other specified pregnancy related conditions, third trimester: Secondary | ICD-10-CM | POA: Diagnosis present

## 2023-05-24 LAB — CBC
HCT: 33 % — ABNORMAL LOW (ref 36.0–46.0)
Hemoglobin: 10.3 g/dL — ABNORMAL LOW (ref 12.0–15.0)
MCH: 24.7 pg — ABNORMAL LOW (ref 26.0–34.0)
MCHC: 31.2 g/dL (ref 30.0–36.0)
MCV: 79.1 fL — ABNORMAL LOW (ref 80.0–100.0)
Platelets: 269 10*3/uL (ref 150–400)
RBC: 4.17 MIL/uL (ref 3.87–5.11)
RDW: 14.4 % (ref 11.5–15.5)
WBC: 15.6 10*3/uL — ABNORMAL HIGH (ref 4.0–10.5)
nRBC: 0 % (ref 0.0–0.2)

## 2023-05-24 LAB — TYPE AND SCREEN
ABO/RH(D): AB POS
Antibody Screen: NEGATIVE

## 2023-05-24 LAB — RPR: RPR Ser Ql: NONREACTIVE

## 2023-05-24 MED ORDER — WITCH HAZEL-GLYCERIN EX PADS
1.0000 | MEDICATED_PAD | CUTANEOUS | Status: DC | PRN
Start: 2023-05-24 — End: 2023-05-25

## 2023-05-24 MED ORDER — EPHEDRINE 5 MG/ML INJ: 10.0000 mg | INTRAVENOUS | Status: DC | PRN

## 2023-05-24 MED ORDER — SENNOSIDES-DOCUSATE SODIUM 8.6-50 MG PO TABS
2.0000 | ORAL_TABLET | Freq: Every day | ORAL | Status: DC
  Administered 2023-05-25: 2 via ORAL
  Filled 2023-05-24: qty 2

## 2023-05-24 MED ORDER — LACTATED RINGERS IV SOLN
500.0000 mL | INTRAVENOUS | Status: DC | PRN
  Administered 2023-05-24: 1000 mL via INTRAVENOUS
  Administered 2023-05-24: 500 mL via INTRAVENOUS

## 2023-05-24 MED ORDER — DIPHENHYDRAMINE HCL 50 MG/ML IJ SOLN: 12.5000 mg | INTRAMUSCULAR | Status: DC | PRN

## 2023-05-24 MED ORDER — PRENATAL MULTIVITAMIN CH: 1.0000 | ORAL_TABLET | Freq: Every day | ORAL | Status: DC

## 2023-05-24 MED ORDER — METHYLERGONOVINE MALEATE 0.2 MG/ML IJ SOLN: 0.2000 mg | Freq: Once | INTRAMUSCULAR | Status: AC | PRN

## 2023-05-24 MED ORDER — TETANUS-DIPHTH-ACELL PERTUSSIS 5-2.5-18.5 LF-MCG/0.5 IM SUSY
0.5000 mL | PREFILLED_SYRINGE | Freq: Once | INTRAMUSCULAR | Status: DC
Start: 2023-05-25 — End: 2023-05-25

## 2023-05-24 MED ORDER — OXYCODONE-ACETAMINOPHEN 5-325 MG PO TABS: 1.0000 | ORAL_TABLET | ORAL | Status: DC | PRN

## 2023-05-24 MED ORDER — ZOLPIDEM TARTRATE 5 MG PO TABS: 5.0000 mg | ORAL_TABLET | Freq: Every evening | ORAL | Status: DC | PRN

## 2023-05-24 MED ORDER — COMPLETENATE 29-1 MG PO CHEW
1.0000 | CHEWABLE_TABLET | Freq: Every day | ORAL | Status: DC
  Administered 2023-05-24 – 2023-05-25 (×2): 1 via ORAL
  Filled 2023-05-24 (×2): qty 1

## 2023-05-24 MED ORDER — TRANEXAMIC ACID-NACL 1000-0.7 MG/100ML-% IV SOLN: 1000.0000 mg | Freq: Once | INTRAVENOUS | Status: AC | PRN

## 2023-05-24 MED ORDER — PHENYLEPHRINE 80 MCG/ML (10ML) SYRINGE FOR IV PUSH (FOR BLOOD PRESSURE SUPPORT)
80.0000 ug | PREFILLED_SYRINGE | INTRAVENOUS | Status: DC | PRN
  Filled 2023-05-24: qty 10

## 2023-05-24 MED ORDER — ONDANSETRON HCL 4 MG/2ML IJ SOLN: 4.0000 mg | INTRAMUSCULAR | Status: DC | PRN

## 2023-05-24 MED ORDER — OXYCODONE-ACETAMINOPHEN 5-325 MG PO TABS: 2.0000 | ORAL_TABLET | ORAL | Status: DC | PRN

## 2023-05-24 MED ORDER — TRANEXAMIC ACID-NACL 1000-0.7 MG/100ML-% IV SOLN
INTRAVENOUS | Status: AC
  Administered 2023-05-24: 1000 mg via INTRAVENOUS
  Filled 2023-05-24: qty 100

## 2023-05-24 MED ORDER — FENTANYL CITRATE (PF) 100 MCG/2ML IJ SOLN
50.0000 ug | INTRAMUSCULAR | Status: DC | PRN
Start: 2023-05-24 — End: 2023-05-24

## 2023-05-24 MED ORDER — LIDOCAINE-EPINEPHRINE (PF) 2 %-1:200000 IJ SOLN
INTRAMUSCULAR | Status: DC | PRN
  Administered 2023-05-24: 5 mL via EPIDURAL

## 2023-05-24 MED ORDER — LACTATED RINGERS IV SOLN: INTRAVENOUS | Status: DC

## 2023-05-24 MED ORDER — OXYTOCIN-SODIUM CHLORIDE 30-0.9 UT/500ML-% IV SOLN
2.5000 [IU]/h | INTRAVENOUS | Status: DC
  Filled 2023-05-24: qty 500

## 2023-05-24 MED ORDER — COCONUT OIL OIL: 1.0000 | TOPICAL_OIL | Status: DC | PRN

## 2023-05-24 MED ORDER — FENTANYL-BUPIVACAINE-NACL 0.5-0.125-0.9 MG/250ML-% EP SOLN
12.0000 mL/h | EPIDURAL | Status: DC | PRN
  Administered 2023-05-24: 12 mL/h via EPIDURAL
  Filled 2023-05-24: qty 250

## 2023-05-24 MED ORDER — OXYTOCIN BOLUS FROM INFUSION
333.0000 mL | Freq: Once | INTRAVENOUS | Status: AC
  Administered 2023-05-24: 333 mL via INTRAVENOUS

## 2023-05-24 MED ORDER — METHYLERGONOVINE MALEATE 0.2 MG/ML IJ SOLN
INTRAMUSCULAR | Status: AC
  Administered 2023-05-24: 0.2 mg via INTRAMUSCULAR
  Filled 2023-05-24: qty 1

## 2023-05-24 MED ORDER — SOD CITRATE-CITRIC ACID 500-334 MG/5ML PO SOLN: 30.0000 mL | ORAL | Status: DC | PRN

## 2023-05-24 MED ORDER — BENZOCAINE-MENTHOL 20-0.5 % EX AERO: 1.0000 | INHALATION_SPRAY | CUTANEOUS | Status: DC | PRN

## 2023-05-24 MED ORDER — MISOPROSTOL 200 MCG PO TABS
ORAL_TABLET | ORAL | Status: AC
  Filled 2023-05-24: qty 4

## 2023-05-24 MED ORDER — ONDANSETRON HCL 4 MG PO TABS: 4.0000 mg | ORAL_TABLET | ORAL | Status: DC | PRN

## 2023-05-24 MED ORDER — ONDANSETRON HCL 4 MG/2ML IJ SOLN
4.0000 mg | Freq: Four times a day (QID) | INTRAMUSCULAR | Status: DC | PRN
  Administered 2023-05-24: 4 mg via INTRAVENOUS
  Filled 2023-05-24: qty 2

## 2023-05-24 MED ORDER — IBUPROFEN 600 MG PO TABS
600.0000 mg | ORAL_TABLET | Freq: Four times a day (QID) | ORAL | Status: DC
  Administered 2023-05-24 – 2023-05-25 (×4): 600 mg via ORAL
  Filled 2023-05-24 (×4): qty 1

## 2023-05-24 MED ORDER — MISOPROSTOL 200 MCG PO TABS
800.0000 ug | ORAL_TABLET | Freq: Once | ORAL | Status: AC | PRN
  Administered 2023-05-24: 800 ug via BUCCAL

## 2023-05-24 MED ORDER — LIDOCAINE HCL (PF) 1 % IJ SOLN: 30.0000 mL | INTRAMUSCULAR | Status: DC | PRN

## 2023-05-24 MED ORDER — SIMETHICONE 80 MG PO CHEW: 80.0000 mg | CHEWABLE_TABLET | ORAL | Status: DC | PRN

## 2023-05-24 MED ORDER — ACETAMINOPHEN 325 MG PO TABS: 650.0000 mg | ORAL_TABLET | ORAL | Status: DC | PRN

## 2023-05-24 MED ORDER — LACTATED RINGERS IV SOLN
500.0000 mL | Freq: Once | INTRAVENOUS | Status: AC
  Administered 2023-05-24: 500 mL via INTRAVENOUS

## 2023-05-24 MED ORDER — DIBUCAINE (PERIANAL) 1 % EX OINT: 1.0000 | TOPICAL_OINTMENT | CUTANEOUS | Status: DC | PRN

## 2023-05-24 MED ORDER — DIPHENHYDRAMINE HCL 25 MG PO CAPS: 25.0000 mg | ORAL_CAPSULE | Freq: Four times a day (QID) | ORAL | Status: DC | PRN

## 2023-05-24 MED ORDER — PHENYLEPHRINE 80 MCG/ML (10ML) SYRINGE FOR IV PUSH (FOR BLOOD PRESSURE SUPPORT)
80.0000 ug | PREFILLED_SYRINGE | INTRAVENOUS | Status: DC | PRN
  Administered 2023-05-24 (×2): 80 ug via INTRAVENOUS

## 2023-05-24 NOTE — Lactation Note (Signed)
This note was copied from a baby's chart. Lactation Consultation Note  Patient Name: Girl Arletha Trevithick [id number] Date: 05/24/2023 Age:25 hours  Reason for consult: Initial assessment;Early term 37-38.6wks  P4, [redacted]w[redacted]d  RN called for [facility] to see mother because baby was wanting to breastfeed and fussy when removed from breast. Upon initial LC visit to the room, baby was sleeping skin to skin on mother's chest. Mother was alert and resting. She reports baby just settled. She had questions about baby getting enough. Mother reports baby had milk around her mouth and she heard a few swallows.  Mother encouraged to latch baby with feeding cues, place baby skin to skin if not latching, and call for assistance with breastfeeding as needed.   Mom made aware of O/P services, breastfeeding support groups, community resources, and our phone # for post-discharge questions.    Maternal Data Does the patient have breastfeeding experience prior to this delivery?: Yes How long did the patient breastfeed?: 8 months  Feeding Mother's Current Feeding Choice: Breast Milk    Interventions Interventions: Education;LC Services brochure   Consult Status Consult Status: Follow-up Date: 05/25/23 Follow-up type: In-patient    Christella Hartigan M 05/24/2023, 6:18 PM

## 2023-05-24 NOTE — MAU Provider Note (Signed)
  S: [name] is a 27 y.o. [phone] at [redacted]w[redacted]d  who presents to MAU today complaining contractions q 2-6 minutes since 1900. She denies vaginal bleeding. Some bloody show. She endorses LOF. She reports normal fetal movement.  Desires water birth.  O: BP 122/78   Pulse (!) 114   Temp 99.1 F (37.3 C) (Oral)   Resp 17   Ht 5\' 7"  (1.702 m)   LMP 09/01/2022   SpO2 99%   BMI 43.79 kg/m  GENERAL: Well-developed, well-nourished female in no acute distress.  HEAD: Normocephalic, atraumatic.  CHEST: Normal effort of breathing, regular heart rate ABDOMEN: Soft, nontender, gravid  Speculum: Negative pooling and ferning.   Cervical exam:  Dilation: 5 Effacement (%): 50 Station: -3 Presentation: Vertex Exam by:: Leanora Cover, MD   Fetal Monitoring: Baseline: 130 Variability: moderate Accelerations: present Decelerations: absent Contractions: q2-91min   A: SIUP at [redacted]w[redacted]d  Active labor  P: Woodroe Mode, MD 05/24/2023 12:23 AM

## 2023-05-24 NOTE — Discharge Summary (Signed)
Postpartum Discharge Summary      Patient Name: Leah Kennedy DOB: [birth year] MRN: [id number]  Date of admission: 05/23/2023 Delivery date:05/24/2023 Delivering provider: Joanne Gavel Date of discharge: 05/25/2023  Admitting diagnosis: Normal labor [O80, Z37.9] Intrauterine pregnancy: [redacted]w[redacted]d     Secondary diagnosis:  Active Problems:   Morbid obesity with BMI of 40.0-44.9, adult (HCC)   Depression with anxiety   History of postpartum depression   Encounter for supervision of normal pregnancy, antepartum   Normal labor  Additional problems: none    Discharge diagnosis: Term Pregnancy Delivered                                              Post partum procedures: mpme Augmentation: AROM Complications: None  Hospital course: Onset of Labor With Vaginal Delivery      27 y.o. yo [id number] at [redacted]w[redacted]d was admitted in Active Labor on 05/23/2023. Labor course was complicated by none.  Membrane Rupture Time/Date: 6:06 AM,05/24/2023  Delivery Method:Vaginal, Spontaneous Operative Delivery:N/A Episiotomy: None Lacerations:  None Patient had a postpartum course complicated by nothing.  She is ambulating, tolerating a regular diet, passing flatus, and urinating well. She elects to re-start zoloft and is advised to have a low threshold to reach out to her ob provider for mood support (history PP depression though denies current depressive symptoms). Patient is discharged home in stable condition on 05/25/23.  Newborn Data: Birth date:05/24/2023 Birth time:10:39 AM Gender:Female Living status:Living Apgars:9 ,9  Weight:3470 g  Magnesium Sulfate received: No BMZ received: No Rhophylac:No MMR:No T-DaP:Given prenatally Transfusion:No  Immunizations received: Immunization History  Administered Date(s) Administered   Influenza,inj,Quad PF,6+ Mos 07/01/2018, 06/07/2020, 06/20/2021   MMR 10/19/2020   PFIZER Comirnaty(Gray Top)Covid-19 Tri-Sucrose Vaccine 10/20/2020, 09/04/2021    Tdap 10/25/2018, 08/06/2020, 06/20/2021, 03/21/2023    Physical exam  Vitals:   05/24/23 1340 05/24/23 1749 05/24/23 2200 05/25/23 0521  BP: 124/70 115/66 119/76 (!) 118/46  Pulse: (!) 57 85 85 67  Resp: 18 18 18 18   Temp: 99.5 F (37.5 C) 98 F (36.7 C)  98.2 F (36.8 C)  TempSrc: Oral Oral  Oral  SpO2: 100% 99% 98% 98%  Weight:      Height:       General: alert, cooperative, and no distress Lochia: appropriate Uterine Fundus: firm Incision: N/A DVT Evaluation: No evidence of DVT seen on physical exam. No cords or calf tenderness. Labs: Lab Results  Component Value Date   WBC 12.5 (H) 05/25/2023   HGB 9.0 (L) 05/25/2023   HCT 29.5 (L) 05/25/2023   MCV 80.2 05/25/2023   PLT 239 05/25/2023      Latest Ref Rng & Units 07/09/2021    4:42 AM  CMP  Glucose 70 - 99 mg/dL 86   BUN 6 - 20 mg/dL <5   Creatinine 4.01 - 1.00 mg/dL 0.27   Sodium 253 - 664 mmol/L 137   Potassium 3.5 - 5.1 mmol/L 3.9   Chloride 98 - 111 mmol/L 111   CO2 22 - 32 mmol/L 22   Calcium 8.9 - 10.3 mg/dL 7.9    Edinburgh Score:    10/05/2021   11:43 AM  Edinburgh Postnatal Depression Scale Screening Tool  I have been able to laugh and see the funny side of things. 1  I have looked forward with enjoyment to things. 1  I  have blamed myself unnecessarily when things went wrong. 2  I have been anxious or worried for no good reason. 2  I have felt scared or panicky for no good reason. 2  Things have been getting on top of me. 2  I have been so unhappy that I have had difficulty sleeping. 2  I have felt sad or miserable. 2  I have been so unhappy that I have been crying. 2  The thought of harming myself has occurred to me. 0  Edinburgh Postnatal Depression Scale Total 16   No data recorded  After visit meds:  Allergies as of 05/25/2023       Reactions   Carrot [daucus Carota] Itching   Kiwi Extract Itching        Medication List     STOP taking these medications    ascorbic acid  500 MG tablet Commonly known as: VITAMIN C   ASHWAGANDHA PO   aspirin EC 81 MG tablet   calcium carbonate 750 MG chewable tablet Commonly known as: TUMS EX   ferrous gluconate 324 MG tablet Commonly known as: FERGON   HAIR/SKIN/NAILS PO   thiamine 100 MG tablet Commonly known as: Vitamin B-1   traZODone 50 MG tablet Commonly known as: DESYREL       TAKE these medications    acetaminophen 325 MG tablet Commonly known as: Tylenol Take 2 tablets (650 mg total) by mouth every 4 (four) hours as needed (for pain scale < 4).   ibuprofen 600 MG tablet Commonly known as: ADVIL Take 1 tablet (600 mg total) by mouth every 6 (six) hours.   PRENATAL GUMMIES PO Take 0.5 tablets by mouth 2 (two) times daily.   sertraline 50 MG tablet Commonly known as: Zoloft Take 1 tablet (50 mg total) by mouth daily. What changed:  medication strength how much to take         Discharge home in stable condition Infant Feeding: Bottle and Breast Infant Disposition:home with mother Discharge instruction: per After Visit Summary and Postpartum booklet. Activity: Advance as tolerated. Pelvic rest for 6 weeks.  Diet: routine diet Future Appointments: Future Appointments  Date Time Provider Department Center  06/07/2023  2:30 PM Jacklyn Shell, CNM [facility]  07/05/2023 10:30 AM Grace Bushy Merlene Laughter, CNM [facility]   Follow up Visit:  Follow-up Information     [facility] for [facility] Follow up.   Specialty: Obstetrics and Gynecology Contact information: [address] [place] [address] [phone]                Message sent to [facility] 10/31  Please schedule this patient for a In person postpartum visit in 6 weeks with the following provider: Any provider. Additional Postpartum F/U: Postpartum Depression checkup  Low risk pregnancy complicated by:  none Delivery mode:  Vaginal,  Spontaneous Anticipated Birth Control:  IUD at postpartum visit   05/25/2023 Silvano Bilis, MD

## 2023-05-24 NOTE — Progress Notes (Signed)
Called to room for bleeding  EBL is still low (<500) I performed a fundal rub and there was trickling.  S/p pitocin, methergine and had TXA hanging now.  Vaginal vault examined with sterile gloves and no clots in LUS noted. Uterus was quite firm from internal exam with external fundal hand.  No further interventions now Discussed with RN that is bleeding continues, will consider cytotec.   Federico Flake, MD

## 2023-05-24 NOTE — Anesthesia Procedure Notes (Signed)
Epidural Patient location during procedure: OB Start time: 05/24/2023 2:05 AM End time: 05/24/2023 2:15 AM  Staffing Anesthesiologist: Elmer Picker, MD Performed: anesthesiologist   Preanesthetic Checklist Completed: patient identified, IV checked, risks and benefits discussed, monitors and equipment checked, pre-op evaluation and timeout performed  Epidural Patient position: sitting Prep: DuraPrep and site prepped and draped Patient monitoring: continuous pulse ox, blood pressure, heart rate and cardiac monitor Approach: midline Location: L3-L4 Injection technique: LOR air  Needle:  Needle type: Tuohy  Needle gauge: 17 G Needle length: 9 cm Needle insertion depth: 8 cm Catheter type: closed end flexible Catheter size: 19 Gauge Catheter at skin depth: 14 cm Test dose: negative  Assessment Sensory level: T8 Events: blood not aspirated, no cerebrospinal fluid, injection not painful, no injection resistance, no paresthesia and negative IV test  Additional Notes Patient identified. Risks/Benefits/Options discussed with patient including but not limited to bleeding, infection, nerve damage, paralysis, failed block, incomplete pain control, headache, blood pressure changes, nausea, vomiting, reactions to medication both or allergic, itching and postpartum back pain. Confirmed with bedside nurse the patient's most recent platelet count. Confirmed with patient that they are not currently taking any anticoagulation, have any bleeding history or any family history of bleeding disorders. Patient expressed understanding and wished to proceed. All questions were answered. Sterile technique was used throughout the entire procedure. Please see nursing notes for vital signs. Test dose was given through epidural catheter and negative prior to continuing to dose epidural or start infusion. Warning signs of high block given to the patient including shortness of breath, tingling/numbness in  hands, complete motor block, or any concerning symptoms with instructions to call for help. Patient was given instructions on fall risk and not to get out of bed. All questions and concerns addressed with instructions to call with any issues or inadequate analgesia.  Reason for block:procedure for pain

## 2023-05-24 NOTE — Anesthesia Preprocedure Evaluation (Signed)
Anesthesia Evaluation  Patient identified by MRN, date of birth, ID band Patient awake    Reviewed: Allergy & Precautions, NPO status , Patient's Chart, lab work & pertinent test results  Airway Mallampati: III  TM Distance: >3 FB Neck ROM: Full    Dental no notable dental hx.    Pulmonary neg pulmonary ROS, former smoker   Pulmonary exam normal breath sounds clear to auscultation       Cardiovascular negative cardio ROS Normal cardiovascular exam Rhythm:Regular Rate:Normal     Neuro/Psych  PSYCHIATRIC DISORDERS Anxiety Depression    negative neurological ROS     GI/Hepatic negative GI ROS,,,(+)     substance abuse  marijuana use  Endo/Other    Morbid obesity (BMI 44)  Renal/GU negative Renal ROS  negative genitourinary   Musculoskeletal negative musculoskeletal ROS (+)    Abdominal   Peds  Hematology  (+) Blood dyscrasia, anemia   Anesthesia Other Findings Presents in labor  Reproductive/Obstetrics (+) Pregnancy                             Anesthesia Physical Anesthesia Plan  ASA: 3  Anesthesia Plan: Epidural   Post-op Pain Management:    Induction:   PONV Risk Score and Plan: Treatment may vary due to age or medical condition  Airway Management Planned: Natural Airway  Additional Equipment:   Intra-op Plan:   Post-operative Plan:   Informed Consent: I have reviewed the patients History and Physical, chart, labs and discussed the procedure including the risks, benefits and alternatives for the proposed anesthesia with the patient or authorized representative who has indicated his/her understanding and acceptance.       Plan Discussed with: Anesthesiologist  Anesthesia Plan Comments: (Patient identified. Risks, benefits, options discussed with patient including but not limited to bleeding, infection, nerve damage, paralysis, failed block, incomplete pain control,  headache, blood pressure changes, nausea, vomiting, reactions to medication, itching, and post partum back pain. Confirmed with bedside nurse the patient's most recent platelet count. Confirmed with the patient that they are not taking any anticoagulation, have any bleeding history or any family history of bleeding disorders. Patient expressed understanding and wishes to proceed. All questions were answered. )       Anesthesia Quick Evaluation

## 2023-05-24 NOTE — Anesthesia Postprocedure Evaluation (Signed)
Anesthesia Post Note  Patient: Leah Kennedy  Procedure(s) Performed: AN AD HOC LABOR EPIDURAL     Patient location during evaluation: Mother Baby Anesthesia Type: Epidural Level of consciousness: awake and alert and oriented Pain management: satisfactory to patient Vital Signs Assessment: post-procedure vital signs reviewed and stable Respiratory status: respiratory function stable Cardiovascular status: stable Postop Assessment: no headache, no backache, epidural receding, patient able to bend at knees, no signs of nausea or vomiting, adequate PO intake and able to ambulate Anesthetic complications: no   No notable events documented.  Last Vitals:  Vitals:   05/24/23 1252 05/24/23 1340  BP: 131/73 124/70  Pulse: (!) 53 (!) 57  Resp: 17 18  Temp: 37.3 C 37.5 C  SpO2: 100% 100%    Last Pain:  Vitals:   05/24/23 1340  TempSrc: Oral  PainSc:    Pain Goal:                   Leah Kennedy

## 2023-05-24 NOTE — H&P (Addendum)
Leah Kennedy is a 27 y.o. [phone] female at [redacted]w[redacted]d by u/s at [redacted]w[redacted]d presenting with reg ctx.   Reports active fetal movement, contractions: irregular, every 2-6 minutes, vaginal bleeding: spotting, membranes: intact.  Initiated prenatal care at [facility] at 12.6 wks.   Most recent u/s : [redacted]w[redacted]d, EFW 41%, cephalic, BPP 8/8, post placenta, AFI 14cm.   This pregnancy complicated by: # BMI >40 # dep/anx (no meds in preg; might want to start Zoloft PP) # fetal R mild RPD 8.59mm (f/u by peds) # wants waterbirth (class certificate under media)  Prenatal History/Complications:  # term vag delivery x 3 (2020, 2022, 2023)  Past Medical History: Past Medical History:  Diagnosis Date   Chlamydia 07/15/2018   tx 07/15/18 POC -/-      +11/19/19  tx 11/24/19, POC 04/21/20: +, tx 04/27/20  POC + 06/07/20, POC neg 09/10/20     +03/29/21  POC 06/20/21  -/-   Depression    Depression with anxiety 03/01/2021   No meds, declines [facility] referral   Marijuana use 04/27/2020   Counseled via MyChart 04/27/2020    Counseled via MyChart 03/07/2021     Repeat___    Morbid obesity (HCC)    Prediabetes    Previous known suicide attempt as a teenage 07/08/2021   Vaginal Pap smear, abnormal     Past Surgical History: Past Surgical History:  Procedure Laterality Date   ADENOIDECTOMY     TONSILLECTOMY  2017   TOOTH EXTRACTION Right 01/2021   WISDOM TOOTH EXTRACTION Bilateral 2020    Obstetrical History: OB History     Gravida  6   Para  3   Term  3   Preterm      AB  2   Living  3      SAB  2   IAB      Ectopic      Multiple  0   Live Births  3           Social History: Social History   Socioeconomic History   Marital status: Single    Spouse name: Latanya Maudlin   Number of children: 2   Years of education: 12   Highest education level: GED or equivalent  Occupational History   Not on file  Tobacco Use   Smoking status: Former   Smokeless tobacco: Never  Vaping Use   Vaping  status: Former  Substance and Sexual Activity   Alcohol use: Not Currently   Drug use: Not Currently    Types: Marijuana    Comment: before pregnancy   Sexual activity: Yes    Birth control/protection: None  Other Topics Concern   Not on file  Social History Narrative   Not on file   Social Determinants of Health   Financial Resource Strain: Low Risk  (11/23/2022)   Overall Financial Resource Strain (CARDIA)    Difficulty of Paying Living Expenses: Not hard at all  Food Insecurity: No Food Insecurity (11/23/2022)   Hunger Vital Sign    Worried About Running Out of Food in the Last Year: Never true    Ran Out of Food in the Last Year: Never true  Transportation Needs: No Transportation Needs (11/23/2022)   PRAPARE - Administrator, Civil Service (Medical): No    Lack of Transportation (Non-Medical): No  Physical Activity: Sufficiently Active (11/23/2022)   Exercise Vital Sign    Days of Exercise per Week: 4 days    Minutes of  Exercise per Session: 60 min  Stress: Stress Concern Present (11/23/2022)   [employer] of Occupational Health - Occupational Stress Questionnaire    Feeling of Stress : Rather much  Social Connections: Moderately Isolated (11/23/2022)   Social Connection and Isolation Panel [NHANES]    Frequency of Communication with Friends and Family: Three times a week    Frequency of Social Gatherings with Friends and Family: Once a week    Attends Religious Services: Never    Database administrator or Organizations: No    Attends Engineer, structural: Never    Marital Status: Living with partner    Family History: Family History  Problem Relation Age of Onset   Stroke Father    Alcohol abuse Father    Psychosis Father    Pulmonary disease Maternal Grandmother    Drug abuse Maternal Grandmother    Alcohol abuse Maternal Grandfather    Diabetes Paternal Grandmother    Heart murmur Paternal Grandmother    Hypertension Paternal Grandmother     Irritable bowel syndrome Paternal Grandmother    Glaucoma Paternal Grandmother    Diverticulitis Paternal Grandmother    Hypertension Paternal Grandfather    Diabetes Paternal Grandfather    Gout Paternal Grandfather    Kidney disease Paternal Grandfather    Glaucoma Paternal Grandfather     Allergies: Allergies  Allergen Reactions   Carrot [Daucus Carota] Itching   Kiwi Extract Itching    Medications Prior to Admission  Medication Sig Dispense Refill Last Dose   ASHWAGANDHA PO Take by mouth.   05/23/2023   aspirin EC 81 MG tablet Take 2 tablets (162 mg total) by mouth daily. 60 tablet 6 05/23/2023   Biotin w/ Vitamins C & E (HAIR/SKIN/NAILS PO) Take by mouth.   05/23/2023   calcium carbonate (TUMS EX) 750 MG chewable tablet Chew 1 tablet by mouth daily.   05/23/2023   ferrous gluconate (FERGON) 324 MG tablet Take 1 tablet (324 mg total) by mouth daily with breakfast. 30 tablet 3 05/23/2023   Prenatal MV & Min w/FA-DHA (PRENATAL GUMMIES PO) Take 0.5 tablets by mouth 2 (two) times daily.   05/23/2023   thiamine (VITAMIN B-1) 100 MG tablet Take 100 mg by mouth daily.   05/23/2023   vitamin C (ASCORBIC ACID) 500 MG tablet Take 500 mg by mouth daily.   05/23/2023   sertraline (ZOLOFT) 25 MG tablet Take 1 tablet (25 mg total) by mouth daily. (Patient not taking: Reported on 04/04/2023) 30 tablet 11    traZODone (DESYREL) 50 MG tablet Take 1 tablet (50 mg total) by mouth at bedtime. (Patient not taking: Reported on 10/02/2022) 30 tablet 3     Review of Systems  Pertinent pos/neg as indicated in HPI  Blood pressure 122/78, pulse (!) 114, temperature 99.1 F (37.3 C), temperature source Oral, resp. rate 17, height 5\' 7"  (1.702 m), last menstrual period 09/01/2022, SpO2 99%, not currently breastfeeding. General appearance: cooperative and mild distress Lungs: clear to auscultation bilaterally Heart: regular rate and rhythm Abdomen: gravid, soft, non-tender, EFW by Leopold's approximately  7lbs Extremities: 1+ edema  Fetal monitoring: FHR: 120-130s bpm, variability: moderate,  Accelerations: Present,  decelerations:  Absent Uterine activity: Frequency: Every 2-5 minutes Dilation: 5 Effacement (%): 50 Station: -3 Exam by:: Leanora Cover, MD Presentation: cephalic   Prenatal labs: ABO, Rh: --/--/AB POS (10/31 0017) Antibody: NEG (10/31 0017) Rubella: 7.73 (05/02 1203) RPR: Non Reactive (08/14 0845)  HBsAg: Negative (05/02 1203)  HIV: Non Reactive (08/14  9604)  GBS: Negative/-- (10/16 0208)  2hr GTT: 71/113/103  Prenatal Transfer Tool  Maternal Diabetes: No Genetic Screening: Normal Maternal Ultrasounds/Referrals: Fetal Kidney Anomalies- mild right RPD 8.40mm Fetal Ultrasounds or other Referrals:  None Maternal Substance Abuse:  No Significant Maternal Medications:  None Significant Maternal Lab Results: Group B Strep negative  Results for orders placed or performed during the hospital encounter of 05/23/23 (from the past 24 hour(s))  Fern Test   Collection Time: 05/23/23 10:17 PM  Result Value Ref Range   POCT Fern Test Negative = intact amniotic membranes   Fern Test   Collection Time: 05/23/23 11:12 PM  Result Value Ref Range   POCT Fern Test Negative = intact amniotic membranes   CBC   Collection Time: 05/24/23 12:17 AM  Result Value Ref Range   WBC 15.6 (H) 4.0 - 10.5 K/uL   RBC 4.17 3.87 - 5.11 MIL/uL   Hemoglobin 10.3 (L) 12.0 - 15.0 g/dL   HCT 54.0 (L) 98.1 - 19.1 %   MCV 79.1 (L) 80.0 - 100.0 fL   MCH 24.7 (L) 26.0 - 34.0 pg   MCHC 31.2 30.0 - 36.0 g/dL   RDW 47.8 29.5 - 62.1 %   Platelets 269 150 - 400 K/uL   nRBC 0.0 0.0 - 0.2 %  Type and screen   Collection Time: 05/24/23 12:17 AM  Result Value Ref Range   ABO/RH(D) AB POS    Antibody Screen NEG    Sample Expiration      05/27/2023,2359 Performed at [facility], [address], [place], [place] [address]      Assessment:  [redacted]w[redacted]d Aloha Gell  [id number]  Latent labor  Cat 1 FHR  GBS  Negative/-- (10/16 0208)  Plan:  Admit to L&D Once in room, pt changed mind re waterbirth and plans to have an epidural placed instead  Expectant management  Anticipate vag delivery  Peds to f/u with infant mild RPD Consider starting Zoloft PP due to dep/anx and hx of PPD   Plans to breastfeed  Contraception: IUD at [facility] visit   [name] CNM 05/24/2023, 1:15 AM

## 2023-05-24 NOTE — Progress Notes (Signed)
[name], female   DOB: 1995/10/20, 27 y.o.   MRN: [id number]  Comfortable w epidural  VSS, afebrile FHR 115-120s, +accels, no decels Ctx unable to be traced Cx unchanged; AROM for clear/blood-tinged fluid  IUP@38 .6wks Latent labor  Will try to pick up ctx now that she's in a different position Plan to check cx when having symptoms or within 4h Anticipate vag delivery  [name] CNM 05/24/2023 6:20 AM

## 2023-05-25 ENCOUNTER — Other Ambulatory Visit (HOSPITAL_COMMUNITY): Payer: Self-pay

## 2023-05-25 LAB — CBC
HCT: 29.5 % — ABNORMAL LOW (ref 36.0–46.0)
Hemoglobin: 9 g/dL — ABNORMAL LOW (ref 12.0–15.0)
MCH: 24.5 pg — ABNORMAL LOW (ref 26.0–34.0)
MCHC: 30.5 g/dL (ref 30.0–36.0)
MCV: 80.2 fL (ref 80.0–100.0)
Platelets: 239 10*3/uL (ref 150–400)
RBC: 3.68 MIL/uL — ABNORMAL LOW (ref 3.87–5.11)
RDW: 14.5 % (ref 11.5–15.5)
WBC: 12.5 10*3/uL — ABNORMAL HIGH (ref 4.0–10.5)
nRBC: 0 % (ref 0.0–0.2)

## 2023-05-25 MED ORDER — SERTRALINE HCL 50 MG PO TABS
50.0000 mg | ORAL_TABLET | Freq: Every day | ORAL | 1 refills | Status: DC
  Filled 2023-05-25: qty 90, 90d supply, fill #0

## 2023-05-25 MED ORDER — ACETAMINOPHEN 325 MG PO TABS
650.0000 mg | ORAL_TABLET | ORAL | 1 refills | Status: DC | PRN
  Filled 2023-05-25: qty 60, 5d supply, fill #0

## 2023-05-25 MED ORDER — IBUPROFEN 600 MG PO TABS
600.0000 mg | ORAL_TABLET | Freq: Four times a day (QID) | ORAL | 0 refills | Status: DC
  Filled 2023-05-25: qty 30, 8d supply, fill #0

## 2023-05-25 NOTE — Progress Notes (Signed)
Post Partum Day 1 Subjective: 27 y.o. [id number] s/p SVD at [redacted]w[redacted]d was admitted in active labor on 05/23/2023. Labor course of mother was uncomplicated. Reaching all postpartum milestones. No complaints, + flatus, tolerating po intake, ambulating and bleeding moderate. Wants to be discharged today.   Objective: Blood pressure (!) 118/46, pulse 67, temperature 98.2 F (36.8 C), temperature source Oral, resp. rate 18, height 5\' 7"  (1.702 m), weight 126.8 kg, last menstrual period 09/01/2022, SpO2 98%, unknown if currently breastfeeding.  Physical Exam:  General: alert and cooperative Lochia: appropriate Uterine Fundus: firm DVT Evaluation: No evidence of DVT seen on physical exam.  Recent Labs    05/24/23 0017 05/25/23 0430  HGB 10.3* 9.0*  HCT 33.0* 29.5*    Assessment/Plan  27 y.o. [id number] s/p SVD at [redacted]w[redacted]d with fetal R mild RPD PPD #1 doing well, pain well controlled. Routine postpartum care. OOB, ambulated.  Feeding: breastfeed Contraception: will f/u outpatient Ob Anemia - asx  Hgb drop from 10.3--9.0- start po ferrous sulfate.  Secondary diagnosis:  Morbid obesity with BMI of 40.0-44.9, adult (HCC) Depression with anxiety - can restart Zoloft History of postpartum depression - close f/u with Ob at [facility]    Dispo: Discharge home pending baby's discharge.    LOS: 1 day   [facility], Medical Student 05/25/2023, 7:51 AM   [name], female   DOB: 11-Aug-1995, 27 y.o.   MRN: [id number]

## 2023-05-25 NOTE — Progress Notes (Signed)
CSW received consult for hx of Anxiety, Depression and SI attempt.  CSW met with MOB to offer support and complete assessment.  CSW entered the room, introduced herself and explained the reason for the visit. MOB presented bonding/holding the infant and was polite, easy to engage, receptive to meeting with CSW, and appeared forthcoming.  CSW asked MOB about her mental health history. MOB reported being diagnosed with anxiety,depression,OCD and PTSD. MOB reported the diagnosis were due to being in the foster care system and experiecing childhood trauma. MOB reported in the past being in therapy and is interested in resources for this upcoming PP period. CSW provided MOB with a general and PP specialized therapy list for support. MOB reported in the past being prescribed Zoloft and Seroquel; however she discontinued   her use during pregnancy. MOB reported she will begin her regiment of Zoloft for support upon discharge. 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 MOB reported not being able to describe how she was feeling at that time but equated the feeling to being "depressed". MOB reported reaching out to a medical professional for support with her last pregnancy and beginning a medication regiment for support. MOB reported her supports as her mom and grandma. MOB reported no concerns regarding SI attempt; the attempt happened some time ago.    CSW provided education regarding the baby blues period vs. perinatal mood disorders, discussed treatment and gave resources for mental health follow up if concerns arise.  CSW recommends self-evaluation during the postpartum time period using the New Mom Checklist from Postpartum Progress and encouraged MOB to contact a medical professional if symptoms are noted at any time.      CSW asked MOB about the pediatrician that will complete the follow up visits with the infant upon discharge; MOB said  [facility]. MOB reported having all essential items for the infant including a carseat, bassinet and crib for safe sleeping. CSW provided review of Sudden Infant Death Syndrome (SIDS) precautions.    CSW identifies no further need for intervention and no barriers to discharge at this time   Enos Fling, [facility] Clinical Social Worker [phone]

## 2023-05-29 ENCOUNTER — Encounter: Payer: Self-pay | Admitting: Advanced Practice Midwife

## 2023-05-30 ENCOUNTER — Other Ambulatory Visit: Payer: Medicaid Other

## 2023-05-30 ENCOUNTER — Ambulatory Visit (INDEPENDENT_AMBULATORY_CARE_PROVIDER_SITE_OTHER): Payer: Medicaid Other | Admitting: *Deleted

## 2023-05-30 ENCOUNTER — Encounter: Payer: Self-pay | Admitting: *Deleted

## 2023-05-30 ENCOUNTER — Encounter: Payer: Medicaid Other | Admitting: Advanced Practice Midwife

## 2023-05-30 VITALS — BP 117/82 | HR 77 | Ht 67.0 in | Wt 281.0 lb

## 2023-05-30 DIAGNOSIS — O9089 Other complications of the puerperium, not elsewhere classified: Secondary | ICD-10-CM

## 2023-05-30 DIAGNOSIS — R519 Headache, unspecified: Secondary | ICD-10-CM | POA: Diagnosis not present

## 2023-05-30 DIAGNOSIS — Z0130 Encounter for examination of blood pressure without abnormal findings: Secondary | ICD-10-CM

## 2023-05-30 MED ORDER — FUROSEMIDE 20 MG PO TABS: 20.0000 mg | ORAL_TABLET | Freq: Every day | ORAL | 0 refills | Status: DC

## 2023-05-30 NOTE — Addendum Note (Signed)
Addended by: Sharon Seller on: 05/30/2023 01:02 PM   Modules accepted: Orders

## 2023-05-30 NOTE — Progress Notes (Signed)
   NURSE VISIT- BLOOD PRESSURE CHECK  SUBJECTIVE:  Leah Kennedy is a 27 y.o. [phone] female here for BP check. She is postpartum, delivery date 05/24/23     HYPERTENSION ROS:  Pregnant/postpartum:  Severe headaches that don't go away with tylenol/other medicines: Yes  Visual changes (seeing spots/double/blurred vision) Yes  Severe pain under right breast breast or in center of upper chest No  Severe nausea/vomiting No  Taking medicines as instructed not applicable    OBJECTIVE:  BP 117/82 (BP Location: Left Arm, Patient Position: Sitting, Cuff Size: Large)   Pulse 77   Ht 5\' 7"  (1.702 m)   Wt 281 lb (127.5 kg)   LMP 09/01/2022   Breastfeeding Yes   BMI 44.01 kg/m   Appearance alert, well appearing, and in no distress.  ASSESSMENT: Postpartum  blood pressure check  PLAN: Discussed with Dr. Charlotta Newton   Recommendations: check pre-e labs today. Drink caffeine; try Excedrin Migraine.   Follow-up: as scheduled   Malachy Mood  05/30/2023 11:05 AM

## 2023-05-31 LAB — COMPREHENSIVE METABOLIC PANEL
ALT: 26 [IU]/L (ref 0–32)
AST: 24 [IU]/L (ref 0–40)
Albumin: 3.2 g/dL — ABNORMAL LOW (ref 4.0–5.0)
Alkaline Phosphatase: 95 [IU]/L (ref 44–121)
BUN/Creatinine Ratio: 13 (ref 9–23)
BUN: 8 mg/dL (ref 6–20)
Bilirubin Total: 0.3 mg/dL (ref 0.0–1.2)
CO2: 23 mmol/L (ref 20–29)
Calcium: 8.4 mg/dL — ABNORMAL LOW (ref 8.7–10.2)
Chloride: 108 mmol/L — ABNORMAL HIGH (ref 96–106)
Creatinine, Ser: 0.63 mg/dL (ref 0.57–1.00)
Globulin, Total: 2.4 g/dL (ref 1.5–4.5)
Glucose: 78 mg/dL (ref 70–99)
Potassium: 4 mmol/L (ref 3.5–5.2)
Sodium: 142 mmol/L (ref 134–144)
Total Protein: 5.6 g/dL — ABNORMAL LOW (ref 6.0–8.5)
eGFR: 126 mL/min/{1.73_m2} (ref >59)

## 2023-05-31 LAB — CBC
Hematocrit: 32.1 % — ABNORMAL LOW (ref 34.0–46.6)
Hemoglobin: 9.8 g/dL — ABNORMAL LOW (ref 11.1–15.9)
MCH: 24.9 pg — ABNORMAL LOW (ref 26.6–33.0)
MCHC: 30.5 g/dL — ABNORMAL LOW (ref 31.5–35.7)
MCV: 82 fL (ref 79–97)
Platelets: 323 10*3/uL (ref 150–450)
RBC: 3.93 x10E6/uL (ref 3.77–5.28)
RDW: 14.9 % (ref 11.7–15.4)
WBC: 6.9 10*3/uL (ref 3.4–10.8)

## 2023-05-31 LAB — PROTEIN / CREATININE RATIO, URINE
Creatinine, Urine: 99.2 mg/dL
Protein, Ur: 10.5 mg/dL
Protein/Creat Ratio: 106 mg/g{creat} (ref 0–200)

## 2023-06-01 ENCOUNTER — Inpatient Hospital Stay (HOSPITAL_COMMUNITY): Admission: RE | Admit: 2023-06-01 | Payer: Medicaid Other | Source: Home / Self Care | Admitting: Family Medicine

## 2023-06-01 ENCOUNTER — Inpatient Hospital Stay (HOSPITAL_COMMUNITY): Payer: Medicaid Other

## 2023-06-07 ENCOUNTER — Ambulatory Visit: Payer: Medicaid Other | Admitting: Women's Health

## 2023-06-07 ENCOUNTER — Encounter: Payer: Self-pay | Admitting: Women's Health

## 2023-06-07 VITALS — BP 130/84 | HR 78 | Ht 67.0 in | Wt 273.0 lb

## 2023-06-07 DIAGNOSIS — Z3009 Encounter for other general counseling and advice on contraception: Secondary | ICD-10-CM

## 2023-06-07 DIAGNOSIS — F418 Other specified anxiety disorders: Secondary | ICD-10-CM | POA: Diagnosis not present

## 2023-06-07 NOTE — Progress Notes (Signed)
   GYN VISIT Patient name: Leah Kennedy MRN [id number]  Date of birth: February 12, 1996 Chief Complaint:   Follow-up (PP depression check)  History of Present Illness:   Leah Kennedy is a 27 y.o. [phone] female 2wks s/p SVB being seen today for ppd check.  H/O dep/anx, currently on zoloft 50mg  daily, doing well. EPDS today 4. Breastfeeding, good supply. Plans IUD at pp visit.     Patient's last menstrual period was 09/01/2022.    11/23/2022   11:25 AM 03/24/2022   12:19 PM 12/22/2021   12:28 PM 06/20/2021   11:22 AM 03/01/2021    8:55 AM  Depression screen PHQ 2/9  Decreased Interest 3 2 2 1 2   Down, Depressed, Hopeless 3 2 2 1 2   PHQ - 2 Score 6 4 4 2 4   Altered sleeping 3 3 3 3 2   Tired, decreased energy 3 2 3  0 2  Change in appetite 3 2 3  0 2  Feeling bad or failure about yourself  3 2 2  0 2  Trouble concentrating 3 2 2  0 2  Moving slowly or fidgety/restless 3 2 2  0 0  Suicidal thoughts 0 2 1 0 0  PHQ-9 Score 24 19 20 5 14         11/23/2022   11:25 AM 03/24/2022   12:21 PM 06/20/2021   11:23 AM 03/01/2021    8:56 AM  GAD 7 : Generalized Anxiety Score  Nervous, Anxious, on Edge 3 2 0 2  Control/stop worrying 3 2 0 2  Worry too much - different things 3 2 0 2  Trouble relaxing 3 2 1 2   Restless 3 2 0 2  Easily annoyed or irritable 2 1 0 2  Afraid - awful might happen 2 0 0 2  Total GAD 7 Score 19 11 1 14      Review of Systems:   Pertinent items are noted in HPI Denies fever/chills, dizziness, headaches, visual disturbances, fatigue, shortness of breath, chest pain, abdominal pain, vomiting, abnormal vaginal discharge/itching/odor/irritation, problems with periods, bowel movements, urination, or intercourse unless otherwise stated above.  Pertinent History Reviewed:  Reviewed past medical,surgical, social, obstetrical and family history.  Reviewed problem list, medications and allergies. Physical Assessment:   Vitals:   06/07/23 1418  BP: 130/84  Pulse: 78  Weight: 273 lb  (123.8 kg)  Height: 5\' 7"  (1.702 m)  Body mass index is 42.76 kg/m.       Physical Examination:   General appearance: alert, well appearing, and in no distress  Mental status: alert, oriented to person, place, and time  Skin: warm & dry   Cardiovascular: normal heart rate noted  Respiratory: normal respiratory effort, no distress  Abdomen: soft, non-tender   Pelvic: examination not indicated  Extremities: no edema   Chaperone: N/A    No results found for this or any previous visit (from the past 24 hour(s)).  Assessment & Plan:  1) 2wk s/p SVB> breastfeeding  2) Dep/anx> doing well on zoloft 50mg   3) Contraception counseling> abstinence until after IUD insertion at pp visit  Meds: No orders of the defined types were placed in this encounter.   No orders of the defined types were placed in this encounter.   Return for As scheduled.  Cheral Marker CNM, [facility] 06/07/2023 2:42 PM

## 2023-06-15 ENCOUNTER — Telehealth: Payer: Self-pay | Admitting: *Deleted

## 2023-06-15 NOTE — Telephone Encounter (Signed)
Leah Kennedy from [facility] called. Pt scored an 8 on [place] screen. No other issues were noted. I called pt and she reported she was good. Pt was advised to let us know if anything changed. Pt voiced understanding. JSY

## 2023-06-19 ENCOUNTER — Encounter: Payer: Self-pay | Admitting: Women's Health

## 2023-07-05 ENCOUNTER — Ambulatory Visit: Payer: Medicaid Other | Admitting: Advanced Practice Midwife

## 2023-07-31 ENCOUNTER — Telehealth: Payer: Self-pay | Admitting: *Deleted

## 2023-07-31 NOTE — Telephone Encounter (Signed)
 She needs 2 copies of her tb test & flu vaccination. Please advise.

## 2023-07-31 NOTE — Telephone Encounter (Signed)
 2 copies printed and patient made aware.

## 2023-08-01 ENCOUNTER — Emergency Department (HOSPITAL_COMMUNITY): Payer: Medicaid Other

## 2023-08-01 ENCOUNTER — Encounter (HOSPITAL_COMMUNITY): Payer: Self-pay

## 2023-08-01 ENCOUNTER — Other Ambulatory Visit: Payer: Self-pay

## 2023-08-01 ENCOUNTER — Emergency Department (HOSPITAL_COMMUNITY)
Admission: EM | Admit: 2023-08-01 | Discharge: 2023-08-01 | Disposition: A | Discharge status: Home / Self Care | Payer: Medicaid Other | Attending: Student | Admitting: Student

## 2023-08-01 DIAGNOSIS — J101 Influenza due to other identified influenza virus with other respiratory manifestations: Secondary | ICD-10-CM | POA: Insufficient documentation

## 2023-08-01 DIAGNOSIS — R059 Cough, unspecified: Secondary | ICD-10-CM | POA: Diagnosis present

## 2023-08-01 DIAGNOSIS — Z20822 Contact with and (suspected) exposure to covid-19: Secondary | ICD-10-CM | POA: Insufficient documentation

## 2023-08-01 LAB — RESP PANEL BY RT-PCR (RSV, FLU A&B, COVID)  RVPGX2
Influenza A by PCR: POSITIVE — AB
Influenza B by PCR: NEGATIVE
Resp Syncytial Virus by PCR: NEGATIVE
SARS Coronavirus 2 by RT PCR: NEGATIVE

## 2023-08-01 MED ORDER — OSELTAMIVIR PHOSPHATE 75 MG PO CAPS: 75.0000 mg | ORAL_CAPSULE | Freq: Two times a day (BID) | ORAL | 0 refills | Status: DC

## 2023-08-01 MED ORDER — IBUPROFEN 400 MG PO TABS
600.0000 mg | ORAL_TABLET | Freq: Once | ORAL | Status: AC
  Administered 2023-08-01: 600 mg via ORAL
  Filled 2023-08-01: qty 2

## 2023-08-01 MED ORDER — ONDANSETRON 4 MG PO TBDP: 4.0000 mg | ORAL_TABLET | Freq: Four times a day (QID) | ORAL | 0 refills | Status: DC | PRN

## 2023-08-01 MED ORDER — BENZONATATE 100 MG PO CAPS: 100.0000 mg | ORAL_CAPSULE | Freq: Three times a day (TID) | ORAL | 0 refills | Status: DC

## 2023-08-01 MED ORDER — ACETAMINOPHEN 500 MG PO TABS
1000.0000 mg | ORAL_TABLET | Freq: Once | ORAL | Status: AC
  Administered 2023-08-01: 1000 mg via ORAL
  Filled 2023-08-01: qty 2

## 2023-08-01 NOTE — ED Triage Notes (Signed)
 Pt c/o congestion, cough, SOB, body aches, diarrhea since yesterday.

## 2023-08-01 NOTE — ED Provider Notes (Signed)
 [place] EMERGENCY DEPARTMENT AT [facility] Provider Note   CSN: [id number] Arrival date & time: 08/01/23  9074     History  Chief Complaint  Patient presents with   Cough   Shortness of Breath   Nasal Congestion   Generalized Body Aches    Leah Kennedy is a 28 y.o. female.  He has PMH of anxiety, presents the ER today for cough, congestion, body aches and fever that started yesterday.  States her children were recently sick but they are better now, also reports that her grandmother is still getting over stomach bug.  She also has had diarrhea.   Cough Associated symptoms: shortness of breath   Shortness of Breath Associated symptoms: cough        Home Medications Prior to Admission medications   Medication Sig Start Date End Date Taking? Authorizing Provider  acetaminophen  (TYLENOL ) 325 MG tablet Take 2 tablets (650 mg total) by mouth every 4 (four) hours as needed (for pain scale < 4). Patient not taking: Reported on 06/07/2023 05/25/23   Kandis Devaughn Sayres, MD  Ascorbic Acid (VITAMIN C PO) Take by mouth.    [provider]  benzonatate  (TESSALON ) 100 MG capsule Take 1 capsule (100 mg total) by mouth every 8 (eight) hours. 08/01/23   Tylerjames Hoglund A, PA-C  BIOTIN PO Take by mouth.    [provider]  furosemide  (LASIX ) 20 MG tablet Take 1 tablet (20 mg total) by mouth daily for 5 days. 05/30/23 06/04/23  Ozan, Jennifer, DO  ibuprofen  (ADVIL ) 600 MG tablet Take 1 tablet (600 mg total) by mouth every 6 (six) hours. Patient not taking: Reported on 06/07/2023 05/25/23   Wouk, Devaughn Sayres, MD  ondansetron  (ZOFRAN -ODT) 4 MG disintegrating tablet Take 1 tablet (4 mg total) by mouth every 6 (six) hours as needed for nausea or vomiting. 08/01/23   Suellen Cantor A, PA-C  oseltamivir  (TAMIFLU ) 75 MG capsule Take 1 capsule (75 mg total) by mouth every 12 (twelve) hours. 08/01/23   Suellen Cantor A, PA-C  Prenatal MV & Min w/FA-DHA (PRENATAL GUMMIES PO)  Take 0.5 tablets by mouth 2 (two) times daily.    [provider]  sertraline  (ZOLOFT ) 50 MG tablet Take 1 tablet (50 mg total) by mouth daily. 05/25/23   Wouk, Devaughn Sayres, MD  VITAMIN D PO Take by mouth.    [provider]      Allergies    Carrot [daucus carota] and Kiwi extract    Review of Systems   Review of Systems  Respiratory:  Positive for cough and shortness of breath.     Physical Exam Updated Vital Signs BP 122/82   Pulse (!) 128   Temp (!) 102.1 F (38.9 C) (Oral)   Resp 17   Ht 5' 7 (1.702 m)   Wt 123.8 kg   LMP 07/02/2023   SpO2 97%   Breastfeeding No   BMI 42.75 kg/m  Physical Exam Vitals and nursing note reviewed.  Constitutional:      General: She is not in acute distress.    Appearance: She is well-developed.  HENT:     Head: Normocephalic and atraumatic.  Eyes:     Conjunctiva/sclera: Conjunctivae normal.  Cardiovascular:     Rate and Rhythm: Normal rate and regular rhythm.     Heart sounds: No murmur heard. Pulmonary:     Effort: Pulmonary effort is normal. No respiratory distress.     Breath sounds: Normal breath sounds.  Abdominal:  Palpations: Abdomen is soft.     Tenderness: There is no abdominal tenderness.  Musculoskeletal:        General: No swelling. Normal range of motion.     Cervical back: Neck supple.  Skin:    General: Skin is warm and dry.     Capillary Refill: Capillary refill takes less than 2 seconds.  Neurological:     General: No focal deficit present.     Mental Status: She is alert and oriented to person, place, and time.  Psychiatric:        Mood and Affect: Mood normal.     ED Results / Procedures / Treatments   Labs (all labs ordered are listed, but only abnormal results are displayed) Labs Reviewed  RESP PANEL BY RT-PCR (RSV, FLU A&B, COVID)  [id number] - Abnormal; Notable for the following components:      Result Value   Influenza A by PCR POSITIVE (*)    All other components within  normal limits    EKG None  Radiology DG Chest 2 View Result Date: 08/01/2023 CLINICAL DATA:  cough, fever EXAM: CHEST - 2 VIEW COMPARISON:  CXR 04/30/21 FINDINGS: No pleural effusion. No pneumothorax. Normal cardiac and mediastinal contours. No focal airspace opacity. No radiographically apparent displaced rib fractures. Visualized upper abdomen is unremarkable. Vertebral body heights are maintained. IMPRESSION: No focal airspace opacity Electronically Signed   By: Lyndall Gore M.D.   On: 08/01/2023 10:13    Procedures Procedures    Medications Ordered in ED Medications  ibuprofen  (ADVIL ) tablet 600 mg (has no administration in time range)  acetaminophen  (TYLENOL ) tablet 1,000 mg (1,000 mg Oral Given 08/01/23 0959)    ED Course/ Medical Decision Making/ A&P Clinical Course as of 08/01/23 1057  Wed Aug 01, 2023  1040 Patient presents to ER with symptoms consistent with viral type illness.  Had sudden onset cough, congestion, body aches and chills.  Noted to be febrile and tachycardic but nontoxic in appearance.  Heart rate improved from 1 28-1 02 with Tylenol .  Chest x-ray shows no pulmonary edema, no infiltrates.  Pending viral testing.  Patient given oral rehydration given current fluid shortage. [CB]    Clinical Course User Index [CB] Suellen Sherran LABOR, PA-C                                 Medical Decision Making This patient presents to the ED for concern of cough, congestion, body aches, chills and fever, diarrhea, this involves an extensive number of treatment options, and is a complaint that carries with it a high risk of complications and morbidity.  The differential diagnosis includes influenza, COVID-19, other viral illness, gastroenteritis, dehydration, less likely sepsis, pneumonia, otitis, other   Co morbidities that complicate the patient evaluation  None   Additional history obtained:  Additional history obtained from EMR External records from outside source  obtained and reviewed including prior notes   Lab Tests:  I Ordered, and personally interpreted labs.  The pertinent results include: COVID flu and RSV swab positive for influenza A   Imaging Studies ordered:  I ordered imaging studies including Chest Xray  I independently visualized and interpreted imaging which showed no pulmonary edema or infiltrates I agree with the radiologist interpretation     Problem List / ED Course / Critical interventions / Medication management    Cough and congestion with fever and diarrhea-likely viral illness based on  initial presentation.  Patient is nontoxic in appearance.  Did consider sepsis given positive SIRS vital signs but given presentation and nontoxic-appearing and lack of comorbid medical conditions I think this is less likely so we will start with chest x-ray and viral testing.  Chest x-ray reassuring, patient heart rate improved from 128-102 after Tylenol  only, she is drinking fluids now, also given ibuprofen , still has a cough there is no wheezing rales or rhonchi on exam.  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  Discussed risk and benefits of this.   I ordered medication including tylenol   for fever  Reevaluation of the patient after these medicines showed that the patient improved I have reviewed the patients home medicines and have made adjustments as needed     Amount and/or Complexity of Data Reviewed Radiology: ordered.  Risk OTC drugs. Prescription drug management.           Final Clinical Impression(s) / ED Diagnoses Final diagnoses:  Influenza A    Rx / DC Orders ED Discharge Orders           Ordered    oseltamivir  (TAMIFLU ) 75 MG capsule  Every 12 hours,   Status:  Discontinued        08/01/23 1053    benzonatate  (TESSALON ) 100 MG capsule  Every 8 hours,   Status:  Discontinued        08/01/23 1054    ondansetron  (ZOFRAN -ODT) 4 MG disintegrating tablet  Every 6 hours PRN,   Status:  Discontinued        08/01/23 1054    ondansetron  (ZOFRAN -ODT) 4 MG disintegrating tablet  Every 6 hours PRN        08/01/23 1055    benzonatate  (TESSALON ) 100 MG capsule  Every 8 hours        08/01/23 1055    oseltamivir  (TAMIFLU ) 75 MG capsule  Every 12 hours        08/01/23 [address] PA-C 08/01/23 1057    Kommor, Lum, MD 08/01/23 1755

## 2023-08-01 NOTE — Discharge Instructions (Addendum)
 It was a pleasure taking care of you today.  You are seen in the ER for fever, cough and congestion.  Your nasal swab was positive for influenza A, your chest x-ray does not show any pneumonia.  Drink plenty of fluids, rest, alternate Tylenol  and ibuprofen  over-the-counter as directed on packaging as needed for fever and bodyaches.  I am prescribing Tamiflu  which can help decrease symptom duration by about a day.  It can have side effects, notably some nausea and diarrhea.  I am prescribing Zofran  in case you have nausea and also Tessalon  Perles to help with the cough.  He is come back to the ER if you have new or worsening symptoms.

## 2023-08-03 ENCOUNTER — Other Ambulatory Visit (HOSPITAL_COMMUNITY)
Admission: RE | Admit: 2023-08-03 | Discharge: 2023-08-03 | Disposition: A | Discharge status: Home / Self Care | Payer: Medicaid Other | Source: Ambulatory Visit | Attending: Advanced Practice Midwife | Admitting: Advanced Practice Midwife

## 2023-08-03 ENCOUNTER — Encounter: Payer: Self-pay | Admitting: Advanced Practice Midwife

## 2023-08-03 ENCOUNTER — Ambulatory Visit (INDEPENDENT_AMBULATORY_CARE_PROVIDER_SITE_OTHER): Payer: Medicaid Other | Admitting: Advanced Practice Midwife

## 2023-08-03 DIAGNOSIS — Z3202 Encounter for pregnancy test, result negative: Secondary | ICD-10-CM

## 2023-08-03 DIAGNOSIS — Z30430 Encounter for insertion of intrauterine contraceptive device: Secondary | ICD-10-CM | POA: Insufficient documentation

## 2023-08-03 DIAGNOSIS — Z6841 Body Mass Index (BMI) 40.0 and over, adult: Secondary | ICD-10-CM

## 2023-08-03 DIAGNOSIS — Z113 Encounter for screening for infections with a predominantly sexual mode of transmission: Secondary | ICD-10-CM | POA: Insufficient documentation

## 2023-08-03 DIAGNOSIS — F418 Other specified anxiety disorders: Secondary | ICD-10-CM | POA: Diagnosis not present

## 2023-08-03 LAB — POCT URINE PREGNANCY: Preg Test, Ur: NEGATIVE

## 2023-08-03 MED ORDER — LEVONORGESTREL 20 MCG/DAY IU IUD
1.0000 | INTRAUTERINE_SYSTEM | Freq: Once | INTRAUTERINE | Status: AC
  Administered 2023-08-03: 1 via INTRAUTERINE

## 2023-08-03 NOTE — Addendum Note (Signed)
 Addended by: Federico Flake A on: 08/03/2023 10:02 AM   Modules accepted: Orders

## 2023-08-03 NOTE — Progress Notes (Signed)
 POSTPARTUM VISIT Patient name: Leah Kennedy MRN [id number]  Date of birth: 05-Jun-1996 Chief Complaint:   Postpartum Care (IUD insertion)  History of Present Illness:   Leah Kennedy is a 28 y.o. [phone] African American female being seen today for a postpartum visit. She is 10 weeks postpartum following a spontaneous vaginal delivery at 38.6 gestational weeks. IOL: no. Anesthesia: epidural.  Laceration: none.  Complications: none. Inpatient contraception: no.   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 Tobacco use: former . Substance use disorder: no. Last pap smear: May 2024 and results were ASCUS w/ HRHPV negative. Next pap smear due: May 2027 Patient's last menstrual period was 08/01/2023.  Postpartum course has been uncomplicated. Bleeding flow about like a period. Bowel function is normal. Bladder function is normal. Urinary incontinence? no, fecal incontinence? no Patient is not sexually active. Last sexual activity: prior to birth of baby. Desired contraception: IUD. Patient does not want a pregnancy in the future.  Desired family size is 4 children.   Upstream - 08/03/23 0901       Pregnancy Intention Screening   Does the patient want to become pregnant in the next year? No    Does the patient's partner want to become pregnant in the next year? No    Would the patient like to discuss contraceptive options today? Yes      Contraception Wrap Up   Current Method IUD or IUS    End Method IUD or IUS    Contraception Counseling Provided Yes            The pregnancy intention screening data noted above was reviewed. Potential methods of contraception were discussed. The patient elected to proceed with IUD or IUS.  Edinburgh Postpartum Depression Screening: positive: H/O mental health disorder: yes dep/anx. Currently on meds: no.  Currently in therapy: no.  Sleeping: as expected w NB.  Appetite: nl.  Still finds joy in things she used to: Yes.  Support at home:  yes, GM.  SI/HI/II: no.  Interested in medicine: No.  Interested in therapy: No.  Edinburgh Postnatal Depression Scale - 08/03/23 0900       Edinburgh Postnatal Depression Scale:  In the Past 7 Days   I have been able to laugh and see the funny side of things. 2    I have looked forward with enjoyment to things. 1    I have blamed myself unnecessarily when things went wrong. 2    I have been anxious or worried for no good reason. 2    I have felt scared or panicky for no good reason. 2    Things have been getting on top of me. 2    I have been so unhappy that I have had difficulty sleeping. 2    I have felt sad or miserable. 2    I have been so unhappy that I have been crying. 2    The thought of harming myself has occurred to me. 1    Edinburgh Postnatal Depression Scale Total 18                11/23/2022   11:25 AM 03/24/2022   12:21 PM 06/20/2021   11:23 AM 03/01/2021    8:56 AM  GAD 7 : Generalized Anxiety Score  Nervous, Anxious, on Edge 3 2 0 2  Control/stop worrying 3 2 0 2  Worry too much - different things 3 2 0 2  Trouble relaxing 3 2 1  2  Restless 3 2 0 2  Easily annoyed or irritable 2 1 0 2  Afraid - awful might happen 2 0 0 2  Total GAD 7 Score 19 11 1 14      Baby's course has been complicated by jaundice x 1 month, better now . Baby is feeding by bottle. Infant has a pediatrician/family doctor? Yes.  Childcare strategy if returning to work/school: family.  Pt has material needs met for her and baby: Yes.   Review of Systems:   Pertinent items are noted in HPI Denies Abnormal vaginal discharge w/ itching/odor/irritation, headaches, visual changes, shortness of breath, chest pain, abdominal pain, severe nausea/vomiting, or problems with urination or bowel movements. Pertinent History Reviewed:  Reviewed past medical,surgical, obstetrical and family history.  Reviewed problem list, medications and allergies. OB History  Gravida Para Term Preterm AB Living  6 4 4   2 4   SAB IAB Ectopic Multiple Live Births  2   0 4    # Outcome Date GA Lbr Len/2nd Weight Sex Type Anes PTL Lv  6 Term 05/24/23 [redacted]w[redacted]d / 00:01 7 lb 10.4 oz (3.47 kg) F Vag-Spont EPI  LIV  5 SAB 06/2022          4 Term 09/03/21 [redacted]w[redacted]d 02:00 / 00:04 7 lb 2 oz (3.232 kg) F Vag-Spont EPI  LIV     Birth Comments: none  3 Term 10/18/20 [redacted]w[redacted]d / 00:11 6 lb 9.1 oz (2.98 kg) M Vag-Spont EPI  LIV  2 Term 01/17/19 [redacted]w[redacted]d 10:35 / 00:18 7 lb 5.8 oz (3.34 kg) M Vag-Spont EPI N LIV  1 SAB 05/2017           Physical Assessment:   Vitals:   08/03/23 0838  BP: 125/82  Pulse: (!) 104  Weight: 263 lb (119.3 kg)  Height: 5' 6 (1.676 m)  Body mass index is 42.45 kg/m.       Physical Examination:   General appearance: alert, well appearing, and in no distress  Mental status: alert, oriented to person, place, and time  Skin: warm & dry   Cardiovascular: normal heart rate noted   Respiratory: normal respiratory effort, no distress   Breasts: deferred, no complaints   Abdomen: soft, non-tender   Pelvic: normal external genitalia, vulva, vagina, cervix, uterus and adnexa. Thin prep pap obtained: No; mod bleeding w cycle currently  Rectal: not examined  Extremities: Edema: none        Results for orders placed or performed in visit on 08/03/23 (from the past 24 hours)  POCT urine pregnancy   Collection Time: 08/03/23  9:02 AM  Result Value Ref Range   Preg Test, Ur Negative Negative     IUD INSERTION The risks and benefits of the method and placement have been thouroughly reviewed with the patient and all questions were answered.  Specifically the patient is aware of failure rate of 07/998, expulsion of the IUD and of possible perforation.  The patient is aware of irregular bleeding due to the method and understands the incidence of irregular bleeding diminishes with time.  Signed copy of informed consent in chart.   Time out was performed.  A Graves speculum was placed in the vagina.  The cervix  was visualized, prepped using Betadine, and grasped with a single tooth tenaculum. The uterus was found to be retroflexed and it sounded to 7 cm.  Mirena   IUD placed per manufacturer's recommendations. The strings were trimmed to approximately 3 cm. The patient tolerated the procedure  well.   Informal transvaginal sonogram was performed and the proper placement of the IUD was verified.  Chaperone: Office Manager & Plan:  1) Postpartum exam 2) Ten wks s/p spontaneous vaginal delivery 3) bottle feeding 4) Depression screening> positive EPDS 18; declines meds, has tried many and doesn't like the way they make her feel; is going to the gym and talking with family/friends for support; doesn't have plan for harm to self or others 5) Mirena  IUD insertion The patient was given post procedure instructions, including signs and symptoms of infection and to check for the strings after each menses or each month, and refraining from intercourse or anything in the vagina for 3 days.  Condoms for 2 weeks. She was given a care card with date IUD placed, and date IUD to be removed.  She is scheduled for a f/u appointment in 4 weeks. 6) Obesity> going to the gym and eating well; wants to talk with JG about phentermine  7) Requests STI screening> CV swab for GC/chlam/trich  Essential components of care per ACOG recommendations:  1.  Mood and well being:  If positive depression screen, discussed and plan developed.  If using tobacco we discussed reduction/cessation and risk of relapse If current substance abuse, we discussed and referral to local resources was offered.   2. Infant care and feeding:  If breastfeeding, discussed returning to work, pumping, breastfeeding-associated pain, guidance regarding return to fertility while lactating if not using another method. If needed, patient was provided with a letter to be allowed to pump q 2-3hrs to support lactation in a private location with access to a  refrigerator to store breastmilk.   Recommended that all caregivers be immunized for flu, pertussis and other preventable communicable diseases If pt does not have material needs met for her/baby, referred to local resources for help obtaining these.  3. Sexuality, contraception and birth spacing Provided guidance regarding sexuality, management of dyspareunia, and resumption of intercourse Discussed avoiding interpregnancy interval <49mths and recommended birth spacing of 18 months  4. Sleep and fatigue Discussed coping options for fatigue and sleep disruption Encouraged family/partner/community support of 4 hrs of uninterrupted sleep to help with mood and fatigue  5. Physical recovery  If pt had a C/S, assessed incisional pain and providing guidance on normal vs prolonged recovery If pt had a laceration, perineal healing and pain reviewed.  If urinary or fecal incontinence, discussed management and referred to PT or uro/gyn if indicated  Patient is safe to resume physical activity. Discussed attainment of healthy weight.  6.  Chronic disease management Discussed pregnancy complications if any, and their implications for future childbearing and long-term maternal health. Review recommendations for prevention of recurrent pregnancy complications, such as 17 hydroxyprogesterone caproate to reduce risk for recurrent PTB not applicable, or aspirin  to reduce risk of preeclampsia no. Pt had GDM: No. If yes, 2hr GTT scheduled: not applicable. Reviewed medications and non-pregnant dosing including consideration of whether pt is breastfeeding using a reliable resource such as LactMed: not applicable Referred for f/u w/ PCP or subspecialist providers as indicated: not applicable  7. Health maintenance Mammogram at 26yo or earlier if indicated Pap smears as indicated  Meds: No orders of the defined types were placed in this encounter.   Follow-up: No follow-ups on file.   Orders Placed This  Encounter  Procedures   POCT urine pregnancy    Suzen JONETTA Gentry [facility] ([place] 08/03/2023 9:05 AM

## 2023-08-03 NOTE — Patient Instructions (Signed)
 Nothing in vagina for 3 days (no sex, douching, tampons, etc...) Check your strings once a month to make sure you can feel them, if you are not able to please let us know If you develop a fever of 100.4 or more in the next few weeks, or if you develop severe abdominal pain, please let [place] know Use a backup method of birth control, such as condoms, for 2 weeks

## 2023-08-06 LAB — CERVICOVAGINAL ANCILLARY ONLY
Bacterial Vaginitis (gardnerella): NEGATIVE
Candida Glabrata: NEGATIVE
Candida Vaginitis: NEGATIVE
Chlamydia: NEGATIVE
Comment: NEGATIVE
Comment: NEGATIVE
Comment: NEGATIVE
Comment: NEGATIVE
Comment: NEGATIVE
Comment: NORMAL
Neisseria Gonorrhea: NEGATIVE
Trichomonas: NEGATIVE

## 2023-08-16 ENCOUNTER — Encounter: Payer: Self-pay | Admitting: Women's Health

## 2023-08-31 ENCOUNTER — Ambulatory Visit: Payer: Medicaid Other | Admitting: Adult Health

## 2023-08-31 ENCOUNTER — Encounter: Payer: Self-pay | Admitting: Adult Health

## 2023-08-31 VITALS — BP 104/69 | HR 69 | Ht 66.5 in | Wt 268.5 lb

## 2023-08-31 DIAGNOSIS — Z713 Dietary counseling and surveillance: Secondary | ICD-10-CM

## 2023-08-31 DIAGNOSIS — Z6841 Body Mass Index (BMI) 40.0 and over, adult: Secondary | ICD-10-CM

## 2023-08-31 DIAGNOSIS — Z30431 Encounter for routine checking of intrauterine contraceptive device: Secondary | ICD-10-CM | POA: Diagnosis not present

## 2023-08-31 MED ORDER — PHENTERMINE HCL 37.5 MG PO TABS: 37.5000 mg | ORAL_TABLET | Freq: Every day | ORAL | 0 refills | Status: DC

## 2023-08-31 NOTE — Progress Notes (Addendum)
  Subjective:     [name], female   DOB: 1996-07-09, 28 y.o.   MRN: [id number]  HPI Leah Kennedy is a 28 year old black female, single, [id number] in for IUD check and wants to start phentermine  to help with weight loss, is going to gym now and no sodas. Had vaginal delivery 05/24/23.     Component Value Date/Time   DIAGPAP (A) 11/23/2022 1110    - Atypical squamous cells of undetermined significance (ASC-US )   DIAGPAP  11/19/2019 1613    - Negative for intraepithelial lesion or malignancy (NILM)   HPVHIGH Negative 11/23/2022 1110   ADEQPAP Satisfactory for evaluation.  The absence of an 11/23/2022 1110   ADEQPAP  11/23/2022 1110    endocervical/transformation zone component is not uncommon in pregnant   ADEQPAP patients. 11/23/2022 1110    Review of Systems Bleeding with IUD Nees help with weight loss Reviewed past medical,surgical, social and family history. Reviewed medications and allergies.     Objective:   Physical Exam BP 104/69 (BP Location: Left Arm, Patient Position: Sitting, Cuff Size: Large)   Pulse 69   Ht 5' 6.5 (1.689 m)   Wt 268 lb 8 oz (121.8 kg)   LMP 08/01/2023   Breastfeeding No   BMI 42.69 kg/m     Skin warm and dry.  Lungs: clear to ausculation bilaterally. Cardiovascular: regular rate and rhythm.  Pelvic: external genitalia is normal in appearance no lesions, vagina:+blood,urethra has no lesions or masses noted, cervix:smooth and bulbous, +IUD strings at os, uterus: normal size, shape and contour, non tender, no masses felt, adnexa: no masses or tenderness noted. Bladder is non tender and no masses felt.   Upstream - 08/31/23 1012       Pregnancy Intention Screening   Does the patient want to become pregnant in the next year? No    Does the patient's partner want to become pregnant in the next year? No    Would the patient like to discuss contraceptive options today? No      Contraception Wrap Up   Current Method IUD or IUS    End Method  IUD or IUS    Contraception Counseling Provided Yes            Examination chaperoned by Clarita Salt LPN  Assessment:     1. IUD check up (Primary) Mirena  placed 08/03/23  2. Weight loss counseling, encounter for Increase protein and water Continue to exercise Will rx phentermine   Meds ordered this encounter  Medications   phentermine  (ADIPEX-P ) 37.5 MG tablet    Sig: Take 1 tablet (37.5 mg total) by mouth daily before breakfast. Take 1 daily    Dispense:  30 tablet    Refill:  0    Supervising Provider:   JAYNE MINDER H [[id number]]     3. Morbid obesity with BMI of 40.0-44.9, adult (HCC)     Plan:     Follow up in 4 weeks for weight and BP check with me

## 2023-09-28 ENCOUNTER — Ambulatory Visit: Payer: Medicaid Other | Admitting: Adult Health

## 2023-10-23 ENCOUNTER — Ambulatory Visit: Admitting: Adult Health

## 2023-10-29 ENCOUNTER — Ambulatory Visit: Admitting: Adult Health

## 2023-10-29 ENCOUNTER — Encounter: Payer: Self-pay | Admitting: Adult Health

## 2023-10-29 VITALS — BP 105/67 | HR 75 | Ht 67.0 in | Wt 265.5 lb

## 2023-10-29 DIAGNOSIS — Z6841 Body Mass Index (BMI) 40.0 and over, adult: Secondary | ICD-10-CM

## 2023-10-29 DIAGNOSIS — Z713 Dietary counseling and surveillance: Secondary | ICD-10-CM | POA: Diagnosis not present

## 2023-10-29 MED ORDER — PHENTERMINE HCL 37.5 MG PO TABS: 37.5000 mg | ORAL_TABLET | Freq: Every day | ORAL | 0 refills | Status: DC

## 2023-10-29 NOTE — Progress Notes (Signed)
  Subjective:     [name], female   DOB: Apr 18, 1996, 28 y.o.   MRN: [id number]  HPI Leah Kennedy is a 28 year old black female,single, [id number] in for weight and BP check. She started phentermine in February and took for a month and got busy with school and did not come in March so did not take. She is going to the gym 5 days a week now.      Component Value Date/Time   DIAGPAP (A) 11/23/2022 1110    - Atypical squamous cells of undetermined significance (ASC-US)   DIAGPAP  11/19/2019 1613    - Negative for intraepithelial lesion or malignancy (NILM)   HPVHIGH Negative 11/23/2022 1110   ADEQPAP Satisfactory for evaluation.  The absence of an 11/23/2022 1110   ADEQPAP  11/23/2022 1110    endocervical/transformation zone component is not uncommon in pregnant   ADEQPAP patients. 11/23/2022 1110   Review of Systems She did have some dizzy when first started phentermine for like 30 minutes She is exercising and watching her carbs and sugars Reviewed past medical,surgical, social and family history. Reviewed medications and allergies.     Objective:   Physical Exam BP 105/67 (BP Location: Right Arm, Patient Position: Sitting, Cuff Size: Large)   Pulse 75   Ht 5\' 7"  (1.702 m)   Wt 265 lb 8 oz (120.4 kg)   LMP 10/06/2023   Breastfeeding No   BMI 41.58 kg/m   she has lost 3 lbs since February Skin warm and dry.  Lungs: clear to ausculation bilaterally. Cardiovascular: regular rate and rhythm.      Upstream - 10/29/23 1115       Pregnancy Intention Screening   Does the patient want to become pregnant in the next year? No    Does the patient's partner want to become pregnant in the next year? No    Would the patient like to discuss contraceptive options today? No      Contraception Wrap Up   Current Method IUD or IUS    End Method IUD or IUS    Contraception Counseling Provided Yes             Assessment:     1. Weight loss counseling, encounter for  (Primary) Continue going to gym and drinking more water Will refill phentermine 37.5 mg 1 daily, can try eating some protein before taking Meds ordered this encounter  Medications   phentermine (ADIPEX-P) 37.5 MG tablet    Sig: Take 1 tablet (37.5 mg total) by mouth daily before breakfast. Take 1 daily    Dispense:  30 tablet    Refill:  0    Supervising Provider:   Duane Lope H [[id number]]     2. Body mass index 40.0-44.9, adult (HCC)     Plan:     Follow up in 4 weeks for weight and BP check

## 2023-11-26 ENCOUNTER — Ambulatory Visit: Admitting: Adult Health

## 2023-11-29 ENCOUNTER — Ambulatory Visit: Admitting: Adult Health

## 2024-01-14 ENCOUNTER — Encounter: Payer: Self-pay | Admitting: Women's Health

## 2024-01-30 ENCOUNTER — Ambulatory Visit: Admitting: Women's Health

## 2024-02-06 ENCOUNTER — Ambulatory Visit: Admitting: Advanced Practice Midwife

## 2024-04-02 ENCOUNTER — Ambulatory Visit: Admitting: Advanced Practice Midwife

## 2024-04-07 ENCOUNTER — Encounter: Payer: Self-pay | Admitting: Adult Health

## 2024-04-07 ENCOUNTER — Ambulatory Visit (INDEPENDENT_AMBULATORY_CARE_PROVIDER_SITE_OTHER): Admitting: Adult Health

## 2024-04-07 VITALS — BP 119/78 | HR 91 | Ht 66.5 in | Wt 266.0 lb

## 2024-04-07 DIAGNOSIS — Z6841 Body Mass Index (BMI) 40.0 and over, adult: Secondary | ICD-10-CM

## 2024-04-07 DIAGNOSIS — Z01419 Encounter for gynecological examination (general) (routine) without abnormal findings: Secondary | ICD-10-CM | POA: Diagnosis not present

## 2024-04-07 DIAGNOSIS — Z713 Dietary counseling and surveillance: Secondary | ICD-10-CM

## 2024-04-07 DIAGNOSIS — Z975 Presence of (intrauterine) contraceptive device: Secondary | ICD-10-CM | POA: Diagnosis not present

## 2024-04-07 DIAGNOSIS — F32A Depression, unspecified: Secondary | ICD-10-CM

## 2024-04-07 DIAGNOSIS — F419 Anxiety disorder, unspecified: Secondary | ICD-10-CM

## 2024-04-07 MED ORDER — PHENTERMINE HCL 37.5 MG PO TABS: 37.5000 mg | ORAL_TABLET | Freq: Every day | ORAL | 0 refills | Status: AC

## 2024-04-07 MED ORDER — SERTRALINE HCL 50 MG PO TABS: 50.0000 mg | ORAL_TABLET | Freq: Every day | ORAL | 3 refills | Status: AC

## 2024-04-07 NOTE — Progress Notes (Signed)
 [name], female   DOB: May 06, 1996, 28 y.o.   MRN: [id number] History of Present Illness: Leah Kennedy is a 28 year old black female, with SO, [id number] in for well woman gyn exam and she wants to talk about getting on phentermine  for weight loss and zoloft  for depression and anxiety. Her 38 month old has fluid on the brain and looking at possible surgery. She asked about STD testing, no symptoms,or new partners, can perform if desired, but may not be covered, she declines now.     Component Value Date/Time   DIAGPAP (A) 11/23/2022 1110    - Atypical squamous cells of undetermined significance (ASC-US )   DIAGPAP  11/19/2019 1613    - Negative for intraepithelial lesion or malignancy (NILM)   HPVHIGH Negative 11/23/2022 1110   ADEQPAP Satisfactory for evaluation.  The absence of an 11/23/2022 1110   ADEQPAP  11/23/2022 1110    endocervical/transformation zone component is not uncommon in pregnant   ADEQPAP patients. 11/23/2022 1110      Current Medications, Allergies, Past Medical History, Past Surgical History, Family History and Social History were reviewed in [employer] record.     Review of Systems: Patient denies any headaches, hearing loss, fatigue, blurred vision, shortness of breath, chest pain, abdominal pain, problems with bowel movements, urination, or intercourse. No joint pain or mood swings.  Does not sleep well, may get 3 hours Wants help with weight loss Has some cramping    Physical Exam:BP 119/78 (BP Location: Left Arm, Patient Position: Sitting, Cuff Size: Large)   Pulse 91   Ht 5' 6.5 (1.689 m)   Wt 266 lb (120.7 kg)   LMP 03/31/2024 (Exact Date)   Breastfeeding No   BMI 42.29 kg/m   General:  Well developed, well nourished, no acute distress Skin:  Warm and dry Neck:  Midline trachea, normal thyroid , good ROM, no lymphadenopathy Lungs; Clear to auscultation bilaterally Breast:  No dominant palpable mass, retraction, or  nipple discharge Cardiovascular: Regular rate and rhythm Abdomen:  Soft, non tender, no hepatosplenomegaly Pelvic:  External genitalia is normal in appearance, no lesions.  The vagina is normal in appearance,+blood. Urethra has no lesions or masses. The cervix is bulbous,+strings at os.  Uterus is felt to be normal size, shape, and contour.  No adnexal masses or tenderness noted.Bladder is non tender, no masses felt. Rectal:Deferred  Extremities/musculoskeletal:  No swelling or varicosities noted, no clubbing or cyanosis Psych:  No mood changes, alert and cooperative,seems happy AA is 0 Fall risk is low    04/07/2024    1:49 PM 11/23/2022   11:25 AM 03/24/2022   12:19 PM  Depression screen PHQ 2/9  Decreased Interest 0 3 2  Down, Depressed, Hopeless 2 3 2   PHQ - 2 Score 2 6 4   Altered sleeping 2 3 3   Tired, decreased energy 2 3 2   Change in appetite 2 3 2   Feeling bad or failure about yourself  0 3 2  Trouble concentrating 0 3 2  Moving slowly or fidgety/restless 0 3 2  Suicidal thoughts 0 0 2  PHQ-9 Score 8 24 19        04/07/2024    1:50 PM 11/23/2022   11:25 AM 03/24/2022   12:21 PM 06/20/2021   11:23 AM  GAD 7 : Generalized Anxiety Score  Nervous, Anxious, on Edge 1 3 2  0  Control/stop worrying 0 3 2 0  Worry too much - different things 1 3 2  0  Trouble relaxing 1 3 2 1   Restless 0 3 2 0  Easily annoyed or irritable 1 2 1  0  Afraid - awful might happen 0 2 0 0  Total GAD 7 Score 4 19 11 1       Upstream - 04/07/24 1345       Pregnancy Intention Screening   Does the patient want to become pregnant in the next year? No    Does the patient's partner want to become pregnant in the next year? No    Would the patient like to discuss contraceptive options today? No      Contraception Wrap Up   Current Method IUD or IUS    End Method IUD or IUS    Contraception Counseling Provided Yes         Examination chaperoned by Clarita Salt LPN  Impression and plan: 1. Encounter  for well woman exam with routine gynecological exam (Primary) Pap in 2027 Physical in 1 year Get PCP  2. IUD (intrauterine device) in place Mirena  placed 08/03/23  3. Weight loss counseling, encounter for Going to gym already and drinking water Eat smaller portions Will rx phentermine  37.5 mg 1 daily  Follow up in 4 weeks for weight and BP check  4. Body mass index 40.0-44.9, adult (HCC)  5. Anxiety and depression Will rx zoloft , has used on past She declines BH referral Follow up in 4 weeks for ROS   Meds ordered this encounter  Medications   phentermine  (ADIPEX-P ) 37.5 MG tablet    Sig: Take 1 tablet (37.5 mg total) by mouth daily before breakfast. Take 1 daily    Dispense:  30 tablet    Refill:  0    Supervising Provider:   JAYNE MINDER H [[id number]]   sertraline  (ZOLOFT ) 50 MG tablet    Sig: Take 1 tablet (50 mg total) by mouth daily.    Dispense:  30 tablet    Refill:  3    Supervising Provider:   JAYNE MINDER H [[id number]]

## 2024-04-08 ENCOUNTER — Ambulatory Visit: Admitting: Adult Health

## 2024-05-05 ENCOUNTER — Ambulatory Visit: Admitting: Adult Health

## 2024-06-10 ENCOUNTER — Encounter: Payer: Self-pay | Admitting: Advanced Practice Midwife

## 2024-08-11 ENCOUNTER — Ambulatory Visit: Payer: Self-pay | Admitting: Family Medicine

## 2024-08-15 ENCOUNTER — Ambulatory Visit: Admitting: Physician Assistant

## 2024-11-26 ENCOUNTER — Ambulatory Visit
